# Patient Record
Sex: Female | Born: 1960 | Race: White | Hispanic: No | Marital: Single | Smoking: Never smoker
Health system: Southern US, Community
[De-identification: ages and names within clinical notes are randomized; demographics above are authoritative.]

## PROBLEM LIST (undated history)

## (undated) DIAGNOSIS — K449 Diaphragmatic hernia without obstruction or gangrene: Secondary | ICD-10-CM

## (undated) DIAGNOSIS — K222 Esophageal obstruction: Secondary | ICD-10-CM

## (undated) DIAGNOSIS — F32A Depression, unspecified: Secondary | ICD-10-CM

## (undated) DIAGNOSIS — T8859XA Other complications of anesthesia, initial encounter: Secondary | ICD-10-CM

## (undated) DIAGNOSIS — Z460 Encounter for fitting and adjustment of spectacles and contact lenses: Secondary | ICD-10-CM

## (undated) DIAGNOSIS — F419 Anxiety disorder, unspecified: Secondary | ICD-10-CM

## (undated) DIAGNOSIS — I341 Nonrheumatic mitral (valve) prolapse: Secondary | ICD-10-CM

## (undated) DIAGNOSIS — K589 Irritable bowel syndrome without diarrhea: Secondary | ICD-10-CM

## (undated) DIAGNOSIS — F329 Major depressive disorder, single episode, unspecified: Secondary | ICD-10-CM

## (undated) DIAGNOSIS — IMO0002 Reserved for concepts with insufficient information to code with codable children: Secondary | ICD-10-CM

## (undated) DIAGNOSIS — K644 Residual hemorrhoidal skin tags: Secondary | ICD-10-CM

## (undated) DIAGNOSIS — Z87898 Personal history of other specified conditions: Secondary | ICD-10-CM

## (undated) HISTORY — DX: Major depressive disorder, single episode, unspecified: F32.9

## (undated) HISTORY — DX: Residual hemorrhoidal skin tags: K64.4

## (undated) HISTORY — DX: Irritable bowel syndrome, unspecified: K58.9

## (undated) HISTORY — DX: Esophageal obstruction: K22.2

## (undated) HISTORY — DX: Nonrheumatic mitral (valve) prolapse: I34.1

## (undated) HISTORY — DX: Reserved for concepts with insufficient information to code with codable children: IMO0002

## (undated) HISTORY — PX: TUBAL LIGATION: SHX77

## (undated) HISTORY — DX: Diaphragmatic hernia without obstruction or gangrene: K44.9

## (undated) HISTORY — DX: Depression, unspecified: F32.A

---

## 1997-08-18 HISTORY — PX: LEEP: SHX91

## 1998-02-06 ENCOUNTER — Ambulatory Visit (HOSPITAL_COMMUNITY): Admission: RE | Admit: 1998-02-06 | Discharge: 1998-02-06 | Payer: Self-pay | Admitting: Family Medicine

## 1998-02-14 ENCOUNTER — Ambulatory Visit (HOSPITAL_COMMUNITY): Admission: RE | Admit: 1998-02-14 | Discharge: 1998-02-14 | Payer: Self-pay | Admitting: *Deleted

## 1998-06-20 ENCOUNTER — Other Ambulatory Visit: Admission: RE | Admit: 1998-06-20 | Discharge: 1998-06-20 | Payer: Self-pay | Admitting: *Deleted

## 1998-06-26 ENCOUNTER — Ambulatory Visit (HOSPITAL_COMMUNITY): Admission: RE | Admit: 1998-06-26 | Discharge: 1998-06-26 | Payer: Self-pay | Admitting: Gastroenterology

## 1998-07-13 ENCOUNTER — Other Ambulatory Visit: Admission: RE | Admit: 1998-07-13 | Discharge: 1998-07-13 | Payer: Self-pay | Admitting: *Deleted

## 1998-08-03 ENCOUNTER — Ambulatory Visit (HOSPITAL_COMMUNITY): Admission: RE | Admit: 1998-08-03 | Discharge: 1998-08-03 | Payer: Self-pay | Admitting: *Deleted

## 1998-08-18 DIAGNOSIS — IMO0002 Reserved for concepts with insufficient information to code with codable children: Secondary | ICD-10-CM

## 1998-08-18 DIAGNOSIS — R87619 Unspecified abnormal cytological findings in specimens from cervix uteri: Secondary | ICD-10-CM

## 1998-08-18 HISTORY — DX: Unspecified abnormal cytological findings in specimens from cervix uteri: R87.619

## 1998-08-18 HISTORY — DX: Reserved for concepts with insufficient information to code with codable children: IMO0002

## 1998-09-21 ENCOUNTER — Ambulatory Visit (HOSPITAL_COMMUNITY): Admission: RE | Admit: 1998-09-21 | Discharge: 1998-09-21 | Payer: Self-pay | Admitting: *Deleted

## 1998-09-21 ENCOUNTER — Encounter: Payer: Self-pay | Admitting: *Deleted

## 1998-11-14 ENCOUNTER — Other Ambulatory Visit: Admission: RE | Admit: 1998-11-14 | Discharge: 1998-11-14 | Payer: Self-pay | Admitting: *Deleted

## 1998-12-13 ENCOUNTER — Other Ambulatory Visit: Admission: RE | Admit: 1998-12-13 | Discharge: 1998-12-13 | Payer: Self-pay | Admitting: *Deleted

## 1999-01-11 ENCOUNTER — Ambulatory Visit (HOSPITAL_COMMUNITY): Admission: RE | Admit: 1999-01-11 | Discharge: 1999-01-11 | Payer: Self-pay | Admitting: *Deleted

## 1999-05-30 ENCOUNTER — Other Ambulatory Visit: Admission: RE | Admit: 1999-05-30 | Discharge: 1999-05-30 | Payer: Self-pay | Admitting: *Deleted

## 2000-02-10 ENCOUNTER — Other Ambulatory Visit: Admission: RE | Admit: 2000-02-10 | Discharge: 2000-02-10 | Payer: Self-pay | Admitting: *Deleted

## 2002-03-23 ENCOUNTER — Emergency Department (HOSPITAL_COMMUNITY): Admission: EM | Admit: 2002-03-23 | Discharge: 2002-03-24 | Payer: Self-pay | Admitting: Emergency Medicine

## 2002-06-16 ENCOUNTER — Other Ambulatory Visit: Admission: RE | Admit: 2002-06-16 | Discharge: 2002-06-16 | Payer: Self-pay | Admitting: *Deleted

## 2004-08-18 DIAGNOSIS — I341 Nonrheumatic mitral (valve) prolapse: Secondary | ICD-10-CM

## 2004-08-18 HISTORY — DX: Nonrheumatic mitral (valve) prolapse: I34.1

## 2005-08-14 ENCOUNTER — Other Ambulatory Visit: Admission: RE | Admit: 2005-08-14 | Discharge: 2005-08-14 | Payer: Self-pay | Admitting: Obstetrics and Gynecology

## 2005-08-18 DIAGNOSIS — K222 Esophageal obstruction: Secondary | ICD-10-CM

## 2005-08-18 HISTORY — DX: Esophageal obstruction: K22.2

## 2005-08-18 HISTORY — PX: ESOPHAGEAL DILATION: SHX303

## 2005-09-02 ENCOUNTER — Ambulatory Visit (HOSPITAL_COMMUNITY): Admission: RE | Admit: 2005-09-02 | Discharge: 2005-09-02 | Payer: Self-pay | Admitting: Gastroenterology

## 2005-12-16 HISTORY — PX: CHOLECYSTECTOMY: SHX55

## 2005-12-18 ENCOUNTER — Encounter: Admission: RE | Admit: 2005-12-18 | Discharge: 2005-12-18 | Payer: Self-pay | Admitting: Gastroenterology

## 2006-01-07 ENCOUNTER — Ambulatory Visit (HOSPITAL_COMMUNITY): Admission: RE | Admit: 2006-01-07 | Discharge: 2006-01-07 | Payer: Self-pay | Admitting: Surgery

## 2006-01-07 ENCOUNTER — Encounter (INDEPENDENT_AMBULATORY_CARE_PROVIDER_SITE_OTHER): Payer: Self-pay | Admitting: Pathology

## 2007-10-07 ENCOUNTER — Other Ambulatory Visit: Admission: RE | Admit: 2007-10-07 | Discharge: 2007-10-07 | Payer: Self-pay | Admitting: Obstetrics and Gynecology

## 2008-04-15 ENCOUNTER — Emergency Department (HOSPITAL_COMMUNITY): Admission: EM | Admit: 2008-04-15 | Discharge: 2008-04-16 | Payer: Self-pay | Admitting: Emergency Medicine

## 2008-04-16 ENCOUNTER — Ambulatory Visit: Payer: Self-pay | Admitting: *Deleted

## 2008-04-17 ENCOUNTER — Encounter (INDEPENDENT_AMBULATORY_CARE_PROVIDER_SITE_OTHER): Payer: Self-pay | Admitting: Emergency Medicine

## 2008-04-17 ENCOUNTER — Inpatient Hospital Stay (HOSPITAL_COMMUNITY): Admission: EM | Admit: 2008-04-17 | Discharge: 2008-04-17 | Payer: Self-pay | Admitting: Emergency Medicine

## 2008-04-17 ENCOUNTER — Ambulatory Visit: Payer: Self-pay | Admitting: *Deleted

## 2008-11-27 ENCOUNTER — Encounter: Admission: RE | Admit: 2008-11-27 | Discharge: 2008-11-27 | Payer: Self-pay | Admitting: Internal Medicine

## 2008-12-07 ENCOUNTER — Encounter: Admission: RE | Admit: 2008-12-07 | Discharge: 2008-12-07 | Payer: Self-pay | Admitting: Internal Medicine

## 2009-02-07 ENCOUNTER — Emergency Department (HOSPITAL_COMMUNITY): Admission: EM | Admit: 2009-02-07 | Discharge: 2009-02-07 | Payer: Self-pay | Admitting: Emergency Medicine

## 2010-11-11 ENCOUNTER — Other Ambulatory Visit: Payer: Self-pay | Admitting: Obstetrics & Gynecology

## 2010-11-11 DIAGNOSIS — Z1231 Encounter for screening mammogram for malignant neoplasm of breast: Secondary | ICD-10-CM

## 2010-11-13 ENCOUNTER — Ambulatory Visit
Admission: RE | Admit: 2010-11-13 | Discharge: 2010-11-13 | Disposition: A | Discharge status: Home / Self Care | Payer: BC Managed Care – PPO | Source: Ambulatory Visit | Attending: Obstetrics & Gynecology | Admitting: Obstetrics & Gynecology

## 2010-11-13 DIAGNOSIS — Z1231 Encounter for screening mammogram for malignant neoplasm of breast: Secondary | ICD-10-CM

## 2010-11-25 LAB — DIFFERENTIAL
Basophils Absolute: 0 10*3/uL (ref 0.0–0.1)
Basophils Relative: 0 % (ref 0–1)
Eosinophils Absolute: 0 10*3/uL (ref 0.0–0.7)
Monocytes Relative: 6 % (ref 3–12)
Neutrophils Relative %: 85 % — ABNORMAL HIGH (ref 43–77)

## 2010-11-25 LAB — BASIC METABOLIC PANEL
BUN: 13 mg/dL (ref 6–23)
CO2: 20 mEq/L (ref 19–32)
Calcium: 9.4 mg/dL (ref 8.4–10.5)
Chloride: 108 mEq/L (ref 96–112)
Creatinine, Ser: 0.78 mg/dL (ref 0.4–1.2)
Glucose, Bld: 117 mg/dL — ABNORMAL HIGH (ref 70–99)

## 2010-11-25 LAB — CBC
MCHC: 33.6 g/dL (ref 30.0–36.0)
MCV: 85.4 fL (ref 78.0–100.0)
Platelets: 233 10*3/uL (ref 150–400)
RBC: 4.59 MIL/uL (ref 3.87–5.11)
RDW: 12.8 % (ref 11.5–15.5)

## 2010-12-31 NOTE — H&P (Signed)
NAME:  Nicole Fleming, Nicole Fleming                ACCOUNT NO.:  [id number]   MEDICAL RECORD NO.:  [id number]          PATIENT TYPE:  INP   LOCATION:  4707                         FACILITY:  [facility]   PHYSICIAN:  Lucita Ferrara, MD         DATE OF BIRTH:  1960/10/06   DATE OF ADMISSION:  04/16/2008  DATE OF DISCHARGE:                              HISTORY & PHYSICAL   CHIEF COMPLAINT:  Chest pain, shortness of breath and palpitations.  The  patient is a 50 year old Caucasian female who presents to the [facility] with a chief complaint of wheezing, cough, shortness of  breath.  Sometimes this apparently has been going on for the last 2  days.  The patient presented to her primary care physician and [facility] several days ago, where she was started on albuterol  treatments and also on Zithromax.  The patient's symptoms have  progressed and have not responded to current treatment.  She denies any  fevers or chills.  She denies nausea, vomiting.  Chest pain not  reproducible.  She has no cardiac risk factors with the exception of  mitral valve prolapse.  She denies headaches or focal neurological  deficit; otherwise 12-point review of systems is negative.  In the  emergency room she did have several courses of prednisone and albuterol,  with no relief of her symptoms.   PAST MEDICAL HISTORY:  Significant for asthmatic bronchitis.  She is a  nonsmoker.  She has never been intubated or ventilated in the past for  asthma exacerbations.   PAST SURGICAL HISTORY:  None.   SOCIAL HISTORY:  Denies drugs, alcohol or tobacco.   ALLERGIES:  As stated, allergic to AMOXICILLIN and PEANUTS.   MEDICATIONS:  1. Prednisone.  2. Zithromax.  3. Albuterol.   REVIEW OF SYSTEMS:  As per HPI and otherwise negative.   PHYSICAL EXAMINATION:  Generally speaking the patient is in no acute  distress.  Blood pressure 117/58, pulse 116 after albuterol treatment, respirations  18.  HEENT:   Normocephalic, atraumatic.  Sclerae anicteric.  PERLA,  extraocular movements intact.  NECK:  Supple.  No JVD or carotid bruits.  CARDIOVASCULAR:  S1 and S2, tachy.  ABDOMEN:  Soft, nontender, nondistended.  Positive bowel sounds.  EXTREMITIES:  No clubbing, cyanosis or edema.  NEUROLOGIC:  Patient is alert and oriented x3.  Cranial nerves II-XII  are grossly intact.   EMERGENCY DEPARTMENT COURSE:  The patient continued to wheeze and was  tachypneic and tachycardic, despite treatment.  The emergency room  physician opted to admit the patient for 24 hours, given non response.   EKG:  Shows a pulse of 103 with sinus tachycardia.  No ST-T wave  changes.   LABORATORY DATA:  Basic metabolic panel normal.  CBC within normal  limits, with the exception of mildly high white blood cell count at  11.4.   CHEST X-RAY:  Done 2 days prior on April 15, 2008; shows mild  peribronchial thickening.   ASSESSMENT AND PLAN:  A 50 year old female with;  1. Cough, short of  shortness and wheeze:  Consistent with asthma      exacerbation; not responsive to outpatient or emergency room      treatment.  2. History of mitral valve prolapse.  3. Chest pain that is really atypical and pleuritic in nature, likely      related to #1.  Rule out cardiogenic etiology.   DISCUSSION AND PLAN:  We will go ahead and admit the patient to the  telemetry unit.  We will start Solu-Medrol q.6 h. times 24 hours;  empiric antibiotics with Levaquin, nebulizer treatments q.4 h. and q.2  h. p.r.n. Will cycle cardiac enzymes x3.  We will get a 2-D  echocardiogram, given her history of mitral valve prolapse.  We will go  ahead and plan to discharge the patient home if she is responsive to  above treatments and results are negative.  DVT and GI prophylaxis.  The  rest of the plans are dependent on her progress.      Lucita Ferrara, MD  Electronically Signed     RR/MEDQ  D:  04/17/2008  T:  04/17/2008  Job:  [id number]

## 2011-01-03 NOTE — Op Note (Signed)
Nicole Fleming, PEOPLES                ACCOUNT NO.:  [id number]   MEDICAL RECORD NO.:  [id number]          PATIENT TYPE:  AMB   LOCATION:  SDS                          FACILITY:  [facility]   PHYSICIAN:  Wilmon Arms. Corliss Skains, M.D. DATE OF BIRTH:  January 21, 1961   DATE OF PROCEDURE:  01/07/2006  DATE OF DISCHARGE:                                 OPERATIVE REPORT   PREOPERATIVE DIAGNOSIS:  Right upper quadrant pain.   POSTOPERATIVE DIAGNOSIS:  Right upper quadrant pain.   PROCEDURE PERFORMED:  Laparoscopic cholecystectomy.  Excision of umbilical  skin lesion.   SURGEON:  Wilmon Arms. Corliss Skains, M.D.   ASSISTANT:  Francina Ames, M.D.   ANESTHESIA:  General endotracheal.   INDICATIONS:  The patient is a 50 year old female who presents with several  months of right upper quadrant pain and epigastric pain.  This pain is  usually postprandial and is worse with fried foods or red meat.  There has  been no improvement with change in her diet.  She has significant bloating.  Ultrasound and HIDA scan showed no abnormalities.  She underwent a full of  gastroenterologic workup including an EGD.  She had a slight Schatzki's ring  which was dilated to 17 mm.  No inflammation in the stomach or esophagus.  The patient has a medium-sized hiatal hernia.  After discussion with the  patient and reviewing her symptoms, the decision was made to proceed with an  elective laparoscopic cholecystectomy due to her symptoms.  The patient  understands that there are no definitive tests showing that her gallbladder  was the etiology of her pain, but she understands that there is a  possibility that this might improve her symptoms.   FINDINGS:  Grossly, the gallbladder appeared normal, but there were  adhesions to the surface of the gallbladder.   DESCRIPTION OF PROCEDURE:  The patient brought to the operating room, placed  in the supine position on the operating room table.  After an adequate level  of general endotracheal  anesthesia was obtained, the patient's abdomen was  prepped with Betadine and draped in sterile fashion.  She had been given  preoperative antibiotics as well as pneumatic compression hose.  A time-out  was taken to assure the proper patient, proper procedure.  The patient was  noted to have a dark, pigmented lesion at her umbilicus.  This was an area  where we would normally make our transverse incision.  An elliptical  incision was made around the skin lesion, and it was excised as a full-  thickness skin biopsy and sent for pathologic examination.  Dissection was  carried down the fascia which was opened vertically.  The peritoneal cavity  was bluntly entered.  A stay suture of Vicryl was placed in a pursestring  fashion around the umbilical opening.  The Hasson cannula was inserted and  secured with a stay suture.  Pneumoperitoneum was obtained by insufflating  CO2, maintaining a maximal pressure at 15 mmHg.  The patient was rotated  into reversed Trendelenburg position slightly to her left.  A 10-mm port was  placed in subxiphoid position.  Two 5-mm ports were placed in the right  upper quadrant.  The gallbladder was grasped with a clamp and elevated over  the edge of the liver.  The omentum was adherent to the surface of the  gallbladder.  These adhesions were taken down with cautery and with  scissors.  The cystic duct was identified and was circumferentially  dissected.  This was ligated with a clip distally.  The cystic duct appeared  very thin.  A small opening was then created on the cystic duct.  We  encountered some bleeding from a branch of the cystic artery to the cystic  duct.  This was controlled with cautery.  We were able to insert hook  cautery into the cystic duct but were unable to thread a cholangiogram  catheter.  Due to the fact the patient had no documented stones  preoperatively and with the size disparity of the catheter and the cystic  duct, the decision was  made to not proceed with a cholangiogram.  The cystic  duct was then ligated with clips and divided.  The cystic artery was also  identified, ligated with clips and divided.  Cautery was then used to remove  the gallbladder from the liver bed.  Hemostasis was obtained with cautery.  The right upper quadrant was then thoroughly irrigated, and the irrigant was  suctioned out.  The gallbladder was then removed through the umbilical  fascia.  The pursestring suture was used to close the fascia.  The remainder  of the ports were removed as pneumoperitoneum was released.  Monocryl 4-0  was then used to close skin in subcuticular fashion.  Steri-Strips and clean  dressings were applied.  The patient was extubated and brought to recovery  in stable condition.  All sponge, instrument and needle counts correct.      Wilmon Arms. Tsuei, M.D.  Electronically Signed     MKT/MEDQ  D:  01/07/2006  T:  01/07/2006  Job:  [id number]   cc:   Shirley Friar, MD  Fax: [phone]

## 2011-06-24 ENCOUNTER — Encounter (INDEPENDENT_AMBULATORY_CARE_PROVIDER_SITE_OTHER): Payer: Self-pay | Admitting: Surgery

## 2011-06-25 ENCOUNTER — Encounter (INDEPENDENT_AMBULATORY_CARE_PROVIDER_SITE_OTHER): Payer: Self-pay | Admitting: Surgery

## 2011-06-25 ENCOUNTER — Ambulatory Visit (INDEPENDENT_AMBULATORY_CARE_PROVIDER_SITE_OTHER): Payer: BC Managed Care – PPO | Admitting: Surgery

## 2011-06-25 VITALS — BP 124/88 | HR 66 | Temp 98.3°F | Resp 16 | Ht 69.0 in | Wt 171.4 lb

## 2011-06-25 DIAGNOSIS — R1011 Right upper quadrant pain: Secondary | ICD-10-CM | POA: Insufficient documentation

## 2011-06-25 LAB — COMPREHENSIVE METABOLIC PANEL
ALT: 12 U/L (ref 0–35)
AST: 12 U/L (ref 0–37)
Calcium: 9.6 mg/dL (ref 8.4–10.5)
Chloride: 103 mEq/L (ref 96–112)
Creat: 0.73 mg/dL (ref 0.50–1.10)
Sodium: 138 mEq/L (ref 135–145)
Total Bilirubin: 0.7 mg/dL (ref 0.3–1.2)

## 2011-06-25 LAB — CBC
MCV: 83.4 fL (ref 78.0–100.0)
Platelets: 212 10*3/uL (ref 150–400)
RDW: 13.8 % (ref 11.5–15.5)
WBC: 7.2 10*3/uL (ref 4.0–10.5)

## 2011-06-25 NOTE — Progress Notes (Signed)
Chief Complaint  Patient presents with  . Other    Eval for ventral hernia.    HPI Nicole Fleming is a 50 y.o. female.  This patient underwent laparoscopic cholecystectomy in 2007. She had been doing well for some time. Over the last few months however she has developed some right upper quadrant discomfort. This discomfort is intermittent and occasionally lose to her epigastrium and left upper quadrant. The patient has a history of a Schatzki's ring status post previous dilation by Dr. Charlott Rakes. She has developed more dysphagia recently and has an appointment to see Dr. Bosie Clos for possible repeat dilation. She has not really developed any large mass in her right upper quadrant but does experience some discomfort in this area. She was evaluated by Dr. Cliffton Asters who felt that she might have a small ventral hernia in her right upper quadrant. HPI  Past Medical History  Diagnosis Date  . Asthma     Past Surgical History  Procedure Date  . Cholecystectomy 12/2005  . Cesarean section 1990  . Cesarean section 1992    Family History  Problem Relation Age of Onset  . Heart disease Father 33    cardiac arrest    Social History History  Substance Use Topics  . Smoking status: Never Smoker   . Smokeless tobacco: Never Used  . Alcohol Use: No    Allergies  Allergen Reactions  . Amoxicillin Rash    No current outpatient prescriptions on file.    Review of Systems Review of Systems  Constitutional: Negative for fever, chills and unexpected weight change.  HENT: Negative for hearing loss, congestion, sore throat, trouble swallowing and voice change.   Eyes: Negative for visual disturbance.  Respiratory: Negative for cough and wheezing.   Cardiovascular: Negative for chest pain, palpitations and leg swelling.  Gastrointestinal: Positive for abdominal pain. Negative for nausea, vomiting, diarrhea, constipation, blood in stool, abdominal distention and anal bleeding.    Genitourinary: Negative for hematuria, vaginal bleeding and difficulty urinating.  Musculoskeletal: Negative for arthralgias.  Skin: Negative for rash and wound.  Neurological: Negative for seizures, syncope and headaches.  Hematological: Negative for adenopathy. Does not bruise/bleed easily.  Psychiatric/Behavioral: Negative for confusion.  She does also report some solid food dysphagia  Blood pressure 124/88, pulse 66, temperature 98.3 F (36.8 C), temperature source Temporal, resp. rate 16, height 5\' 9"  (1.753 m), weight 171 lb 6.4 oz (77.747 kg).  Physical Exam Physical Exam WDWN in NAD HEENT:  EOMI, sclera anicteric Neck:  No masses, no thyromegaly Lungs:  CTA bilaterally; normal respiratory effort CV:  Regular rate and rhythm; no murmurs Abd:  +bowel sounds, soft, non-tender, no masses When she is standing, I cannot palpate any type of ventral hernia. RUQ is non-tender with well-healed incisions Ext:  Well-perfused; no edema Skin:  Warm, dry; no sign of jaundice  Data Reviewed None  Assessment    RUQ intermittent discomfort several years s/p lap chole    Plan    We will first check some labs - CMET/ CBC/ Amylase to rule out any type of biliary problem.  If these are normal, then we will proceed with a CT scan to rule out ventral hernia or other etiology for her abdominal pain.  If her LFT's are abnormal, she may need an ultrasound first.  We will schedule her next test after the labs have returned and will see her back in 3 weeks for further discussion       Odaliz Mcqueary K. 06/25/2011,  1:40 PM

## 2011-06-25 NOTE — Patient Instructions (Signed)
We will check some labs today.  If the labs look normal, we will schedule you for a CT scan to evaluate your abdominal pain.  If they are abnormal, we may need to order different tests.  We will contact you after the labs return.

## 2011-06-26 ENCOUNTER — Other Ambulatory Visit (INDEPENDENT_AMBULATORY_CARE_PROVIDER_SITE_OTHER): Payer: Self-pay | Admitting: Surgery

## 2011-06-26 DIAGNOSIS — R1011 Right upper quadrant pain: Secondary | ICD-10-CM

## 2011-06-30 ENCOUNTER — Encounter (INDEPENDENT_AMBULATORY_CARE_PROVIDER_SITE_OTHER): Payer: Self-pay | Admitting: Surgery

## 2011-07-08 ENCOUNTER — Other Ambulatory Visit: Payer: BC Managed Care – PPO

## 2011-07-09 ENCOUNTER — Other Ambulatory Visit: Payer: BC Managed Care – PPO

## 2011-07-15 ENCOUNTER — Encounter (INDEPENDENT_AMBULATORY_CARE_PROVIDER_SITE_OTHER): Payer: BC Managed Care – PPO | Admitting: Surgery

## 2011-08-26 ENCOUNTER — Other Ambulatory Visit: Payer: BC Managed Care – PPO

## 2011-08-27 ENCOUNTER — Telehealth (INDEPENDENT_AMBULATORY_CARE_PROVIDER_SITE_OTHER): Payer: Self-pay | Admitting: General Surgery

## 2011-08-27 NOTE — Telephone Encounter (Signed)
Called pt to see if she is going to re-schedule the CT at 315 and she will. Pt just had foot surgery the week of January 1st and will be out of the country for couple of weeks, once she gets back from the trip she will call me and let me know when her ct will be and I will make her a appt to come back in the see Dr Corliss Skains

## 2011-09-02 ENCOUNTER — Encounter (INDEPENDENT_AMBULATORY_CARE_PROVIDER_SITE_OTHER): Payer: BC Managed Care – PPO | Admitting: Surgery

## 2011-09-30 ENCOUNTER — Telehealth (INDEPENDENT_AMBULATORY_CARE_PROVIDER_SITE_OTHER): Payer: Self-pay | Admitting: Surgery

## 2011-09-30 NOTE — Telephone Encounter (Signed)
The order is still in the system.  Please set up the patient for the ct and she can come back in the office in about 2 to 3 weeks to discuss.

## 2011-10-03 ENCOUNTER — Ambulatory Visit
Admission: RE | Admit: 2011-10-03 | Discharge: 2011-10-03 | Disposition: A | Discharge status: Home / Self Care | Payer: BC Managed Care – PPO | Source: Ambulatory Visit | Attending: Surgery | Admitting: Surgery

## 2011-10-03 DIAGNOSIS — R1011 Right upper quadrant pain: Secondary | ICD-10-CM

## 2011-10-03 MED ORDER — IOHEXOL 300 MG/ML  SOLN: 100.0000 mL | Freq: Once | INTRAMUSCULAR | Status: AC | PRN

## 2011-10-16 ENCOUNTER — Encounter (INDEPENDENT_AMBULATORY_CARE_PROVIDER_SITE_OTHER): Payer: Self-pay | Admitting: Surgery

## 2011-10-16 ENCOUNTER — Ambulatory Visit (INDEPENDENT_AMBULATORY_CARE_PROVIDER_SITE_OTHER): Payer: BC Managed Care – PPO | Admitting: Surgery

## 2011-10-16 VITALS — BP 115/80 | HR 72 | Temp 98.3°F | Resp 18 | Ht 68.0 in | Wt 161.8 lb

## 2011-10-16 DIAGNOSIS — D1739 Benign lipomatous neoplasm of skin and subcutaneous tissue of other sites: Secondary | ICD-10-CM

## 2011-10-16 DIAGNOSIS — D171 Benign lipomatous neoplasm of skin and subcutaneous tissue of trunk: Secondary | ICD-10-CM | POA: Insufficient documentation

## 2011-10-16 MED ORDER — CHOLESTYRAMINE LIGHT 4 G PO PACK: 4.0000 g | PACK | Freq: Two times a day (BID) | ORAL | Status: DC

## 2011-10-16 NOTE — Progress Notes (Signed)
The patient returns for evaluation of her right upper quadrant abdominal pain. She was last seen in November.  At that time we obtain liver function tests and CBC which were overall normal.At that time I ordered a CT scan of the abdomen and pelvis to rule out any problems with her cholecystectomy and also rule out ventral hernia. The patient did not have the CT scan done until February 15. This scan showed a small hemangioma in the right hepatic lobe, no sign of bowel structures or appendicitis, left ovarian cyst. The patient has 2 main complaints today. She has a palpable mass in her right upper quadrant which has become fairly uncomfortable and tender. This is slightly enlarged from November. Her other complaint is that when she eats any food with grease or a lot of seasoning she develops postprandial diarrhea.  Filed Vitals:   10/16/11 1053  BP: 115/80  Pulse: 72  Temp: 98.3 F (36.8 C)  Resp: 18    On examination, the patient's abdomen is soft, non-distended, non-tender.  Just below her right costal margin, there seems to be a subcutaneous 2 cm mass which seems to be smooth, fairly well-demarcated, with no overlying skin changes.  Imp:  1.  Post-cholecystectomy diarrhea   2.  Right abdominal wall lipoma  Plan:  Questran Light PRN for diarrhea Excision of right abdominal wall in the office under local anesthetic.  The surgical procedure has been discussed with the patient.  Potential risks, benefits, alternative treatments, and expected outcomes have been explained.  All of the patient's questions at this time have been answered.  The likelihood of reaching the patient's treatment goal is good.  The patient understand the proposed surgical procedure and wishes to proceed.   Wilmon Arms. Corliss Skains, MD, [facility]  10/16/2011 11:57 AM

## 2011-10-29 ENCOUNTER — Encounter (INDEPENDENT_AMBULATORY_CARE_PROVIDER_SITE_OTHER): Payer: Self-pay | Admitting: Surgery

## 2011-10-29 ENCOUNTER — Ambulatory Visit (INDEPENDENT_AMBULATORY_CARE_PROVIDER_SITE_OTHER): Payer: BC Managed Care – PPO | Admitting: Surgery

## 2011-10-29 VITALS — BP 120/80 | HR 105 | Temp 98.8°F | Ht 69.0 in | Wt 163.6 lb

## 2011-10-29 DIAGNOSIS — D1739 Benign lipomatous neoplasm of skin and subcutaneous tissue of other sites: Secondary | ICD-10-CM

## 2011-10-29 DIAGNOSIS — D171 Benign lipomatous neoplasm of skin and subcutaneous tissue of trunk: Secondary | ICD-10-CM

## 2011-10-29 HISTORY — PX: OTHER SURGICAL HISTORY: SHX169

## 2011-10-29 NOTE — Progress Notes (Signed)
Lipoma removal - right abdominal wall  The area was prepped with Betadine and anesthetized with 1% lidocaine.  A 1.5 cm transverse incision was made.  Dissection was carried down to the lipoma bluntly.  We dissected completely around the mass and delivered it through the incision.  The wound was inspected for hemostasis and closed with 4-0 Monocryl.  Benzoin and steri-strips were applied.  The patient may remove the dressing and shower over this area on Thursday.  Follow-up PRN  Her bowel movements are better with the Questran.  She is going to follow-up with Dr. Bosie Clos about her GERD symptoms.  Wilmon Arms. Corliss Skains, MD, [facility]  10/29/2011 4:57 PM

## 2011-10-29 NOTE — Patient Instructions (Signed)
Remove dressing in two days, then you may shower over the steri-strips until they come off on their own.

## 2011-11-06 ENCOUNTER — Other Ambulatory Visit: Payer: Self-pay | Admitting: Obstetrics & Gynecology

## 2011-11-06 DIAGNOSIS — Z1231 Encounter for screening mammogram for malignant neoplasm of breast: Secondary | ICD-10-CM

## 2011-11-13 ENCOUNTER — Encounter (INDEPENDENT_AMBULATORY_CARE_PROVIDER_SITE_OTHER): Payer: BC Managed Care – PPO | Admitting: Surgery

## 2011-12-03 ENCOUNTER — Ambulatory Visit
Admission: RE | Admit: 2011-12-03 | Discharge: 2011-12-03 | Disposition: A | Discharge status: Home / Self Care | Payer: BC Managed Care – PPO | Source: Ambulatory Visit | Attending: Obstetrics & Gynecology | Admitting: Obstetrics & Gynecology

## 2011-12-03 DIAGNOSIS — Z1231 Encounter for screening mammogram for malignant neoplasm of breast: Secondary | ICD-10-CM

## 2011-12-29 ENCOUNTER — Encounter (INDEPENDENT_AMBULATORY_CARE_PROVIDER_SITE_OTHER): Payer: Self-pay | Admitting: Surgery

## 2012-01-23 ENCOUNTER — Encounter (INDEPENDENT_AMBULATORY_CARE_PROVIDER_SITE_OTHER): Payer: BC Managed Care – PPO | Admitting: Surgery

## 2012-01-26 ENCOUNTER — Ambulatory Visit (INDEPENDENT_AMBULATORY_CARE_PROVIDER_SITE_OTHER): Payer: BC Managed Care – PPO | Admitting: Surgery

## 2012-01-26 ENCOUNTER — Encounter (INDEPENDENT_AMBULATORY_CARE_PROVIDER_SITE_OTHER): Payer: Self-pay | Admitting: Surgery

## 2012-01-26 VITALS — BP 119/76 | HR 81 | Temp 98.8°F | Ht 68.0 in | Wt 165.0 lb

## 2012-01-26 DIAGNOSIS — K644 Residual hemorrhoidal skin tags: Secondary | ICD-10-CM

## 2012-01-26 MED ORDER — HYDROCORTISONE ACETATE 25 MG RE SUPP: 25.0000 mg | Freq: Two times a day (BID) | RECTAL | Status: AC

## 2012-01-26 NOTE — Progress Notes (Signed)
This is a former patient of mine who was sent for evaluation by Dr. Leda Quail for external hemorrhoids. The patient has had some hemorrhoids for many years since pregnancy. She denies any renal problems other than occasional itching. She continues to have some irregular loose bowel movements after her cholecystectomy in 2007. This often depends on the fat content of her diet. Occasionally she sees some blood on the toilet paper. She denies any rectal pain. She does notice some loose tissue around the anus. She denies any significant constipation or straining.  On rectal examination there is no sign of perirectal abscess or fistula. She has a significant amount of loose soft tissue around the anus. There is a slight prolapse of the mucosa. She has tight sphincter tone. No significant internal hemorrhoid disease.  Impression: External loose hemorrhoidal tissue with no significant symptoms and slight mucosal prolapse  Recommendations: Since the patient is completely asymptomatic I would be hesitant to recommend hemorrhoidectomy at this time as there is significant pain involved with that type of surgery. We'll try a course of fiber supplements, Tucks pads, and Anusol suppositories. We will recheck her in one month.  Wilmon Arms. Corliss Skains, MD, [facility]  01/26/2012 3:51 PM

## 2012-02-17 ENCOUNTER — Encounter (INDEPENDENT_AMBULATORY_CARE_PROVIDER_SITE_OTHER): Payer: BC Managed Care – PPO | Admitting: Surgery

## 2012-06-09 ENCOUNTER — Emergency Department (HOSPITAL_COMMUNITY): Payer: BC Managed Care – PPO

## 2012-06-09 ENCOUNTER — Encounter (HOSPITAL_COMMUNITY): Payer: Self-pay | Admitting: Emergency Medicine

## 2012-06-09 ENCOUNTER — Observation Stay (HOSPITAL_COMMUNITY)
Admission: EM | Admit: 2012-06-09 | Discharge: 2012-06-10 | Disposition: A | Discharge status: Home / Self Care | Payer: BC Managed Care – PPO | Attending: Internal Medicine | Admitting: Internal Medicine

## 2012-06-09 DIAGNOSIS — I341 Nonrheumatic mitral (valve) prolapse: Secondary | ICD-10-CM | POA: Diagnosis present

## 2012-06-09 DIAGNOSIS — R079 Chest pain, unspecified: Principal | ICD-10-CM | POA: Insufficient documentation

## 2012-06-09 DIAGNOSIS — I200 Unstable angina: Secondary | ICD-10-CM

## 2012-06-09 DIAGNOSIS — R0602 Shortness of breath: Secondary | ICD-10-CM | POA: Insufficient documentation

## 2012-06-09 DIAGNOSIS — I059 Rheumatic mitral valve disease, unspecified: Secondary | ICD-10-CM | POA: Insufficient documentation

## 2012-06-09 LAB — BASIC METABOLIC PANEL
BUN: 14 mg/dL (ref 6–23)
Calcium: 9.6 mg/dL (ref 8.4–10.5)
GFR calc non Af Amer: >90 mL/min (ref >90)
Glucose, Bld: 102 mg/dL — ABNORMAL HIGH (ref 70–99)

## 2012-06-09 LAB — TROPONIN I: Troponin I: <0.3 ng/mL (ref <0.30)

## 2012-06-09 LAB — CBC
HCT: 38.9 % (ref 36.0–46.0)
HCT: 39.3 % (ref 36.0–46.0)
Hemoglobin: 12.6 g/dL (ref 12.0–15.0)
MCH: 27.3 pg (ref 26.0–34.0)
MCHC: 32.4 g/dL (ref 30.0–36.0)
RDW: 12.7 % (ref 11.5–15.5)
WBC: 7.3 10*3/uL (ref 4.0–10.5)

## 2012-06-09 LAB — CREATININE, SERUM
Creatinine, Ser: 0.74 mg/dL (ref 0.50–1.10)
GFR calc Af Amer: >90 mL/min (ref >90)
GFR calc non Af Amer: >90 mL/min (ref >90)

## 2012-06-09 LAB — D-DIMER, QUANTITATIVE: D-Dimer, Quant: 0.29 ug/mL-FEU (ref 0.00–0.48)

## 2012-06-09 MED ORDER — ALBUTEROL SULFATE (5 MG/ML) 0.5% IN NEBU: 2.5000 mg | INHALATION_SOLUTION | RESPIRATORY_TRACT | Status: DC | PRN

## 2012-06-09 MED ORDER — ENOXAPARIN SODIUM 40 MG/0.4ML ~~LOC~~ SOLN
40.0000 mg | SUBCUTANEOUS | Status: DC
  Filled 2012-06-09 (×3): qty 0.4

## 2012-06-09 MED ORDER — ACETAMINOPHEN 650 MG RE SUPP: 650.0000 mg | Freq: Four times a day (QID) | RECTAL | Status: DC | PRN

## 2012-06-09 MED ORDER — NITROGLYCERIN 0.4 MG SL SUBL
SUBLINGUAL_TABLET | SUBLINGUAL | Status: AC
  Administered 2012-06-09 (×2)
  Filled 2012-06-09: qty 25

## 2012-06-09 MED ORDER — MORPHINE SULFATE 2 MG/ML IJ SOLN
1.0000 mg | INTRAMUSCULAR | Status: DC | PRN
  Administered 2012-06-09: 1 mg via INTRAVENOUS
  Filled 2012-06-09: qty 1

## 2012-06-09 MED ORDER — MORPHINE SULFATE 4 MG/ML IJ SOLN
4.0000 mg | Freq: Once | INTRAMUSCULAR | Status: AC
  Administered 2012-06-09: 4 mg via INTRAVENOUS
  Filled 2012-06-09: qty 1

## 2012-06-09 MED ORDER — ONDANSETRON HCL 4 MG/2ML IJ SOLN: 4.0000 mg | Freq: Four times a day (QID) | INTRAMUSCULAR | Status: DC | PRN

## 2012-06-09 MED ORDER — HYDROCODONE-ACETAMINOPHEN 5-325 MG PO TABS: 1.0000 | ORAL_TABLET | ORAL | Status: DC | PRN

## 2012-06-09 MED ORDER — ACETAMINOPHEN 325 MG PO TABS
650.0000 mg | ORAL_TABLET | Freq: Four times a day (QID) | ORAL | Status: DC | PRN
  Administered 2012-06-09: 650 mg via ORAL
  Filled 2012-06-09: qty 2

## 2012-06-09 MED ORDER — ASPIRIN EC 325 MG PO TBEC
325.0000 mg | DELAYED_RELEASE_TABLET | Freq: Every day | ORAL | Status: DC
  Administered 2012-06-10: 325 mg via ORAL
  Filled 2012-06-09 (×2): qty 1

## 2012-06-09 MED ORDER — NITROGLYCERIN 0.4 MG SL SUBL
0.4000 mg | SUBLINGUAL_TABLET | SUBLINGUAL | Status: DC | PRN
  Administered 2012-06-09: 0.4 mg via SUBLINGUAL
  Filled 2012-06-09: qty 25

## 2012-06-09 MED ORDER — ONDANSETRON HCL 4 MG PO TABS: 4.0000 mg | ORAL_TABLET | Freq: Four times a day (QID) | ORAL | Status: DC | PRN

## 2012-06-09 MED ORDER — NITROGLYCERIN 2 % TD OINT
1.0000 [in_us] | TOPICAL_OINTMENT | Freq: Once | TRANSDERMAL | Status: DC
  Filled 2012-06-09: qty 30

## 2012-06-09 MED ORDER — ALUM & MAG HYDROXIDE-SIMETH 200-200-20 MG/5ML PO SUSP: 30.0000 mL | Freq: Four times a day (QID) | ORAL | Status: DC | PRN

## 2012-06-09 NOTE — ED Provider Notes (Signed)
  Physical Exam  BP 140/85  Pulse 92  Temp 98.5 F (36.9 C) (Oral)  Resp 16  SpO2 96%  Physical Exam  ED Course  Procedures  MDM Chest pain has returned and patient's low longer CDU candidate. She will now be admitted to medicine.      Juliet Rude. Rubin Payor, MD 06/09/12 1734

## 2012-06-09 NOTE — ED Provider Notes (Signed)
History     CSN: [id number]  Arrival date & time 06/09/12  1348   First MD Initiated Contact with Patient 06/09/12 1413      Chief Complaint  Patient presents with  . Chest Pain    (Consider location/radiation/quality/duration/timing/severity/associated sxs/prior treatment) HPI Comments: 51 y/o female presents to the ED from her PCP's office complaining of sudden onset chest pain last night while outside talking with her boyfriend. She and her boyfriend went to get ice cream and were standing outside of the truck talking. Describes the pain as tight, heavy and sharp radiating to her left shoulder and down her left arm to her elbow rated 3/10. She has not tried any alleviating factors. EMS gave ASA and 1 SL nitro without relief. Pain was still present this morning and has been intermittent throughout the day. Nothing in specific makes this worse. Admits to associated shortness of breath. She has never had chest pain like this before. Went on a trip by car to [place] a week and a half ago. Non smoker. No family hx of heart disease before the age of 10.   The history is provided by the patient.    Past Medical History  Diagnosis Date  . Asthma   . Hemorrhoids, external     Past Surgical History  Procedure Date  . Cholecystectomy 12/2005  . Cesarean section 1990  . Cesarean section 1992  . Lipoma removal 10/29/2011    abdominal wall    Family History  Problem Relation Age of Onset  . Heart disease Father 60    cardiac arrest    History  Substance Use Topics  . Smoking status: Never Smoker   . Smokeless tobacco: Never Used  . Alcohol Use: No    OB History    Grav Para Term Preterm Abortions TAB SAB Ect Mult Living                  Review of Systems  Constitutional: Negative for fever, diaphoresis and appetite change.  HENT: Negative for neck pain and neck stiffness.   Eyes: Negative for visual disturbance.  Respiratory: Positive for chest tightness and shortness  of breath. Negative for cough and wheezing.   Cardiovascular: Positive for chest pain. Negative for leg swelling.  Gastrointestinal: Negative for nausea and vomiting.  Genitourinary: Negative for dysuria and difficulty urinating.  Musculoskeletal: Positive for back pain.  Skin: Negative for pallor.  Neurological: Positive for dizziness. Negative for weakness.  Psychiatric/Behavioral: The patient is not nervous/anxious.     Allergies  Amoxicillin  Home Medications  No current outpatient prescriptions on file.  BP 150/95  Pulse 89  Temp 98.5 F (36.9 C) (Oral)  SpO2 95%  Physical Exam  Constitutional: She is oriented to person, place, and time. She appears well-developed and well-nourished. No distress.  HENT:  Head: Normocephalic and atraumatic.  Eyes: Conjunctivae normal and EOM are normal.  Neck: Normal range of motion. Neck supple.  Cardiovascular: Normal rate, regular rhythm, normal heart sounds and intact distal pulses.        No extremity edema  Pulmonary/Chest: Effort normal and breath sounds normal. She has no wheezes. She has no rales. She exhibits no tenderness.  Abdominal: Soft. Bowel sounds are normal. There is no tenderness.  Musculoskeletal: Normal range of motion. She exhibits no edema.  Neurological: She is alert and oriented to person, place, and time.  Skin: Skin is warm and dry. She is not diaphoretic.  Psychiatric: She has a normal mood  and affect. Her speech is normal and behavior is normal.    ED Course  Procedures (including critical care time)  Labs Reviewed  BASIC METABOLIC PANEL - Abnormal; Notable for the following:    Glucose, Bld 102 (*)     All other components within normal limits  CBC  POCT I-STAT TROPONIN I   Date: 06/09/2012  Rate: 79  Rhythm: normal sinus rhythm  QRS Axis: left  Intervals: normal  ST/T Wave abnormalities: normal  Conduction Disutrbances:none  Narrative Interpretation:   Old EKG Reviewed: changes noted  LAD   Dg Chest 2 View  06/09/2012  *RADIOLOGY REPORT*  Clinical Data: Pain.  Mitral valve prolapse.  Asthma.  CHEST - 2 VIEW  Comparison: 02/07/2009  Findings: Minimal chronic left apical scarring noted.  The lungs appear otherwise clear. Cardiac and mediastinal contours appear unremarkable.  No pleural effusion noted.  IMPRESSION:  1.  No significant thoracic abnormality observed.   Original Report Authenticated By: Dellia Cloud, M.D.      1. Unstable angina       MDM  51 y/o female with no significant PMH presenting with unstable angina. Initial troponin negative. All other labs and xray normal. Pain controlled with morphine. Patient will be moved to CDU for chest pain protocol. Case discussed with Dr. Ethelda Chick who also evaluated patient and agrees with plan of care. Case discussed with Doran Durand, PA-C who will take over care of patient at this time.        Trevor Mace, PA-C 06/09/12 [id number]

## 2012-06-09 NOTE — ED Provider Notes (Signed)
Medical screening examination/treatment/procedure(s) were conducted as a shared visit with non-physician practitioner(s) and myself.  I personally evaluated the patient during the encounter  Doug Sou, MD 06/09/12 2222

## 2012-06-09 NOTE — H&P (Signed)
PATIENT DETAILS Name: Nicole Fleming Age: 51 y.o. Sex: female Date of Birth: 03-12-1961 Admit Date: 06/09/2012 UJW:JXBJY,NWGNFAO S, MD   CHIEF COMPLAINT:  Chest Pain  HPI: Patient is a 51 year old Caucasian female with a past medical history of mitral valve prolapse, bronchial asthma who presents to the ED for evaluation of chest pain. Per patient she was at [facility] when she experienced left-sided chest pain along with shortness of breath around 8 PM yesterday evening. Pain was intermittent, coming on and off through out the night and throughout the day today.patient claims the pain was associated with some shortness of breath, however there was no nausea vomiting. Patient does claim that she lifted some heavy boxes a few days ago. She does have recent history of travel to [place]. She is now being admitted to the hospitalist service for further evaluation and treatment. During my evaluation she is currently chest pain-free.   ALLERGIES:   Allergies  Allergen Reactions  . Amoxicillin Rash    PAST MEDICAL HISTORY: Past Medical History  Diagnosis Date  . Asthma   . Hemorrhoids, external     PAST SURGICAL HISTORY: Past Surgical History  Procedure Date  . Cholecystectomy 12/2005  . Cesarean section 1990  . Cesarean section 1992  . Lipoma removal 10/29/2011    abdominal wall    MEDICATIONS AT HOME: Prior to Admission medications   Not on File    FAMILY HISTORY: Family History  Problem Relation Age of Onset  . Heart disease Father 60    cardiac arrest    SOCIAL HISTORY:  reports that she has never smoked. She has never used smokeless tobacco. She reports that she does not drink alcohol or use illicit drugs.  REVIEW OF SYSTEMS:  Constitutional:   No  weight loss, night sweats,  Fevers, chills, fatigue.  HEENT:    No headaches, Difficulty swallowing,Tooth/dental problems,Sore throat,  No sneezing, itching, ear ache, nasal congestion, post nasal drip,    Cardio-vascular:  Orthopnea, PND, swelling in lower extremities, anasarca,  dizziness, palpitations  GI:  No heartburn, indigestion, abdominal pain, nausea, vomiting, diarrhea, change in  bowel habits, loss of appetite  Resp: No shortness of breath with exertion or at rest.  No excess mucus, no productive cough, No non-productive cough,  No coughing up of blood.No change in color of mucus.No wheezing.No chest wall deformity  Skin:  no rash or lesions.  GU:  no dysuria, change in color of urine, no urgency or frequency.  No flank pain.  Musculoskeletal: No joint pain or swelling.  No decreased range of motion.  No back pain.  Psych: No change in mood or affect. No depression or anxiety.  No memory loss.   PHYSICAL EXAM: Blood pressure 140/85, pulse 92, temperature 98.5 F (36.9 C), temperature source Oral, resp. rate 16, SpO2 96.00%.  General appearance :Awake, alert, not in any distress. Speech Clear. Not toxic Looking HEENT: Atraumatic and Normocephalic, pupils equally reactive to light and accomodation Neck: supple, no JVD. No cervical lymphadenopathy.  Chest:Good air entry bilaterally, no added sounds  CVS: S1 S2 regular, no murmurs.  Abdomen: Bowel sounds present, Non tender and not distended with no gaurding, rigidity or rebound. Extremities: B/L Lower Ext shows no edema, both legs are warm to touch, with  dorsalis pedis pulses palpable. Neurology: Awake alert, and oriented X 3, CN II-XII intact, Non focal, Deep Tendon Reflex-2+ all over, plantar's downgoing B/L, sensory exam is grossly intact.  Skin:No Rash Wounds:N/A  LABS ON ADMISSION:  Basename 06/09/12 1424  NA 140  K 4.4  CL 104  CO2 27  GLUCOSE 102*  BUN 14  CREATININE 0.77  CALCIUM 9.6  MG --  PHOS --   No results found for this basename: AST:2,ALT:2,ALKPHOS:2,BILITOT:2,PROT:2,ALBUMIN:2 in the last 72 hours No results found for this basename: LIPASE:2,AMYLASE:2 in the last 72 hours  Basename  06/09/12 1424  WBC 5.8  NEUTROABS --  HGB 12.6  HCT 38.9  MCV 84.2  PLT 183   No results found for this basename: CKTOTAL:3,CKMB:3,CKMBINDEX:3,TROPONINI:3 in the last 72 hours No results found for this basename: DDIMER:2 in the last 72 hours No components found with this basename: POCBNP:3   RADIOLOGIC STUDIES ON ADMISSION: Dg Chest 2 View  06/09/2012  *RADIOLOGY REPORT*  Clinical Data: Pain.  Mitral valve prolapse.  Asthma.  CHEST - 2 VIEW  Comparison: 02/07/2009  Findings: Minimal chronic left apical scarring noted.  The lungs appear otherwise clear. Cardiac and mediastinal contours appear unremarkable.  No pleural effusion noted.  IMPRESSION:  1.  No significant thoracic abnormality observed.   Original Report Authenticated By: Dellia Cloud, M.D.     ASSESSMENT AND PLAN: Present on Admission:  .Chest pain -admit to telemetry, cycle cardiac enzymes. D-dimer has been ordered, currently pending. -This pain has both typical and atypical features, will get cardiology consult as well-case discussed with Dr. Anne Fu. -Echocardiogram pending  Mitral valve prolapse -await echocardiogram  Further plan will depend as patient's clinical course evolves and further radiologic and laboratory data become available. Patient will be monitored closely.  DVT Prophylaxis: -prophylactic Lovenox  Code Status: -full code  Total time spent for admission equals 45 minutes.  [facility] Pager [phone]  If 7PM-7AM, please contact night-coverage [email or web address] Password [facility] 06/09/2012, 5:31 PM

## 2012-06-09 NOTE — ED Notes (Signed)
Admitting MD at bedside.

## 2012-06-09 NOTE — ED Notes (Signed)
Lab at bedside to collect blood.  

## 2012-06-09 NOTE — Consult Note (Signed)
Admit date: 06/09/2012 Referring Physician  Dr. Jerral Ralph Primary Physician Cala Bradford, MD Primary Cardiologist  Otto Herb Reason for Consultation  Chest pain  HPI: 51 year old with no prior cardiovascular history (although she states that she may have mitral valve prolapse), nondiabetic, no hypertension, nonsmoker whose father had mitral valve replacement at age 6 and then died age 80 from "Vioxx overdose "causing sudden cardiac death (part of a lawsuit) who was admitted via emergency department with chest discomfort. Yesterday evening at approximately 8 PM she was with her boyfriend at [facility] when she began having substernal chest discomfort that was sudden with associated shortness of breath, no nausea with some tingling in her left hand/left arm with pain also radiating to her back but not across her shoulders she states. It lasted approximately one hour duration until she felt better/well enough to drive home. She went home, went to sleep but woke up several times during the night hurting and she was turning over in bed. She also fell shortness of breath intermittently. This morning she felt better and decided to go to work. 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 Once there, Dr. Nicholos Johns  Center to the emergency room via EMS. While here in the emergency room, cardiac markers, EKG are all unremarkable. However, she continued to have episodes of chest pain. She also is complaining of lightheadedness and a sensation as though she was almost going to pass out when standing. She was administered nitroglycerin without much change.    PMH:   Past Medical History  Diagnosis Date  . Asthma   . Hemorrhoids, external     PSH:   Past Surgical History  Procedure Date  . Cholecystectomy 12/2005  . Cesarean section 1990  . Cesarean section 1992  . Lipoma removal 10/29/2011    abdominal wall   Allergies:   Amoxicillin Prior to Admit Meds:   (Not in a hospital admission) Fam HX:    Family History  Problem Relation Age of Onset  . Heart disease Father 44    cardiac arrest   Social HX:    History   Social History  . Marital Status: Divorced    Spouse Name: N/A    Number of Children: N/A  . Years of Education: N/A   Occupational History  . Not on file.   Social History Main Topics  . Smoking status: Never Smoker   . Smokeless tobacco: Never Used  . Alcohol Use: No  . Drug Use: No  . Sexually Active: Not on file   Other Topics Concern  . Not on file   Social History Narrative  . No narrative on file     ROS:  Denies any rashes, syncope, fevers, chills, dysphasia. All 11 ROS were addressed and are negative except what is stated in the HPI  Physical Exam: Blood pressure 137/101, pulse 76, temperature 98.5 F (36.9 C), temperature source Oral, resp. rate 16, SpO2 96.00%.    General: Well developed, well nourished, in no acute distress Head: Eyes PERRLA, No xanthomas.   Normal cephalic and atramatic  Lungs:   Clear bilaterally to auscultation and percussion. Normal respiratory effort. No wheezes, no rales. Heart:   HRRR S1 S2 Pulses are 2+ & equal.            No carotid bruit. No JVD.  No abdominal bruits. No femoral bruits. Abdomen: Bowel sounds are positive, abdomen soft and  non-tender without masses. No hepatosplenomegaly. Msk:  Back normal. Normal strength and tone for age. Extremities:   No clubbing, cyanosis or edema.  DP +1 Neuro: Alert and oriented X 3, non-focal, MAE x 4 GU: Deferred Rectal: Deferred Psych:  Good affect, responds appropriately    Labs:   Lab Results  Component Value Date   WBC 5.8 06/09/2012   HGB 12.6 06/09/2012   HCT 38.9 06/09/2012   MCV 84.2 06/09/2012   PLT 183 06/09/2012    Lab 06/09/12 1424  NA 140  K 4.4  CL 104  CO2 27  BUN 14  CREATININE 0.77  CALCIUM 9.6  PROT --  BILITOT --  ALKPHOS --  ALT --  AST --  GLUCOSE  102*      Radiology:  Dg Chest 2 View  06/09/2012  *RADIOLOGY REPORT*  Clinical Data: Pain.  Mitral valve prolapse.  Asthma.  CHEST - 2 VIEW  Comparison: 02/07/2009  Findings: Minimal chronic left apical scarring noted.  The lungs appear otherwise clear. Cardiac and mediastinal contours appear unremarkable.  No pleural effusion noted.  IMPRESSION:  1.  No significant thoracic abnormality observed.   Original Report Authenticated By: Dellia Cloud, M.D.    Personally viewed.  EKG:  NSR, no ST changes. Personally viewed.   ASSESSMENT/PLAN:   51 -year-old female with chest pain.  1. Chest pain-I agree that she has both atypical as well as possible typical features. She describes a pressure-like sensation at times but she also describes tingling in her left hand, change in her symptoms when she turned in bed as well as symptoms when laying still. This could be a possible musculoskeletal component/pleuritic component/inflammatory. No recent fevers, chills, cough. D-dimer is normal. EKG is unremarkable. Cardiac markers are normal. Continue to observe overnight. If observation processes unremarkable, I will set her up for outpatient stress test. When asked if anxiety could be playing a role, she stated that 4 years ago this would be the case but now she is comfortable with her life.  Donato Schultz, MD  06/09/2012  5:43 PM

## 2012-06-09 NOTE — ED Provider Notes (Signed)
Complains of left anterior chest pain onset 8 PM last night intermittent lasting 30-60 minutes at a time radiating to left shoulder with shortness of breath also intermittent lasting 30 to60 minutes at a time. Presently discomfort is mild.  Doug Sou, MD 06/09/12 2222

## 2012-06-09 NOTE — ED Notes (Signed)
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 RN suggested to go to PCP. 12 lead NSR. PCP wanted pt sent over to enzymes. Pt received 324 ASA at PCP office. EMS started a 20 G in left hand and administered 1 of Nitro. Pt report she is still having CP.

## 2012-06-09 NOTE — ED Notes (Signed)
Pt c/o CP 3/10 mid sternal with radiation to left arm. Pt reports her chest feels tight and she was experiencing some dizziness when she stood up a few times today. Pt reports she doesn't want to be here and is frustrated that her PCP sent her here.

## 2012-06-10 DIAGNOSIS — R079 Chest pain, unspecified: Secondary | ICD-10-CM

## 2012-06-10 LAB — CBC
Hemoglobin: 13 g/dL (ref 12.0–15.0)
MCHC: 32.9 g/dL (ref 30.0–36.0)
Platelets: 169 10*3/uL (ref 150–400)
RDW: 12.8 % (ref 11.5–15.5)

## 2012-06-10 LAB — BASIC METABOLIC PANEL
GFR calc Af Amer: >90 mL/min (ref >90)
GFR calc non Af Amer: >90 mL/min (ref >90)
Glucose, Bld: 101 mg/dL — ABNORMAL HIGH (ref 70–99)
Potassium: 4.1 mEq/L (ref 3.5–5.1)
Sodium: 140 mEq/L (ref 135–145)

## 2012-06-10 LAB — TROPONIN I
Troponin I: <0.3 ng/mL (ref <0.30)
Troponin I: <0.3 ng/mL (ref <0.30)

## 2012-06-10 MED ORDER — IBUPROFEN 200 MG PO TABS: 600.0000 mg | ORAL_TABLET | Freq: Three times a day (TID) | ORAL | Status: DC | PRN

## 2012-06-10 MED ORDER — ALBUTEROL SULFATE HFA 108 (90 BASE) MCG/ACT IN AERS: 2.0000 | INHALATION_SPRAY | Freq: Four times a day (QID) | RESPIRATORY_TRACT | Status: DC | PRN

## 2012-06-10 NOTE — Progress Notes (Signed)
At 2150, Pt stated she had some pressure on her chest similar pain prior to hospitalization. Pain score 2/10; BP 105/72; HR 76; SpO2 94 on RA. Obtained EKG and showed normal sinus rhythm. Gave first nitro sublingual. Pt c/o headache after Nitro was given. Applied 2L oxygen via nasal canula. Pt still c/o chest pain. Gave second nitro dose and morphine 1mg . Notified on-call MD.  Pt stated morphine and nitro made her pain worse. Pt seemed anxious and tearful. Acknowledged her pain and tried to calm her. At 0025, pt is resting on bed and stated pain is gone. Will continue to monitor.

## 2012-06-10 NOTE — Progress Notes (Signed)
  Echocardiogram 2D Echocardiogram has been performed.  Nicole Fleming FRANCES 06/10/2012, 10:51 AM

## 2012-06-10 NOTE — Care Management Note (Signed)
    Page 1 of 1   06/10/2012     10:03:45 AM   CARE MANAGEMENT NOTE 06/10/2012  Patient:  Nicole Fleming, Nicole Fleming   Account Number:  [id number]  Date Initiated:  06/10/2012  Documentation initiated by:  Junius Creamer  Subjective/Objective Assessment:   adm w ch pain     Action/Plan:   lives w husband. pcp dr Aram Beecham white   Anticipated DC Date:     Anticipated DC Plan:        DC Planning Services  CM consult      Choice offered to / List presented to:             Status of service:   Medicare Important Message given?   (If response is "NO", the following Medicare IM given date fields will be blank) Date Medicare IM given:   Date Additional Medicare IM given:    Discharge Disposition:    Per UR Regulation:  Reviewed for med. necessity/level of care/duration of stay  If discussed at Long Length of Stay Meetings, dates discussed:    Comments:  10/24 10a debbie Jedediah Noda rn,bsn [phone]

## 2012-06-10 NOTE — Progress Notes (Signed)
Subjective:  Feels better this AM. Had bout of chest discomfort mild last evening with associated anxiety. Used to take Xanax. No pleuritic component. Now feels well.   Objective:  Vital Signs in the last 24 hours: Temp:  [97.7 F (36.5 C)-98.6 F (37 C)] 97.7 F (36.5 C) (10/24 0452) Pulse Rate:  [68-97] 68  (10/24 0452) Resp:  [16-22] 18  (10/24 0452) BP: (94-150)/(59-101) 94/59 mmHg (10/24 0452) SpO2:  [94 %-98 %] 96 % (10/24 0452) Weight:  [77 kg (169 lb 12.1 oz)] 77 kg (169 lb 12.1 oz) (10/23 1844)  Intake/Output from previous day:     Physical Exam: General: Well developed, well nourished, in no acute distress. Head:  Normocephalic and atraumatic. Lungs: Clear to auscultation and percussion. Heart: Normal S1 and S2.  No murmur, rubs or gallops. Non tender. No rash Abdomen: soft, non-tender, positive bowel sounds. Extremities: No clubbing or cyanosis. No edema. Neurologic: Alert and oriented x 3.    Lab Results:  [facility] 06/10/12 0620 06/09/12 1856  WBC 7.1 7.3  HGB 13.0 12.8  PLT 169 193    Basename 06/10/12 0620 06/09/12 1856 06/09/12 1424  NA 140 -- 140  K 4.1 -- 4.4  CL 103 -- 104  CO2 28 -- 27  GLUCOSE 101* -- 102*  BUN 12 -- 14  CREATININE 0.71 0.74 --    Basename 06/10/12 0620 06/10/12 0010  TROPONINI <0.30 <0.30   Hepatic Function Panel No results found for this basename: PROT,ALBUMIN,AST,ALT,ALKPHOS,BILITOT,BILIDIR,IBILI in the last 72 hours No results found for this basename: CHOL in the last 72 hours No results found for this basename: PROTIME in the last 72 hours  Imaging: Dg Chest 2 View  06/09/2012  *RADIOLOGY REPORT*  Clinical Data: Pain.  Mitral valve prolapse.  Asthma.  CHEST - 2 VIEW  Comparison: 02/07/2009  Findings: Minimal chronic left apical scarring noted.  The lungs appear otherwise clear. Cardiac and mediastinal contours appear unremarkable.  No pleural effusion noted.  IMPRESSION:  1.  No significant thoracic abnormality  observed.   Original Report Authenticated By: Dellia Cloud, M.D.    Personally viewed.   Telemetry: No adverse rhythm Personally viewed.   EKG:  NSR, no ST changes    Assessment/Plan:  Principal Problem:  *Chest pain Active Problems:  Mitral valve prolapse   -Trop normal, ECG unremarkable. Will set up for NUC stress test. Plan is for tomorrow Friday to have this done. Office working on [employer]. With CP last eve, NTG and Morphine made worse.   -Chest pain with both typical and a typical features. Possible MSK or GERD related. Anxiety may be playing a role. If worsens she knows to seek medical attention. ASA 81.   -HTN - BP a bit low. Would consider decreasing dose of Diovan.    NO signs of ACS. OK from my standpoint for DC with close follow up with stress test.   Bueford Arp 06/10/2012, 8:50 AM

## 2012-06-10 NOTE — Discharge Summary (Signed)
Physician Discharge Summary  Nicole Fleming ZOX:[id number] DOB: 1960-10-28 DOA: 06/09/2012  PCP: Cala Bradford, MD  Admit date: 06/09/2012 Discharge date: 06/10/2012  Time spent: 25 minutes  Recommendations for Outpatient Follow-up:  1. Patient will followup with [facility] cardiology tomorrow, 10/25 for stress test  Discharge Diagnoses:  Principal Problem:  *Chest pain Active Problems:  Mitral valve prolapse   Discharge Condition: Stable, being discharged home  Diet recommendation: Regular  Filed Weights   06/09/12 1844  Weight: 77 kg (169 lb 12.1 oz)    History of present illness:  Patient is a 51 year old Caucasian female with a past medical history of mitral valve prolapse, bronchial asthma who presents to the ED for evaluation of chest pain. Per patient she was at [facility] when she experienced left-sided chest pain along with shortness of breath around 8 PM on the evening of 10/22. Pain was intermittent, coming on and off through out the night and throughout the day today.patient claims the pain was associated with some shortness of breath, however there was no nausea vomiting.    Hospital Course:  Patient was admitted to the hospitalist service. EKG noted sinus rhythm with occasional sinus arrhythmia. D-dimer was normal. Otherwise stable. Enzymes were cycled and were negative. Vital signs are stable. Cardiology evaluated the patient and felt this was likely not cardiac and recommended monitoring overnight with discharge and outpatient stress test. Patient has been previously evaluated for this before and I suspected that her symptoms are either related to some bronchospasm versus costochondritis. Patient does have a previous history of both. By the following day, she was feeling a little bit better. Reality followed up in the setting of the stress test for 06/11/12. They recommended stable for discharge and did not feel echo was warranted. Am discharging her home with  prescription for albuterol inhaler when necessary as well as recommending when necessary Motrin for possible costochondritis.  Procedures:  None  Consultations:  Donato Schultz, Deboraha Sprang cardiology  Discharge Exam: Filed Vitals:   06/09/12 2003 06/09/12 2150 06/09/12 2205 06/10/12 0452  BP: 123/78 105/72 110/68 94/59  Pulse:    68  Temp:  98.6 F (37 C)  97.7 F (36.5 C)  TempSrc:  Oral  Oral  Resp:    18  Height:      Weight:      SpO2:  94% 97% 96%    General: Alert and oriented x3, mildly anxious, looks younger than stated age, fatigued Cardiovascular: Regular rate and rhythm, S1-S2 Respiratory: Clear to auscultation bilaterally Abdomen: Soft, nontender, nondistended, positive bowel sounds Extremities: No clubbing or cyanosis or edema  Discharge Instructions  Discharge Orders    Future Orders Please Complete By Expires   Diet general      Increase activity slowly          Medication List     As of 06/10/2012 10:30 AM    TAKE these medications         albuterol 108 (90 BASE) MCG/ACT inhaler   Commonly known as: PROVENTIL HFA;VENTOLIN HFA   Inhale 2 puffs into the lungs every 6 (six) hours as needed for wheezing.      ibuprofen 200 MG tablet   Commonly known as: ADVIL,MOTRIN   Take 3 tablets (600 mg total) by mouth every 8 (eight) hours as needed for pain (Stop after 2-3 days).           Follow-up Information    Follow up with Donato Schultz, MD. (STRESS TEST TOMORROW -  OFFICE WILL CONTACT ON TIMING.)    Contact information:   [address] [place] [address] [phone]       Follow up with Cala Bradford, MD. In 1 month. (As needed)    Contact information:   [address] [place] [address] [phone]           The results of significant diagnostics from this hospitalization (including imaging, microbiology, ancillary and laboratory) are listed below for reference.    Significant Diagnostic Studies: Dg Chest 2  View  06/09/2012   IMPRESSION:  1.  No significant thoracic abnormality observed.   Original Report Authenticated By: Dellia Cloud, M.D.     Labs: Basic Metabolic Panel:  Lab 06/10/12 5784 06/09/12 1856 06/09/12 1424  NA 140 -- 140  K 4.1 -- 4.4  CL 103 -- 104  CO2 28 -- 27  GLUCOSE 101* -- 102*  BUN 12 -- 14  CREATININE 0.71 0.74 0.77  CALCIUM 9.5 -- 9.6  MG -- -- --  PHOS -- -- --   CBC:  Lab 06/10/12 0620 06/09/12 1856 06/09/12 1424  WBC 7.1 7.3 5.8  NEUTROABS -- -- --  HGB 13.0 12.8 12.6  HCT 39.5 39.3 38.9  MCV 83.3 83.4 84.2  PLT 169 193 183   Cardiac Enzymes:  Lab 06/10/12 0620 06/10/12 0010 06/09/12 1857  CKTOTAL -- -- --  CKMB -- -- --  CKMBINDEX -- -- --  TROPONINI <0.30 <0.30 <0.30      Signed:  Hollice Espy  [facility] 06/10/2012, 10:30 AM

## 2012-06-10 NOTE — Progress Notes (Signed)
Discharge instructions given to pt. Pt verbalized understanding of costochondritis. Pt is stable for discharge.

## 2012-07-09 ENCOUNTER — Telehealth (INDEPENDENT_AMBULATORY_CARE_PROVIDER_SITE_OTHER): Payer: Self-pay | Admitting: General Surgery

## 2012-07-09 NOTE — Telephone Encounter (Signed)
Pt states she has seen Dr. Corliss Skains in the past for external hems that are presenting a hygiene problem.  Today she had a prolonged bleeding from them, so wants to discuss surgery options with Dr. Corliss Skains.  Appt made for 07/26/12.

## 2012-07-26 ENCOUNTER — Encounter (INDEPENDENT_AMBULATORY_CARE_PROVIDER_SITE_OTHER): Payer: BC Managed Care – PPO | Admitting: Surgery

## 2012-11-04 ENCOUNTER — Other Ambulatory Visit: Payer: Self-pay

## 2012-11-04 DIAGNOSIS — Z1231 Encounter for screening mammogram for malignant neoplasm of breast: Secondary | ICD-10-CM

## 2012-12-28 ENCOUNTER — Encounter: Payer: Self-pay | Admitting: Obstetrics & Gynecology

## 2012-12-30 ENCOUNTER — Ambulatory Visit (INDEPENDENT_AMBULATORY_CARE_PROVIDER_SITE_OTHER): Payer: BC Managed Care – PPO | Admitting: Obstetrics & Gynecology

## 2012-12-30 ENCOUNTER — Encounter: Payer: Self-pay | Admitting: Obstetrics & Gynecology

## 2012-12-30 ENCOUNTER — Ambulatory Visit
Admission: RE | Admit: 2012-12-30 | Discharge: 2012-12-30 | Disposition: A | Discharge status: Home / Self Care | Payer: BC Managed Care – PPO | Source: Ambulatory Visit

## 2012-12-30 VITALS — BP 118/80 | Ht 68.0 in | Wt 174.2 lb

## 2012-12-30 DIAGNOSIS — Z01419 Encounter for gynecological examination (general) (routine) without abnormal findings: Secondary | ICD-10-CM

## 2012-12-30 DIAGNOSIS — Z1231 Encounter for screening mammogram for malignant neoplasm of breast: Secondary | ICD-10-CM

## 2012-12-30 DIAGNOSIS — Z124 Encounter for screening for malignant neoplasm of cervix: Secondary | ICD-10-CM

## 2012-12-30 DIAGNOSIS — D239 Other benign neoplasm of skin, unspecified: Secondary | ICD-10-CM

## 2012-12-30 DIAGNOSIS — D229 Melanocytic nevi, unspecified: Secondary | ICD-10-CM

## 2012-12-30 DIAGNOSIS — Z0000 Encounter for general adult medical examination without abnormal findings: Secondary | ICD-10-CM

## 2012-12-30 DIAGNOSIS — K649 Unspecified hemorrhoids: Secondary | ICD-10-CM

## 2012-12-30 LAB — POCT URINALYSIS DIPSTICK
Blood, UA: NEGATIVE
Nitrite, UA: NEGATIVE
Protein, UA: NEGATIVE
Urobilinogen, UA: NEGATIVE
pH, UA: 7

## 2012-12-30 LAB — LIPID PANEL
LDL Cholesterol: 135 mg/dL — ABNORMAL HIGH (ref 0–99)
VLDL: 32 mg/dL (ref 0–40)

## 2012-12-30 NOTE — Patient Instructions (Signed)

## 2012-12-30 NOTE — Progress Notes (Signed)
52 y.o. G2P2 DivorcedCaucasianF here for annual exam.  No VB.  Occassional hot flashes.  Not bad.  No vaginal dryness.  Dating new person for about three months.  Donates plasma--went last week.  GC/Chl, HIV, RPR one year ago.    Went to ER due to chest pain.  Had full evaluation EPIC.  I reviewed today with patient.  Had done a lot of yard work.  Diagnosis was costochondritis.  Symptoms have completely resolved.    Wants me to look at two dark moles on abdomen.  She also reports hemorrhoids are causing more problems.  She has had at least three episodes of bright red rectal bleeding like a period.  Blood drips in toilet and makes water look like it is full of blood.  Patient's last menstrual period was 03/05/2012.          Sexually active: yes  The current method of family planning is tubal ligation and IUD   Exercising: yes  sometimes walking, bike riding, and swimming Smoker:  no  Health Maintenance: Pap:  12/03/11 WNL/negative HR HPV History of abnormal Pap:  Yes LEEP 1999  MMG:  12/03/11 normal, scheduled today Colonoscopy:  11/09, hyperplastic polyp BMD:   ?2009  TDaP:  unsure Screening Labs: lipid due today, Hb today: donated plasma 5/14, Urine today: normal   reports that she has never smoked. She has never used smokeless tobacco. She reports that she does not drink alcohol or use illicit drugs.  Past Medical History  Diagnosis Date  . Asthma   . Hemorrhoids, external   . Abnormal pap 2000    h/o LEEP  . Schatzki's ring 1/07    of esophagus  . Depression     and anxiety  . IBS (irritable bowel syndrome)     questionable gluten allergy  . Mitral valve prolapse 2006    Past Surgical History  Procedure Laterality Date  . Cholecystectomy  12/2005  . Cesarean section  1990  . Cesarean section  1992  . Lipoma removal  10/29/2011    abdominal wall  . Tubal ligation    . Esophageal dilation  1/07  . Cyst excision  2013    benign cyst removed from abdomen    Current  Outpatient Prescriptions  Medication Sig Dispense Refill  . albuterol (PROVENTIL HFA;VENTOLIN HFA) 108 (90 BASE) MCG/ACT inhaler Inhale 2 puffs into the lungs every 6 (six) hours as needed for wheezing.  1 Inhaler  2  . ibuprofen (MOTRIN IB) 200 MG tablet Take 3 tablets (600 mg total) by mouth every 8 (eight) hours as needed for pain (Stop after 2-3 days).  30 tablet  0  . Levonorgestrel (MIRENA IU) by Intrauterine route.      . loratadine (CLARITIN) 10 MG tablet Take 10 mg by mouth daily. As needed       No current facility-administered medications for this visit.    Family History  Problem Relation Age of Onset  . Heart disease Father 24    cardiac arrest/mitral valve replaced    ROS:  Pertinent items are noted in HPI.  Otherwise, a comprehensive ROS was negative.  Exam:   BP 118/80  Ht 5\' 8"  (1.727 m)  Wt 174 lb 3.2 oz (79.017 kg)  BMI 26.49 kg/m2  LMP 03/05/2012  Weight change: +8lbs   Height: 5\' 8"  (172.7 cm)  Ht Readings from Last 3 Encounters:  12/30/12 5\' 8"  (1.727 m)  06/09/12 5\' 9"  (1.753 m)  01/26/12 5\' 8"  (  1.727 m)    General appearance: alert, cooperative and appears stated age Head: Normocephalic, without obvious abnormality, atraumatic Neck: no adenopathy, supple, symmetrical, trachea midline and thyroid normal to inspection and palpation Breasts: normal appearance, no masses or tenderness Abdomen: soft, non-tender; bowel sounds normal; no masses,  no organomegaly Extremities: extremities normal, atraumatic, no cyanosis or edema Skin: Skin color, texture, turgor normal. No rashes.  Two dark moles on her abdomen. Lymph nodes: Cervical, supraclavicular, and axillary nodes normal. No abnormal inguinal nodes palpated Neurologic: Grossly normal   Pelvic: External genitalia:  no lesions              Urethra:  normal appearing urethra with no masses, tenderness or lesions              Bartholins and Skenes: normal                 Vagina: normal appearing vagina  with normal color and discharge, no lesions              Cervix: no lesions and IUD string seen              Pap taken: yes Bimanual Exam:  Uterus:  normal size, contour, position, consistency, mobility, non-tender              Adnexa: normal adnexa and no mass, fullness, tenderness               Rectovaginal: Confirms               Anus:  normal sphincter tone, no lesions  A:  Well Woman with normal exam Hemorrhoids H/O LEEP 1999 Dark moles Mirena IUD 4/11  P:   Mammogram scheduled today pap smear.  H/O LEEP. Refer back to Dr. Corliss Skains.  Pt ready for hemorrhoidectomy. Refer to Dermatology. FLP today. return annually or prn  An After Visit Summary was printed and given to the patient.

## 2013-01-03 LAB — IPS PAP SMEAR ONLY

## 2013-01-06 ENCOUNTER — Telehealth: Payer: Self-pay | Admitting: *Deleted

## 2013-01-06 NOTE — Telephone Encounter (Signed)
Called [facility] and spoke to the triage nurse she stated that they are scheduling out into October I called the patient and asked her would she like me to try another office she said that she is having hemorrhoid surgery soon and she will ask if they can remove the two moles at that time as that was what they did when she had her gallbladder removed. She did say she would let us know either way.

## 2013-01-13 ENCOUNTER — Telehealth: Payer: Self-pay | Admitting: Orthopedic Surgery

## 2013-01-13 NOTE — Telephone Encounter (Signed)
LM on pt's VM confirming name re: appt at [facility] 01-20-13 at 9:20 with Dr. Corliss Skains. Phone number given to reschedule. Also appt with [facility] 02-10-13 at 11:00. Phone and address given. Pt to call back with any questions.

## 2013-01-20 ENCOUNTER — Ambulatory Visit (INDEPENDENT_AMBULATORY_CARE_PROVIDER_SITE_OTHER): Payer: BC Managed Care – PPO | Admitting: Surgery

## 2013-01-20 ENCOUNTER — Encounter (INDEPENDENT_AMBULATORY_CARE_PROVIDER_SITE_OTHER): Payer: Self-pay | Admitting: Surgery

## 2013-01-20 VITALS — BP 128/78 | HR 72 | Temp 97.7°F | Resp 16 | Ht 68.0 in | Wt 175.6 lb

## 2013-01-20 DIAGNOSIS — L989 Disorder of the skin and subcutaneous tissue, unspecified: Secondary | ICD-10-CM | POA: Insufficient documentation

## 2013-01-20 DIAGNOSIS — K644 Residual hemorrhoidal skin tags: Secondary | ICD-10-CM

## 2013-01-20 NOTE — Progress Notes (Signed)
Patient ID: Nicole Fleming, female   DOB: 1960-09-29, 52 y.o.   MRN: [id number]  Chief Complaint  Patient presents with  . Follow-up    reck hems    HPI Nicole Fleming is a 52 y.o. female.  PCP - Dr. Leda Quail HPI 52 year old female presents with worsening external hemorrhoids. The patient has some irritable bowel syndrome and alternates between constipation and diarrhea. She was seen last year for similar problem but seems to have gotten worse. Indications to see some blood on the toilet paper. She has some discomfort around her anus. She presents now for surgical evaluation. The patient also has 2 pigmented skin lesions on her abdominal wall which Dr. Hyacinth Meeker has expressed some concern about. She is requesting a biopsy of these.  Past Medical History  Diagnosis Date  . Asthma   . Hemorrhoids, external   . Abnormal pap 2000    h/o LEEP  . Schatzki's ring 1/07    of esophagus  . Depression     and anxiety  . IBS (irritable bowel syndrome)     questionable gluten allergy  . Mitral valve prolapse 2006    Past Surgical History  Procedure Laterality Date  . Cholecystectomy  12/2005  . Cesarean section  1990  . Cesarean section  1992  . Lipoma removal  10/29/2011    abdominal wall  . Tubal ligation    . Esophageal dilation  1/07  . Cyst excision  2013    benign cyst removed from abdomen    Family History  Problem Relation Age of Onset  . Heart disease Father 82    cardiac arrest/mitral valve replaced    Social History History  Substance Use Topics  . Smoking status: Never Smoker   . Smokeless tobacco: Never Used  . Alcohol Use: No    Allergies  Allergen Reactions  . Amoxicillin Rash    Current Outpatient Prescriptions  Medication Sig Dispense Refill  . albuterol (PROVENTIL HFA;VENTOLIN HFA) 108 (90 BASE) MCG/ACT inhaler Inhale 2 puffs into the lungs every 6 (six) hours as needed for wheezing.  1 Inhaler  2  . ibuprofen (MOTRIN IB) 200 MG tablet Take 3 tablets  (600 mg total) by mouth every 8 (eight) hours as needed for pain (Stop after 2-3 days).  30 tablet  0  . Levonorgestrel (MIRENA IU) by Intrauterine route.      . loratadine (CLARITIN) 10 MG tablet Take 10 mg by mouth daily. As needed       No current facility-administered medications for this visit.    Review of Systems Review of Systems  Constitutional: Negative for fever, chills and unexpected weight change.  HENT: Negative for hearing loss, congestion, sore throat, trouble swallowing and voice change.   Eyes: Negative for visual disturbance.  Respiratory: Negative for cough and wheezing.   Cardiovascular: Negative for chest pain, palpitations and leg swelling.  Gastrointestinal: Positive for diarrhea, constipation, anal bleeding and rectal pain. Negative for nausea, vomiting, abdominal pain, blood in stool and abdominal distention.  Genitourinary: Negative for hematuria, vaginal bleeding and difficulty urinating.  Musculoskeletal: Negative for arthralgias.  Skin: Negative for rash and wound.  Neurological: Negative for seizures, syncope and headaches.  Hematological: Negative for adenopathy. Does not bruise/bleed easily.  Psychiatric/Behavioral: Negative for confusion.    Blood pressure 128/78, pulse 72, temperature 97.7 F (36.5 C), temperature source Temporal, resp. rate 16, height 5\' 8"  (1.727 m), weight 175 lb 9.6 oz (79.652 kg), last menstrual period 03/05/2012.  Physical Exam Physical Exam WDWN in NAD HEENT:  EOMI, sclera anicteric Neck:  No masses, no thyromegaly Lungs:  CTA bilaterally; normal respiratory effort CV:  Regular rate and rhythm; no murmurs Abd:  Left side - two 3 mm pigmented skin lesions; have become darker, but not enlarged Rectal: large loose external hemorrhoids - right anterior/ left lateral; good sphincter tone; no fissure Ext:  Well-perfused; no edema Skin:  Warm, dry; no sign of jaundice  Data Reviewed none  Assessment    Enlarging external  hemorrhoids Two pigmented skin lesions - left abdominal wall       Plan    Skin biopsy of abdominal wall skin lesions Hemorrhoidectomy.  The surgical procedure has been discussed with the patient.  Potential risks, benefits, alternative treatments, and expected outcomes have been explained.  All of the patient's questions at this time have been answered.  The likelihood of reaching the patient's treatment goal is good.  The patient understand the proposed surgical procedure and wishes to proceed.         Jairus Tonne K. 01/20/2013, 1:24 PM

## 2013-02-24 ENCOUNTER — Encounter (HOSPITAL_BASED_OUTPATIENT_CLINIC_OR_DEPARTMENT_OTHER): Payer: Self-pay | Admitting: *Deleted

## 2013-02-24 NOTE — Progress Notes (Signed)
No labs needed

## 2013-03-01 ENCOUNTER — Encounter (HOSPITAL_BASED_OUTPATIENT_CLINIC_OR_DEPARTMENT_OTHER)
Admission: RE | Disposition: A | Discharge status: Home / Self Care | Payer: Self-pay | Source: Ambulatory Visit | Attending: Surgery

## 2013-03-01 ENCOUNTER — Encounter (HOSPITAL_BASED_OUTPATIENT_CLINIC_OR_DEPARTMENT_OTHER): Payer: Self-pay

## 2013-03-01 ENCOUNTER — Ambulatory Visit (HOSPITAL_BASED_OUTPATIENT_CLINIC_OR_DEPARTMENT_OTHER)
Admission: RE | Admit: 2013-03-01 | Discharge: 2013-03-01 | Disposition: A | Discharge status: Home / Self Care | Payer: BC Managed Care – PPO | Source: Ambulatory Visit | Attending: Surgery | Admitting: Surgery

## 2013-03-01 ENCOUNTER — Encounter (HOSPITAL_BASED_OUTPATIENT_CLINIC_OR_DEPARTMENT_OTHER): Payer: Self-pay | Admitting: Anesthesiology

## 2013-03-01 ENCOUNTER — Ambulatory Visit (HOSPITAL_BASED_OUTPATIENT_CLINIC_OR_DEPARTMENT_OTHER): Payer: BC Managed Care – PPO | Admitting: Anesthesiology

## 2013-03-01 DIAGNOSIS — F3289 Other specified depressive episodes: Secondary | ICD-10-CM | POA: Insufficient documentation

## 2013-03-01 DIAGNOSIS — F329 Major depressive disorder, single episode, unspecified: Secondary | ICD-10-CM | POA: Insufficient documentation

## 2013-03-01 DIAGNOSIS — J45909 Unspecified asthma, uncomplicated: Secondary | ICD-10-CM | POA: Insufficient documentation

## 2013-03-01 DIAGNOSIS — D235 Other benign neoplasm of skin of trunk: Secondary | ICD-10-CM | POA: Insufficient documentation

## 2013-03-01 DIAGNOSIS — K644 Residual hemorrhoidal skin tags: Secondary | ICD-10-CM

## 2013-03-01 DIAGNOSIS — L819 Disorder of pigmentation, unspecified: Secondary | ICD-10-CM | POA: Insufficient documentation

## 2013-03-01 DIAGNOSIS — K648 Other hemorrhoids: Secondary | ICD-10-CM | POA: Insufficient documentation

## 2013-03-01 DIAGNOSIS — F411 Generalized anxiety disorder: Secondary | ICD-10-CM | POA: Insufficient documentation

## 2013-03-01 HISTORY — PX: SKIN BIOPSY: SHX1

## 2013-03-01 HISTORY — DX: Encounter for fitting and adjustment of spectacles and contact lenses: Z46.0

## 2013-03-01 HISTORY — PX: HEMORRHOID SURGERY: SHX153

## 2013-03-01 SURGERY — HEMORRHOIDECTOMY
Anesthesia: General | Site: Abdomen | Wound class: Contaminated

## 2013-03-01 MED ORDER — PROPOFOL 10 MG/ML IV BOLUS
INTRAVENOUS | Status: DC | PRN
  Administered 2013-03-01: 200 mg via INTRAVENOUS

## 2013-03-01 MED ORDER — MEPERIDINE HCL 25 MG/ML IJ SOLN: 6.2500 mg | INTRAMUSCULAR | Status: DC | PRN

## 2013-03-01 MED ORDER — DEXAMETHASONE SODIUM PHOSPHATE 4 MG/ML IJ SOLN
INTRAMUSCULAR | Status: DC | PRN
  Administered 2013-03-01: 8 mg via INTRAVENOUS

## 2013-03-01 MED ORDER — CEFAZOLIN SODIUM-DEXTROSE 2-3 GM-% IV SOLR
INTRAVENOUS | Status: DC | PRN
  Administered 2013-03-01: 2 g via INTRAVENOUS

## 2013-03-01 MED ORDER — LIDOCAINE HCL (CARDIAC) 20 MG/ML IV SOLN
INTRAVENOUS | Status: DC | PRN
  Administered 2013-03-01: 75 mg via INTRAVENOUS

## 2013-03-01 MED ORDER — FENTANYL CITRATE 0.05 MG/ML IJ SOLN
INTRAMUSCULAR | Status: DC | PRN
  Administered 2013-03-01 (×3): 50 ug via INTRAVENOUS

## 2013-03-01 MED ORDER — CHLORHEXIDINE GLUCONATE 4 % EX LIQD: 1.0000 "application " | Freq: Once | CUTANEOUS | Status: DC

## 2013-03-01 MED ORDER — OXYCODONE HCL 5 MG/5ML PO SOLN: 5.0000 mg | Freq: Once | ORAL | Status: AC | PRN

## 2013-03-01 MED ORDER — LACTATED RINGERS IV SOLN
INTRAVENOUS | Status: DC
  Administered 2013-03-01 (×2): via INTRAVENOUS

## 2013-03-01 MED ORDER — FENTANYL CITRATE 0.05 MG/ML IJ SOLN
25.0000 ug | INTRAMUSCULAR | Status: DC | PRN
  Administered 2013-03-01 (×2): 50 ug via INTRAVENOUS

## 2013-03-01 MED ORDER — MIDAZOLAM HCL 5 MG/5ML IJ SOLN
INTRAMUSCULAR | Status: DC | PRN
  Administered 2013-03-01: 2 mg via INTRAVENOUS

## 2013-03-01 MED ORDER — BUPIVACAINE LIPOSOME 1.3 % IJ SUSP
INTRAMUSCULAR | Status: DC | PRN
  Administered 2013-03-01: 20 mL

## 2013-03-01 MED ORDER — MIDAZOLAM HCL 2 MG/2ML IJ SOLN: 0.5000 mg | Freq: Once | INTRAMUSCULAR | Status: DC | PRN

## 2013-03-01 MED ORDER — PROMETHAZINE HCL 25 MG/ML IJ SOLN: 6.2500 mg | INTRAMUSCULAR | Status: DC | PRN

## 2013-03-01 MED ORDER — OXYCODONE-ACETAMINOPHEN 5-325 MG PO TABS: 1.0000 | ORAL_TABLET | ORAL | Status: DC | PRN

## 2013-03-01 MED ORDER — ONDANSETRON HCL 4 MG/2ML IJ SOLN
INTRAMUSCULAR | Status: DC | PRN
  Administered 2013-03-01: 4 mg via INTRAVENOUS

## 2013-03-01 MED ORDER — OXYCODONE HCL 5 MG PO TABS
5.0000 mg | ORAL_TABLET | Freq: Once | ORAL | Status: AC | PRN
  Administered 2013-03-01: 5 mg via ORAL

## 2013-03-01 SURGICAL SUPPLY — 61 items
ADH SKN CLS APL DERMABOND .7 (GAUZE/BANDAGES/DRESSINGS) ×2
APL SKNCLS STERI-STRIP NONHPOA (GAUZE/BANDAGES/DRESSINGS) ×2
BENZOIN TINCTURE PRP APPL 2/3 (GAUZE/BANDAGES/DRESSINGS) ×3 IMPLANT
BLADE HEX COATED 2.75 (ELECTRODE) ×3 IMPLANT
BLADE SURG 15 STRL LF DISP TIS (BLADE) ×2 IMPLANT
BLADE SURG 15 STRL SS (BLADE) ×6
BLADE SURG ROTATE 9660 (MISCELLANEOUS) IMPLANT
BRIEF STRETCH FOR OB PAD LRG (UNDERPADS AND DIAPERS) ×1 IMPLANT
CANISTER SUCTION 1200CC (MISCELLANEOUS) ×3 IMPLANT
CHLORAPREP W/TINT 26ML (MISCELLANEOUS) ×3 IMPLANT
CLOTH BEACON ORANGE TIMEOUT ST (SAFETY) ×3 IMPLANT
COVER MAYO STAND STRL (DRAPES) ×3 IMPLANT
COVER TABLE BACK 60X90 (DRAPES) ×3 IMPLANT
DECANTER SPIKE VIAL GLASS SM (MISCELLANEOUS) ×3 IMPLANT
DERMABOND ADVANCED (GAUZE/BANDAGES/DRESSINGS) ×1
DERMABOND ADVANCED .7 DNX12 (GAUZE/BANDAGES/DRESSINGS) IMPLANT
DRAPE PED LAPAROTOMY (DRAPES) ×3 IMPLANT
DRAPE UTILITY XL STRL (DRAPES) ×4 IMPLANT
DRSG PAD ABDOMINAL 8X10 ST (GAUZE/BANDAGES/DRESSINGS) ×3 IMPLANT
DRSG TEGADERM 2-3/8X2-3/4 SM (GAUZE/BANDAGES/DRESSINGS) ×2 IMPLANT
ELECT COATED BLADE 2.86 ST (ELECTRODE) ×3 IMPLANT
ELECT REM PT RETURN 9FT ADLT (ELECTROSURGICAL) ×3
ELECTRODE REM PT RTRN 9FT ADLT (ELECTROSURGICAL) ×2 IMPLANT
GAUZE SPONGE 4X4 12PLY STRL LF (GAUZE/BANDAGES/DRESSINGS) IMPLANT
GAUZE VASELINE 1X8 (GAUZE/BANDAGES/DRESSINGS) IMPLANT
GLOVE BIO SURGEON STRL SZ7 (GLOVE) ×4 IMPLANT
GLOVE BIOGEL PI IND STRL 7.0 (GLOVE) IMPLANT
GLOVE BIOGEL PI IND STRL 7.5 (GLOVE) ×2 IMPLANT
GLOVE BIOGEL PI INDICATOR 7.0 (GLOVE) ×3
GLOVE BIOGEL PI INDICATOR 7.5 (GLOVE) ×1
GLOVE ECLIPSE 6.5 STRL STRAW (GLOVE) ×1 IMPLANT
GOWN PREVENTION PLUS XLARGE (GOWN DISPOSABLE) ×4 IMPLANT
NDL HYPO 25X1 1.5 SAFETY (NEEDLE) ×2 IMPLANT
NEEDLE HYPO 25X1 1.5 SAFETY (NEEDLE) ×3 IMPLANT
NS IRRIG 1000ML POUR BTL (IV SOLUTION) ×3 IMPLANT
PACK BASIN DAY SURGERY FS (CUSTOM PROCEDURE TRAY) ×3 IMPLANT
PACK LITHOTOMY IV (CUSTOM PROCEDURE TRAY) ×3 IMPLANT
PENCIL BUTTON HOLSTER BLD 10FT (ELECTRODE) ×3 IMPLANT
SHEARS HARMONIC 9CM CVD (BLADE) IMPLANT
SLEEVE SCD COMPRESS KNEE MED (MISCELLANEOUS) ×1 IMPLANT
SPONGE GAUZE 2X2 8PLY STRL LF (GAUZE/BANDAGES/DRESSINGS) ×1 IMPLANT
SPONGE SURGIFOAM ABS GEL 100 (HEMOSTASIS) IMPLANT
STRIP CLOSURE SKIN 1/2X4 (GAUZE/BANDAGES/DRESSINGS) ×3 IMPLANT
SURGILUBE 2OZ TUBE FLIPTOP (MISCELLANEOUS) ×3 IMPLANT
SUT ETHILON 4 0 P 3 18 (SUTURE) ×1 IMPLANT
SUT MON AB 4-0 PC3 18 (SUTURE) IMPLANT
SUT PROLENE 6 0 P 1 18 (SUTURE) IMPLANT
SUT SILK 2 0 FS (SUTURE) IMPLANT
SUT VIC AB 3-0 SH 27 (SUTURE) ×6
SUT VIC AB 3-0 SH 27X BRD (SUTURE) ×2 IMPLANT
SUT VICRYL 3-0 CR8 SH (SUTURE) IMPLANT
SYR BULB 3OZ (MISCELLANEOUS) ×3 IMPLANT
SYR CONTROL 10ML LL (SYRINGE) ×3 IMPLANT
TAPE CLOTH SURG 6X10 WHT LF (GAUZE/BANDAGES/DRESSINGS) ×3 IMPLANT
TOWEL OR 17X24 6PK STRL BLUE (TOWEL DISPOSABLE) ×5 IMPLANT
TOWEL OR NON WOVEN STRL DISP B (DISPOSABLE) ×3 IMPLANT
TRAY DSU PREP LF (CUSTOM PROCEDURE TRAY) ×3 IMPLANT
TRAY PROCTOSCOPIC FIBER OPTIC (SET/KITS/TRAYS/PACK) IMPLANT
TUBE CONNECTING 20X1/4 (TUBING) ×3 IMPLANT
UNDERPAD 30X30 INCONTINENT (UNDERPADS AND DIAPERS) ×3 IMPLANT
YANKAUER SUCT BULB TIP NO VENT (SUCTIONS) ×3 IMPLANT

## 2013-03-01 NOTE — Anesthesia Preprocedure Evaluation (Signed)
Anesthesia Evaluation  Patient identified by MRN, date of birth, ID band Patient awake    Reviewed: Allergy & Precautions, H&P , NPO status , Patient's Chart, lab work & pertinent test results  History of Anesthesia Complications Negative for: history of anesthetic complications  Airway Mallampati: II TM Distance: >3 FB Neck ROM: Full    Dental  (+) Teeth Intact, Dental Advisory Given and Caps   Pulmonary asthma (last inhaler needed 3 weeks ago) ,  breath sounds clear to auscultation  Pulmonary exam normal       Cardiovascular - Peripheral Vascular Disease Rhythm:Regular Rate:Normal  '13 ECHO: normal LVF, normal valves, no evidence of MVP   Neuro/Psych negative neurological ROS     GI/Hepatic negative GI ROS, Neg liver ROS,   Endo/Other  negative endocrine ROS  Renal/GU negative Renal ROS     Musculoskeletal   Abdominal   Peds  Hematology negative hematology ROS (+)   Anesthesia Other Findings   Reproductive/Obstetrics                           Anesthesia Physical Anesthesia Plan  ASA: II  Anesthesia Plan: General   Post-op Pain Management:    Induction: Intravenous  Airway Management Planned: Oral ETT  Additional Equipment:   Intra-op Plan:   Post-operative Plan: Extubation in OR  Informed Consent: I have reviewed the patients History and Physical, chart, labs and discussed the procedure including the risks, benefits and alternatives for the proposed anesthesia with the patient or authorized representative who has indicated his/her understanding and acceptance.   Dental advisory given  Plan Discussed with: CRNA and Surgeon  Anesthesia Plan Comments: (Plan routine monitors, GETA)        Anesthesia Quick Evaluation

## 2013-03-01 NOTE — Anesthesia Procedure Notes (Signed)
Procedure Name: LMA Insertion Date/Time: 03/01/2013 12:41 PM Performed by: Zenia Resides D Pre-anesthesia Checklist: Patient identified, Emergency Drugs available, Suction available and Patient being monitored Patient Re-evaluated:Patient Re-evaluated prior to inductionOxygen Delivery Method: Circle System Utilized Preoxygenation: Pre-oxygenation with 100% oxygen Intubation Type: IV induction Ventilation: Mask ventilation without difficulty LMA: LMA inserted LMA Size: 4.0 Number of attempts: 1 Airway Equipment and Method: bite block Placement Confirmation: positive ETCO2 Tube secured with: Tape Dental Injury: Teeth and Oropharynx as per pre-operative assessment

## 2013-03-01 NOTE — Anesthesia Postprocedure Evaluation (Signed)
Anesthesia Post Note  Patient: Nicole Fleming  Procedure(s) Performed: Procedure(s) (LRB): HEMORRHOIDECTOMY (N/A) SKIN BIOPSY  X 2  ABDOMINAL WALL (Left)  Anesthesia type: general  Patient location: PACU  Post pain: Pain level controlled  Post assessment: Patient's Cardiovascular Status Stable  Last Vitals:  Filed Vitals:   03/01/13 1500  BP: 123/74  Pulse: 64  Temp:   Resp: 10    Post vital signs: Reviewed and stable  Level of consciousness: sedated  Complications: No apparent anesthesia complications

## 2013-03-01 NOTE — Op Note (Signed)
Preop diagnosis: #1 pigmented skin lesions left abdominal wall #2 enlarged external hemorrhoids  Postop diagnosis: Same Procedure performed #12 column hemorrhoidectomy #2 skin biopsies x2 of the left abdominal wall Surgeon: Dr. Manus Rudd Anesthesia: General via LMA Indications: This is a 52 year old femaleWith a history of irritable bowel syndrome alternating between constipation and diarrhea. She presents with worsening external hemorrhoids. She also has 2 pigmented skin lesions on her left abdominal wall. Her primary care physician is concerned about these and requested biopsy at the time of hemorrhoidectomy.  Description of procedure: The patient brought to the operating room placed in supine position on the operating room table. After an adequate level of general anesthesia was obtained, the patient's legs were placed in lithotomy position in yellowfin stirrups. We prepped her abdomen with ChloraPrep and draped in sterile fashion. A timeout was taken to ensure the proper patient and proper procedure.  We anesthetized the 2 very small skin lesions with a total of 2 cc of Exparel.  I made an elliptical incision around each one and completely excised each skin lesion. These were sent together for pathologic examination. Both wounds were closed with interrupted 4-0 nylon sutures. Dry dressings were applied.  The patient's perineum was then prepped with Betadine and draped in sterile fashion. A timeout was taken to ensure the proper patient proper procedure.We infiltrated the intersphincteric groove with 18 cc of exparel.  The patient has a very large right posterior external hemorrhoidal complex. As a smaller protruding left lateral hemorrhoid complex. We inserted the silver bullet retractor and grasped the large right posterior hemorrhoid complex with a Pennington clamp. This hemorrhoid seems to track up into the rectum and is a external and internal hemorrhoid complex. We then used the harmonic  scalpel to completely excise this hemorrhoid complex down to the sphincter muscle. Hemostasis was good. We reapproximated the edges of the mucosa with 3-0 Vicryl and a running locking fashion out to the anoderm. We closed the anoderm in a running fashion leaving about 5 mm open externally to allow drainage. We then turned our attention to the left lateral hemorrhoid complex. This was much smaller. We grasped this with a Pennington clamp and excised it with the harmonic scalpel. We reapproximated the edges with 3-0 Vicryl. We inspected carefully for hemostasis. Gelfoam was packed into the rectum. A dry dressing was applied. The patient was then extubated and brought to the recovery room in stable condition. All sponge, instrument, and needle counts are correct.  Wilmon Arms. Corliss Skains, MD, [facility]  General/ Trauma Surgery  03/01/2013 1:39 PM

## 2013-03-01 NOTE — Transfer of Care (Signed)
Immediate Anesthesia Transfer of Care Note  Patient: Nicole Fleming  Procedure(s) Performed: Procedure(s): HEMORRHOIDECTOMY (N/A) SKIN BIOPSY  X 2  ABDOMINAL WALL (Left)  Patient Location: PACU  Anesthesia Type:General  Level of Consciousness: awake, alert  and oriented  Airway & Oxygen Therapy: Patient Spontanous Breathing and Patient connected to face mask oxygen  Post-op Assessment: Report given to PACU RN and Post -op Vital signs reviewed and stable  Post vital signs: Reviewed and stable  Complications: No apparent anesthesia complications

## 2013-03-01 NOTE — H&P (Signed)
HPI  Nicole Fleming is a 52 y.o. female. PCP - Dr. Leda Quail  HPI  52 year old female presents with worsening external hemorrhoids. The patient has some irritable bowel syndrome and alternates between constipation and diarrhea. She was seen last year for similar problem but seems to have gotten worse. Indications to see some blood on the toilet paper. She has some discomfort around her anus. She presents now for surgical evaluation. The patient also has 2 pigmented skin lesions on her abdominal wall which Dr. Hyacinth Meeker has expressed some concern about. She is requesting a biopsy of these.  Past Medical History   Diagnosis  Date   .  Asthma    .  Hemorrhoids, external    .  Abnormal pap  2000     h/o LEEP   .  Schatzki's ring  1/07     of esophagus   .  Depression      and anxiety   .  IBS (irritable bowel syndrome)      questionable gluten allergy   .  Mitral valve prolapse  2006    Past Surgical History   Procedure  Laterality  Date   .  Cholecystectomy   12/2005   .  Cesarean section   1990   .  Cesarean section   1992   .  Lipoma removal   10/29/2011     abdominal wall   .  Tubal ligation     .  Esophageal dilation   1/07   .  Cyst excision   2013     benign cyst removed from abdomen    Family History   Problem  Relation  Age of Onset   .  Heart disease  Father  80     cardiac arrest/mitral valve replaced   Social History  History   Substance Use Topics   .  Smoking status:  Never Smoker   .  Smokeless tobacco:  Never Used   .  Alcohol Use:  No    Allergies   Allergen  Reactions   .  Amoxicillin  Rash    Current Outpatient Prescriptions   Medication  Sig  Dispense  Refill   .  albuterol (PROVENTIL HFA;VENTOLIN HFA) 108 (90 BASE) MCG/ACT inhaler  Inhale 2 puffs into the lungs every 6 (six) hours as needed for wheezing.  1 Inhaler  2   .  ibuprofen (MOTRIN IB) 200 MG tablet  Take 3 tablets (600 mg total) by mouth every 8 (eight) hours as needed for pain (Stop after  2-3 days).  30 tablet  0   .  Levonorgestrel (MIRENA IU)  by Intrauterine route.     .  loratadine (CLARITIN) 10 MG tablet  Take 10 mg by mouth daily. As needed      No current facility-administered medications for this visit.   Review of Systems  Review of Systems  Constitutional: Negative for fever, chills and unexpected weight change.  HENT: Negative for hearing loss, congestion, sore throat, trouble swallowing and voice change.  Eyes: Negative for visual disturbance.  Respiratory: Negative for cough and wheezing.  Cardiovascular: Negative for chest pain, palpitations and leg swelling.  Gastrointestinal: Positive for diarrhea, constipation, anal bleeding and rectal pain. Negative for nausea, vomiting, abdominal pain, blood in stool and abdominal distention.  Genitourinary: Negative for hematuria, vaginal bleeding and difficulty urinating.  Musculoskeletal: Negative for arthralgias.  Skin: Negative for rash and wound.  Neurological: Negative for seizures, syncope and headaches.  Hematological: Negative for adenopathy. Does not bruise/bleed easily.  Psychiatric/Behavioral: Negative for confusion.  Blood pressure 128/78, pulse 72, temperature 97.7 F (36.5 C), temperature source Temporal, resp. rate 16, height 5\' 8"  (1.727 m), weight 175 lb 9.6 oz (79.652 kg), last menstrual period 03/05/2012.  Physical Exam  Physical Exam  WDWN in NAD  HEENT: EOMI, sclera anicteric  Neck: No masses, no thyromegaly  Lungs: CTA bilaterally; normal respiratory effort  CV: Regular rate and rhythm; no murmurs  Abd: Left side - two 3 mm pigmented skin lesions; have become darker, but not enlarged  Rectal: large loose external hemorrhoids - right anterior/ left lateral; good sphincter tone; no fissure  Ext: Well-perfused; no edema  Skin: Warm, dry; no sign of jaundice  Data Reviewed  none  Assessment  Enlarging external hemorrhoids  Two pigmented skin lesions - left abdominal wall  Plan  Skin biopsy  of abdominal wall skin lesions  Hemorrhoidectomy. The surgical procedure has been discussed with the patient. Potential risks, benefits, alternative treatments, and expected outcomes have been explained. All of the patient's questions at this time have been answered. The likelihood of reaching the patient's treatment goal is good. The patient understand the proposed surgical procedure and wishes to proceed.   Wilmon Arms. Corliss Skains, MD, [facility]  General/ Trauma Surgery  03/01/2013 10:36 AM

## 2013-03-02 ENCOUNTER — Encounter (HOSPITAL_BASED_OUTPATIENT_CLINIC_OR_DEPARTMENT_OTHER): Payer: Self-pay | Admitting: Surgery

## 2013-03-03 ENCOUNTER — Telehealth (INDEPENDENT_AMBULATORY_CARE_PROVIDER_SITE_OTHER): Payer: Self-pay | Admitting: General Surgery

## 2013-03-03 NOTE — Telephone Encounter (Signed)
Thank you Arline Asp for taking the call and giving path to the patient

## 2013-03-03 NOTE — Telephone Encounter (Signed)
Pt returned call to [facility]. Pt advised per epic note of path result. Pt to call with questions or concerns and keep po appt.

## 2013-03-03 NOTE — Telephone Encounter (Signed)
LMOM for patient to call back and ask for [facility]. Need to let her know that her path came back that the skin lesions were not skin cancer

## 2013-03-09 ENCOUNTER — Ambulatory Visit (INDEPENDENT_AMBULATORY_CARE_PROVIDER_SITE_OTHER): Payer: BC Managed Care – PPO | Admitting: General Surgery

## 2013-03-09 DIAGNOSIS — Z4802 Encounter for removal of sutures: Secondary | ICD-10-CM

## 2013-03-09 NOTE — Patient Instructions (Signed)
Patient came in today for two suture removal on her mid abdominal wall. I removed the sutures and placed one steri strep over both surgery site and told the patient that the strep can stay for a week. She asked if it was normal to have drainage from her hemorrhoid surgery and I told her yes, I asked how much was coming out and she stated that she wears a small panty liner and she changes it out twice a day, it does not goes through the panty liner. I told her if it did that she needed to call us and we need to page Dr Corliss Skains or talk to one of the doctors here. I asked if she had been running any fevers and N/V and she stated no now, but she stated a day or so after surgery she had diarrhea and she called up to the office and talked to one of the nurses on the office and told her that it could be the pain meds that was giving her the diarrhea and there was a stomach bug going around. I asked if she was still having the diarrhea like she was. And she stated no. She still feels  weak I told her to eat a lot of protein and drink water and walk around outside or in the house. She wanted to know if she could drive. I told her as long she was not taking any pain meds that she could drive, but the way she is feeling a little weak to have someone to go with her and if they need to drive back they can and she agree to that. And she will see Dr Corliss Skains on 03-15-13.

## 2013-03-15 ENCOUNTER — Encounter (INDEPENDENT_AMBULATORY_CARE_PROVIDER_SITE_OTHER): Payer: Self-pay | Admitting: Surgery

## 2013-03-15 ENCOUNTER — Ambulatory Visit (INDEPENDENT_AMBULATORY_CARE_PROVIDER_SITE_OTHER): Payer: BC Managed Care – PPO | Admitting: Surgery

## 2013-03-15 VITALS — BP 117/69 | HR 62 | Temp 98.2°F | Resp 12 | Ht 68.0 in | Wt 172.0 lb

## 2013-03-15 DIAGNOSIS — L989 Disorder of the skin and subcutaneous tissue, unspecified: Secondary | ICD-10-CM

## 2013-03-15 DIAGNOSIS — K644 Residual hemorrhoidal skin tags: Secondary | ICD-10-CM

## 2013-03-15 NOTE — Progress Notes (Signed)
Status post 2, hemorrhoidectomy and skin biopsy on 03/01/13. Skin biopsies were negative. The patient is doing reasonably well. She still has some discomfort and is doing frequent sitz baths. Her bowel movements are normal. She feels like she has some swelling anteriorly. She does not see much drainage from her anus.  On examination the hemorrhoidectomy incisions are healing well. There is some gapping at the posterior suture line but this is minimal. She does have one remaining anterior hemorrhoid that is small and soft. No sign of thrombosis. No sign of abscess.  I encouraged her to continue with the sitz baths and stool softeners. The swelling should decrease over time. We will recheck her in 2-3 weeks. No further treatment needed for the skin lesions.  Wilmon Arms. Corliss Skains, MD, [facility]  General/ Trauma Surgery  03/15/2013 10:07 AM

## 2013-03-21 ENCOUNTER — Ambulatory Visit (INDEPENDENT_AMBULATORY_CARE_PROVIDER_SITE_OTHER): Payer: BC Managed Care – PPO | Admitting: General Surgery

## 2013-03-21 DIAGNOSIS — Z4802 Encounter for removal of sutures: Secondary | ICD-10-CM

## 2013-03-21 NOTE — Patient Instructions (Signed)
Patient called this morning and stated that she had a suture coming out and she did not want to pull on it or have her son to cut the suture and I told her not to pull on it, to come in the office and I will do a nurse only and cut the suture. She stated that it came out yesterday when she had a BM. And I told her that she may have more to come out that she can call me and I will cut them for her.

## 2013-03-31 ENCOUNTER — Ambulatory Visit (INDEPENDENT_AMBULATORY_CARE_PROVIDER_SITE_OTHER): Payer: BC Managed Care – PPO | Admitting: Surgery

## 2013-03-31 ENCOUNTER — Encounter (INDEPENDENT_AMBULATORY_CARE_PROVIDER_SITE_OTHER): Payer: Self-pay | Admitting: Surgery

## 2013-03-31 VITALS — BP 112/62 | HR 76 | Resp 14 | Ht 68.0 in | Wt 171.4 lb

## 2013-03-31 DIAGNOSIS — K644 Residual hemorrhoidal skin tags: Secondary | ICD-10-CM

## 2013-03-31 NOTE — Progress Notes (Signed)
Status post hemorrhoidectomy on 03/01/13. She is doing quite well. The area of the hemorrhoidectomy is completely healed. She still has a little bone loose hemorrhoidal tissue anteriorly but there is no inflammation or tenderness here. Continue avoiding constipation. Followup as needed.  Wilmon Arms. Corliss Skains, MD, [facility]  General/ Trauma Surgery  03/31/2013 2:44 PM

## 2013-04-14 IMAGING — CR DG CHEST 2V
2 series · 2 of 2 positions shown · non-contrast
Comparison: 02/07/2009

CLINICAL DATA: Pain.  Mitral valve prolapse.  Asthma.

CHEST - 2 VIEW

[w chest pa]
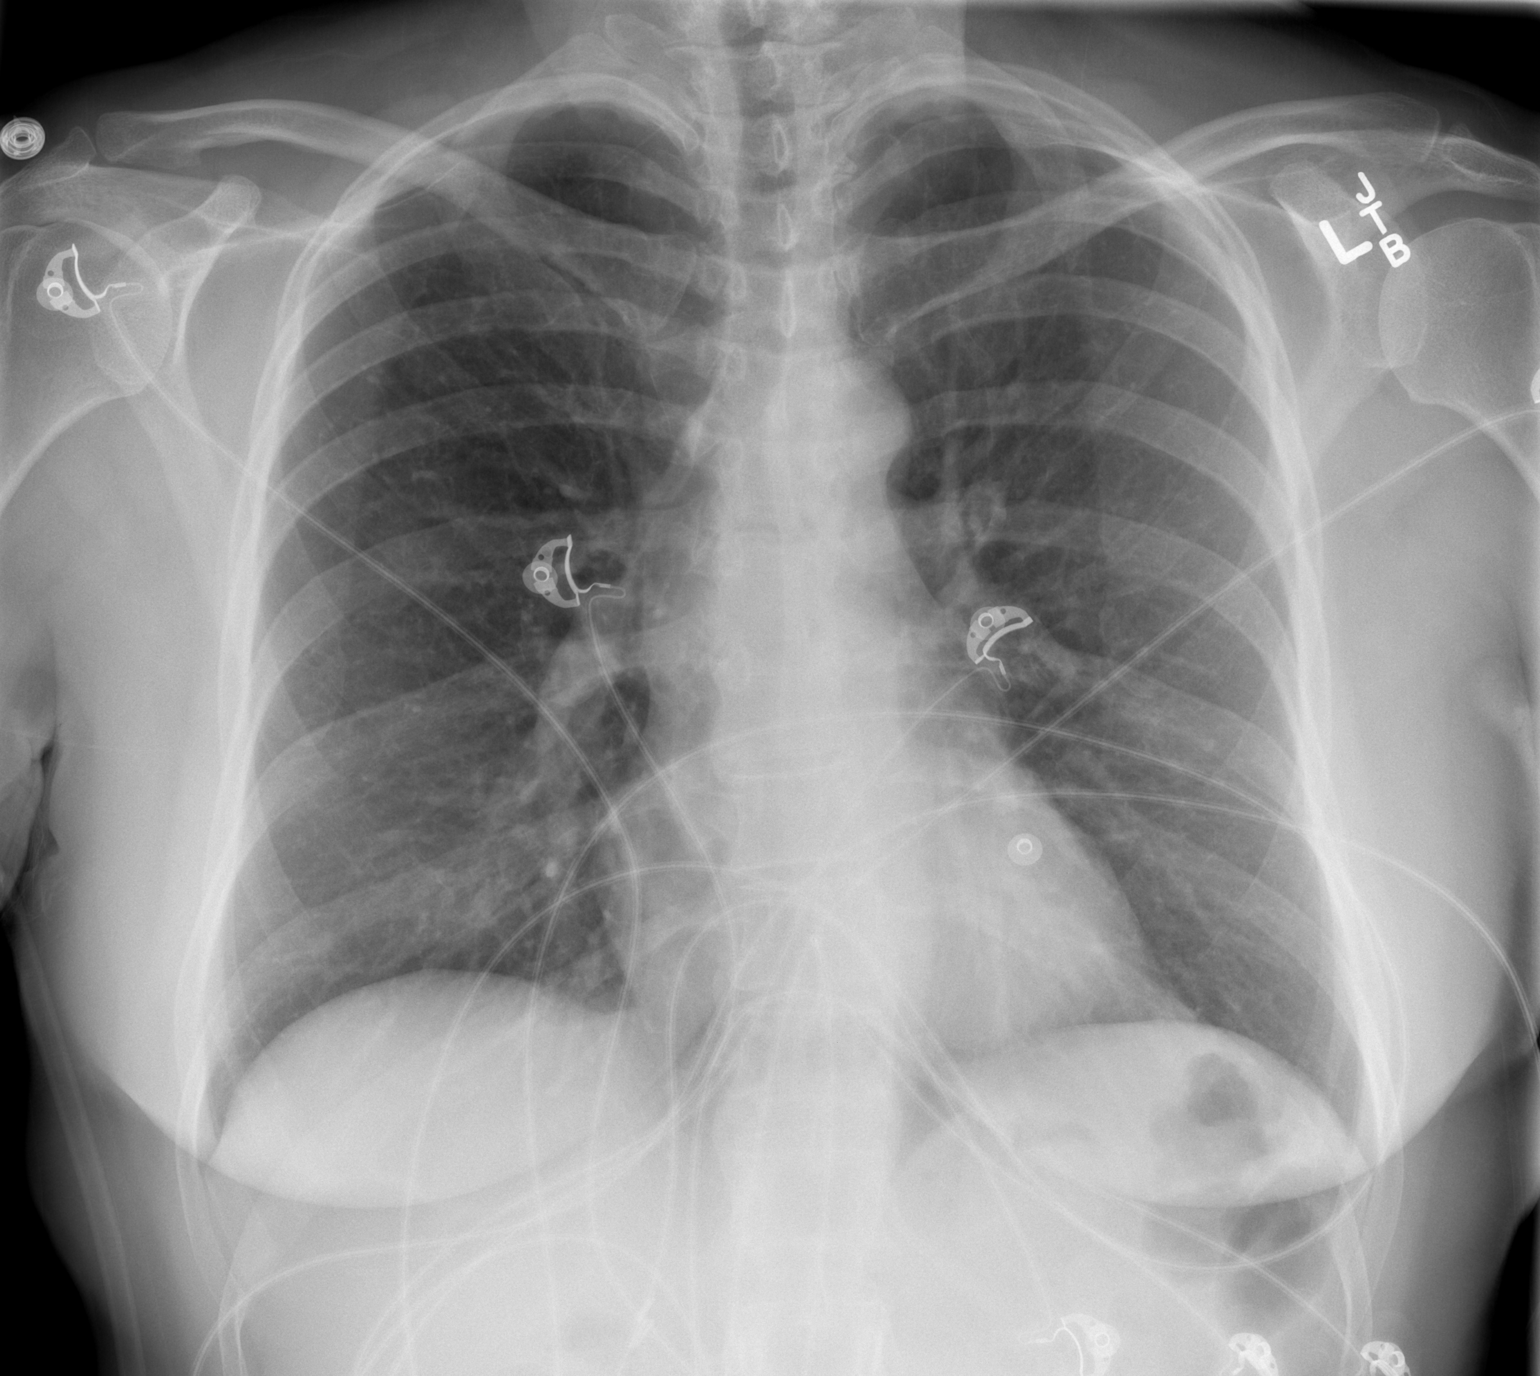

[w chest lat]
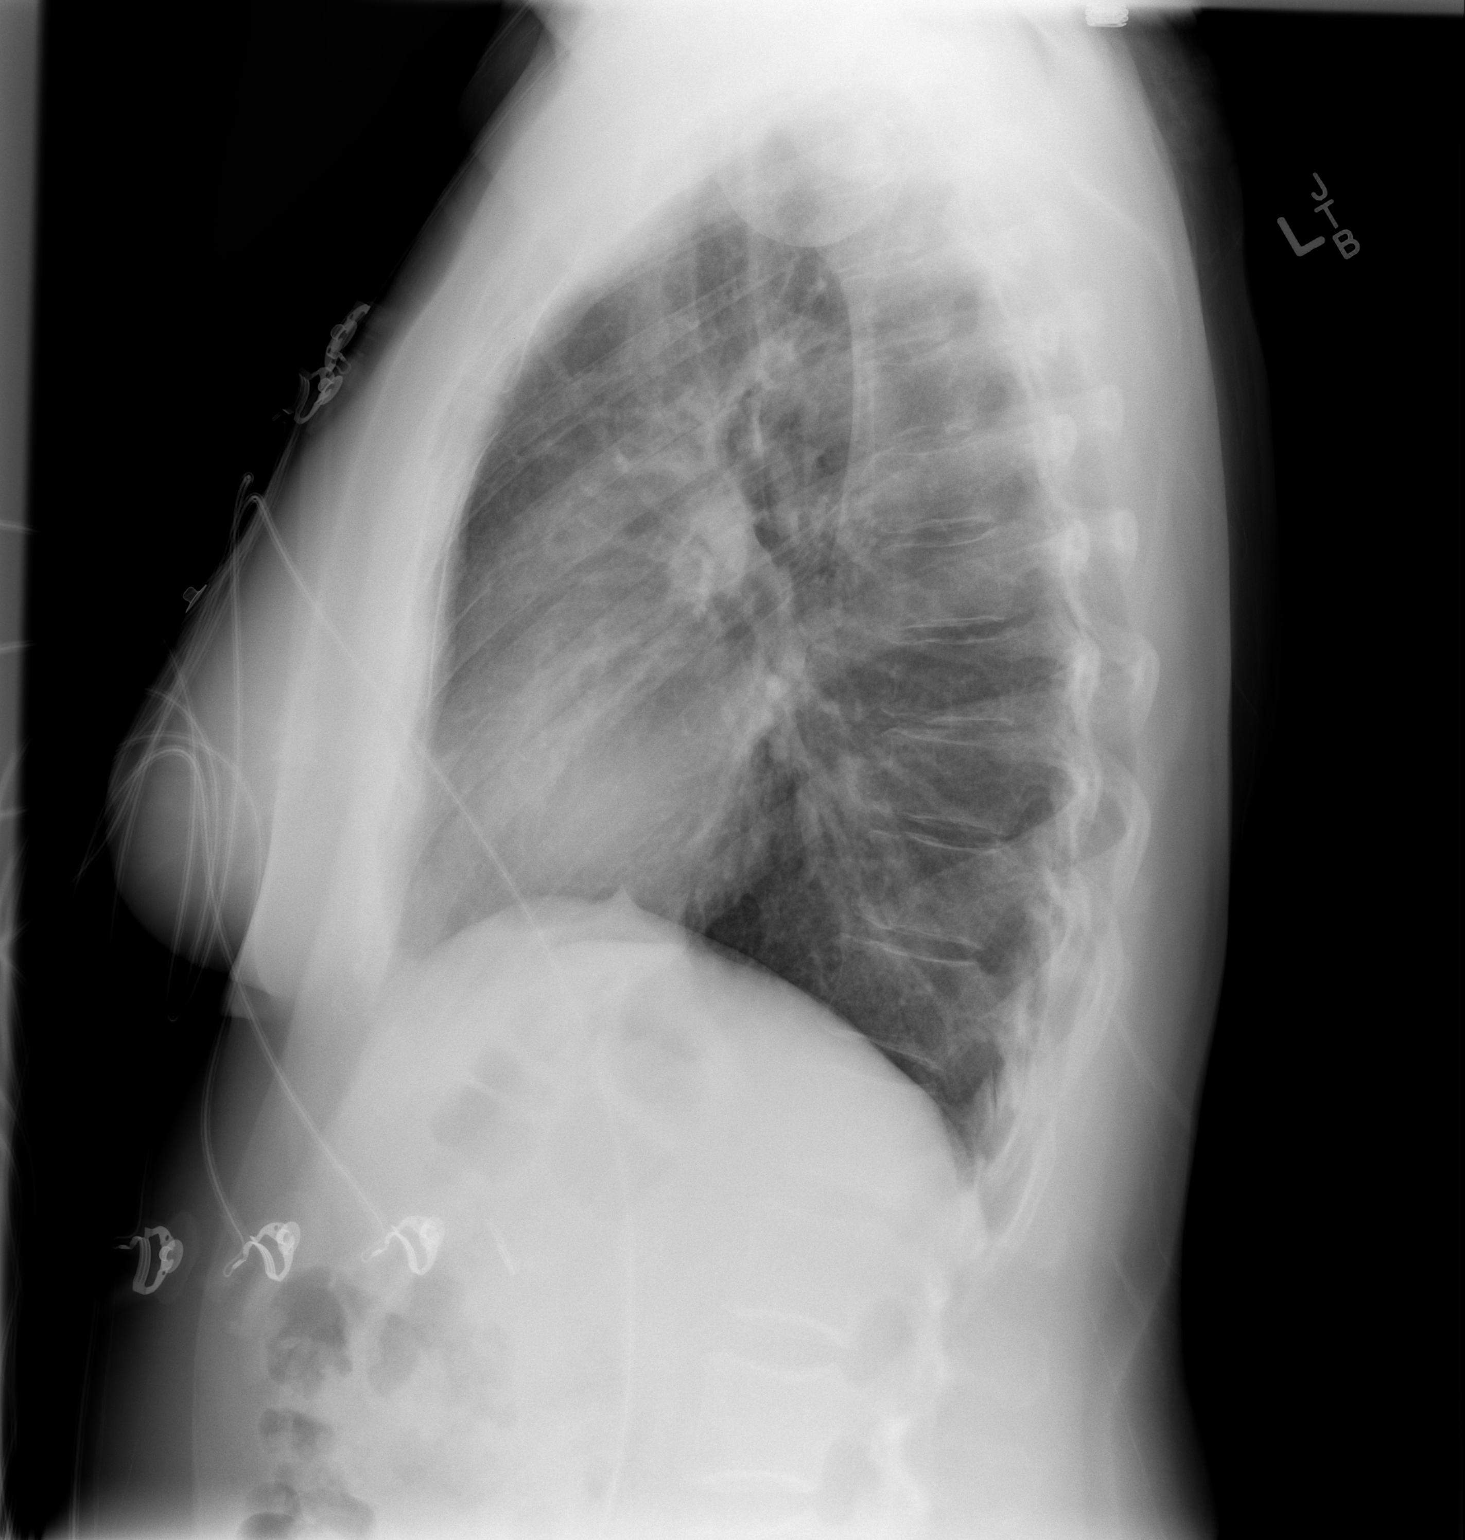

[2 of 2 positions shown; findings below may reference images not displayed]

FINDINGS: Minimal chronic left apical scarring noted.  The lungs
appear otherwise clear. Cardiac and mediastinal contours appear
unremarkable.

No pleural effusion noted.
IMPRESSION: 1.  No significant thoracic abnormality observed.

## 2013-07-28 ENCOUNTER — Telehealth: Payer: Self-pay | Admitting: Obstetrics & Gynecology

## 2013-07-28 MED ORDER — CITALOPRAM HYDROBROMIDE 20 MG PO TABS: 20.0000 mg | ORAL_TABLET | Freq: Every day | ORAL | Status: DC

## 2013-07-28 NOTE — Telephone Encounter (Signed)
Called patient. She is tearful and states that she recently lost her father and her boyfriend has told her he wants to be on a break. She feels overwhelmed for past 3-4 weeks. States she is not thinking of harming herself or others. She has an old rx of Citalopram from 09/2012 that patient states she took for 1 month but did not continue. Did not remember if she had any side effects from this rx.  She is asking for refill so that she can restart taking rx.   Advised patient I would call her back in the morning to discuss message from Dr. Hyacinth Meeker. Patient is agreeable.

## 2013-07-28 NOTE — Telephone Encounter (Signed)
Rx done.  Called pt and let her know this.  Called cell number and she didn't answer.  Left detailed message.   Encounter closed.

## 2013-07-28 NOTE — Telephone Encounter (Signed)
Pt wants to talk with the nurse.  No information given °

## 2013-07-29 NOTE — Telephone Encounter (Signed)
Spoke with patient. She got Dr. Rondel Baton message and was very thankful. Patient states that she has been taking medication and feeling "a little nauseated". No vomiting. Advised that the nausea is a normal side effect of starting antidepressants, it should subside and to continue with medication as long as she is tolerating it and to call if worsens or cannot tolerate medication. Patient agreaeble. Will call back prn.

## 2013-07-29 NOTE — Telephone Encounter (Signed)
Nausea is common the first week of use.  Then it goes away.  Thanks.

## 2013-11-28 ENCOUNTER — Other Ambulatory Visit: Payer: Self-pay

## 2013-11-28 DIAGNOSIS — Z1231 Encounter for screening mammogram for malignant neoplasm of breast: Secondary | ICD-10-CM

## 2014-01-12 ENCOUNTER — Encounter: Payer: Self-pay | Admitting: Obstetrics & Gynecology

## 2014-01-12 ENCOUNTER — Ambulatory Visit
Admission: RE | Admit: 2014-01-12 | Discharge: 2014-01-12 | Disposition: A | Discharge status: Home / Self Care | Payer: BC Managed Care – PPO | Source: Ambulatory Visit

## 2014-01-12 ENCOUNTER — Ambulatory Visit (INDEPENDENT_AMBULATORY_CARE_PROVIDER_SITE_OTHER): Payer: BC Managed Care – PPO | Admitting: Obstetrics & Gynecology

## 2014-01-12 VITALS — BP 118/68 | HR 60 | Resp 16 | Ht 68.0 in | Wt 169.8 lb

## 2014-01-12 DIAGNOSIS — Z1231 Encounter for screening mammogram for malignant neoplasm of breast: Secondary | ICD-10-CM

## 2014-01-12 DIAGNOSIS — Z780 Asymptomatic menopausal state: Secondary | ICD-10-CM

## 2014-01-12 DIAGNOSIS — N951 Menopausal and female climacteric states: Secondary | ICD-10-CM

## 2014-01-12 DIAGNOSIS — Z0000 Encounter for general adult medical examination without abnormal findings: Secondary | ICD-10-CM

## 2014-01-12 DIAGNOSIS — Z124 Encounter for screening for malignant neoplasm of cervix: Secondary | ICD-10-CM

## 2014-01-12 DIAGNOSIS — Z01419 Encounter for gynecological examination (general) (routine) without abnormal findings: Secondary | ICD-10-CM

## 2014-01-12 LAB — LIPID PANEL
CHOL/HDL RATIO: 4.9 ratio
CHOLESTEROL: 178 mg/dL (ref 0–200)
HDL: 36 mg/dL — ABNORMAL LOW (ref >39)
LDL Cholesterol: 112 mg/dL — ABNORMAL HIGH (ref 0–99)
Triglycerides: 150 mg/dL — ABNORMAL HIGH (ref <150)
VLDL: 30 mg/dL (ref 0–40)

## 2014-01-12 LAB — POCT URINALYSIS DIPSTICK
Bilirubin, UA: NEGATIVE
Blood, UA: NEGATIVE
GLUCOSE UA: NEGATIVE
Ketones, UA: NEGATIVE
NITRITE UA: NEGATIVE
Protein, UA: NEGATIVE
UROBILINOGEN UA: NEGATIVE
pH, UA: 7

## 2014-01-12 LAB — COMPREHENSIVE METABOLIC PANEL
ALK PHOS: 96 U/L (ref 39–117)
ALT: 14 U/L (ref 0–35)
AST: 14 U/L (ref 0–37)
Albumin: 4.7 g/dL (ref 3.5–5.2)
BILIRUBIN TOTAL: 0.6 mg/dL (ref 0.2–1.2)
BUN: 11 mg/dL (ref 6–23)
CO2: 25 mEq/L (ref 19–32)
CREATININE: 0.69 mg/dL (ref 0.50–1.10)
Calcium: 9.7 mg/dL (ref 8.4–10.5)
Chloride: 104 mEq/L (ref 96–112)
Glucose, Bld: 98 mg/dL (ref 70–99)
Potassium: 4.2 mEq/L (ref 3.5–5.3)
Sodium: 138 mEq/L (ref 135–145)
Total Protein: 7.4 g/dL (ref 6.0–8.3)

## 2014-01-12 LAB — HEMOGLOBIN, FINGERSTICK: HEMOGLOBIN, FINGERSTICK: 13.7 g/dL (ref 12.0–16.0)

## 2014-01-12 NOTE — Patient Instructions (Signed)

## 2014-01-12 NOTE — Progress Notes (Signed)
53 y.o. G2P2 Divorced CaucasianF here for annual exam.  Planning on going to [place] with significant other in August.  Dating since September.    Had hemorrhoidectomy in July.  Recovery was hard but she is really doing well now.  Notices that she has to be very careful about having loose stools.  Has some trouble with being able to "hold" until she gets to the bathroom.  D/W pt PT for improvement in tone.    Patient's last menstrual period was 11/26/2011.          Sexually active: yes  The current method of family planning is tubal ligation and IUD.    Exercising: yes  walking Smoker:  no  Health Maintenance: Pap:  12/30/12-WNL History of abnormal Pap: Yes-LEEP 1999/2000 MMG:  12/30/12-normal Colonoscopy:  11/09-repeat in 10 years BMD:   2009 TDaP:  Unsure.  Declines today Screening Labs: lipids and cmp today, Hb today: 13.7, Urine today: WBC-1+, PH-7.0   reports that she has never smoked. She has never used smokeless tobacco. She reports that she does not drink alcohol or use illicit drugs.  Past Medical History  Diagnosis Date  . Asthma   . Hemorrhoids, external   . Abnormal pap 2000    h/o LEEP  . Schatzki's ring 1/07    of esophagus  . Depression     and anxiety  . IBS (irritable bowel syndrome)     questionable gluten allergy  . Mitral valve prolapse 2006    echo 2013-no MVP-normal valve-no regurg  . Contact lens/glasses fitting     wears contacts or glasses    Past Surgical History  Procedure Laterality Date  . Cholecystectomy  12/2005  . Cesarean section  1990  . Cesarean section  1992  . Lipoma removal  10/29/2011    abdominal wall  . Tubal ligation    . Esophageal dilation  1/07  . Cyst excision  2013    benign cyst removed from abdomen  . Hemorrhoid surgery N/A 03/01/2013    Procedure: HEMORRHOIDECTOMY;  Surgeon: Imogene Burn. Georgette Dover, MD;  Location: [place];  Service: General;  Laterality: N/A;  . Skin biopsy Left 03/01/2013    Procedure:  SKIN BIOPSY  X 2  ABDOMINAL WALL;  Surgeon: Imogene Burn. Georgette Dover, MD;  Location: [place];  Service: General;  Laterality: Left;  . Leep  1999/2000    Current Outpatient Prescriptions  Medication Sig Dispense Refill  . albuterol (PROVENTIL HFA;VENTOLIN HFA) 108 (90 BASE) MCG/ACT inhaler Inhale 2 puffs into the lungs every 6 (six) hours as needed for wheezing.  1 Inhaler  2  . citalopram (CELEXA) 20 MG tablet Take 1 tablet (20 mg total) by mouth daily.  30 tablet  12  . Levonorgestrel (MIRENA IU) by Intrauterine route.       No current facility-administered medications for this visit.    Family History  Problem Relation Age of Onset  . Heart disease Father 32    cardiac arrest/mitral valve replaced    ROS:  Pertinent items are noted in HPI.  Otherwise, a comprehensive ROS was negative.  Exam:   BP 118/68  Pulse 60  Resp 16  Ht 5\' 8"  (1.727 m)  Wt 169 lb 12.8 oz (77.021 kg)  BMI 25.82 kg/m2  LMP 11/26/2011   Height: 5\' 8"  (172.7 cm)  Ht Readings from Last 3 Encounters:  01/12/14 5\' 8"  (1.727 m)  03/31/13 5\' 8"  (1.727 m)  03/15/13 5\' 8"  (  1.727 m)    General appearance: alert, cooperative and appears stated age Head: Normocephalic, without obvious abnormality, atraumatic Neck: no adenopathy, supple, symmetrical, trachea midline and thyroid normal to inspection and palpation Lungs: clear to auscultation bilaterally Breasts: normal appearance, no masses or tenderness Heart: regular rate and rhythm Abdomen: soft, non-tender; bowel sounds normal; no masses,  no organomegaly Extremities: extremities normal, atraumatic, no cyanosis or edema Skin: Skin color, texture, turgor normal. No rashes or lesions Lymph nodes: Cervical, supraclavicular, and axillary nodes normal. No abnormal inguinal nodes palpated Neurologic: Grossly normal   Pelvic: External genitalia:  no lesions              Urethra:  normal appearing urethra with no masses, tenderness or lesions               Bartholins and Skenes: normal                 Vagina: normal appearing vagina with normal color and discharge, no lesions              Cervix: no lesions              Pap taken: yes Bimanual Exam:  Uterus:  normal size, contour, position, consistency, mobility, non-tender              Adnexa: normal adnexa and no mass, fullness, tenderness               Rectovaginal: Confirms               Anus:  normal sphincter tone, no lesions  A:  Well Woman with normal exam  S/p hemorrhoidectomy 7/14 H/O LEEP 1999  Dark moles  Mirena IUD 4/11  P: Mammogram scheduled today. pap smear. H/O LEEP.  Plan [place] one week before exam next year.  If elevated, will just remove IUD.  If low, will plan removal and replacement. FLP and CMP today return annually or prn

## 2014-01-16 LAB — IPS PAP SMEAR ONLY

## 2014-06-19 ENCOUNTER — Encounter: Payer: Self-pay | Admitting: Obstetrics & Gynecology

## 2014-07-24 ENCOUNTER — Other Ambulatory Visit (INDEPENDENT_AMBULATORY_CARE_PROVIDER_SITE_OTHER): Payer: Self-pay | Admitting: General Surgery

## 2014-08-21 ENCOUNTER — Other Ambulatory Visit: Payer: Self-pay | Admitting: Obstetrics & Gynecology

## 2014-08-21 NOTE — Telephone Encounter (Signed)
Medication refill request: celexa 20mg  Last AEX:  01-12-14 Next AEX: 01-26-15 Last MMG (if hormonal medication request): 01-12-14 neg Refill authorized: Given #30 with refills for 1 yr on 07-28-13. Pt had aex 01-12-14. rx filled out for #30 with 5 refills to get patient to next aex.

## 2014-08-21 NOTE — Telephone Encounter (Signed)
  Medication refill request: celexa 20mg  Last AEX: 01-12-14 Next AEX: 01-26-15 Last MMG (if hormonal medication request): 01-12-14 neg Refill authorized: Given #30 with refills for 1 yr on 07-28-13. Pt had aex 01-12-14.  You sent rx  for #30 with 5 refills to get patient to next aex, today. Another refill request just came in & pt now request 90 day supply.

## 2014-12-11 ENCOUNTER — Other Ambulatory Visit: Payer: Self-pay

## 2014-12-11 DIAGNOSIS — Z1231 Encounter for screening mammogram for malignant neoplasm of breast: Secondary | ICD-10-CM

## 2015-01-18 ENCOUNTER — Other Ambulatory Visit: Payer: BC Managed Care – PPO

## 2015-01-22 ENCOUNTER — Other Ambulatory Visit: Payer: Self-pay | Admitting: Obstetrics & Gynecology

## 2015-01-22 ENCOUNTER — Other Ambulatory Visit (INDEPENDENT_AMBULATORY_CARE_PROVIDER_SITE_OTHER): Payer: BC Managed Care – PPO

## 2015-01-22 DIAGNOSIS — Z780 Asymptomatic menopausal state: Secondary | ICD-10-CM

## 2015-01-23 LAB — FOLLICLE STIMULATING HORMONE: FSH: 122.2 m[IU]/mL — ABNORMAL HIGH

## 2015-01-25 ENCOUNTER — Telehealth: Payer: Self-pay

## 2015-01-25 DIAGNOSIS — Z30432 Encounter for removal of intrauterine contraceptive device: Secondary | ICD-10-CM

## 2015-01-25 NOTE — Telephone Encounter (Signed)
-----   Message from Megan Salon, MD sent at 01/24/2015  4:14 PM EDT ----- Inform pt that her [facility] is 122.  This is clearly menopausal range.  Will plan to remove her IUD at next appt on 01/25/14.

## 2015-01-25 NOTE — Telephone Encounter (Signed)
Spoke with patient. Advised of results and message as seen below from [place]. Patient is agreeable and verbalizes understanding. IUD removal sent for precert. Patient has appointment tomorrow 01/26/2015 with Dr.Miller for aex. Will have IUD removed at this time.  Cc: Felipa Emory  Routing to provider for final review. Patient agreeable to disposition. Will close encounter.

## 2015-01-25 NOTE — Telephone Encounter (Signed)
IUD removal has been pre-certed

## 2015-01-26 ENCOUNTER — Encounter: Payer: Self-pay | Admitting: Obstetrics & Gynecology

## 2015-01-26 ENCOUNTER — Ambulatory Visit (INDEPENDENT_AMBULATORY_CARE_PROVIDER_SITE_OTHER): Payer: BC Managed Care – PPO | Admitting: Obstetrics & Gynecology

## 2015-01-26 ENCOUNTER — Ambulatory Visit
Admission: RE | Admit: 2015-01-26 | Discharge: 2015-01-26 | Disposition: A | Discharge status: Home / Self Care | Payer: BC Managed Care – PPO | Source: Ambulatory Visit

## 2015-01-26 VITALS — BP 100/62 | HR 72 | Resp 14 | Ht 68.0 in | Wt 173.0 lb

## 2015-01-26 DIAGNOSIS — Z30432 Encounter for removal of intrauterine contraceptive device: Secondary | ICD-10-CM | POA: Diagnosis not present

## 2015-01-26 DIAGNOSIS — Z01419 Encounter for gynecological examination (general) (routine) without abnormal findings: Secondary | ICD-10-CM

## 2015-01-26 DIAGNOSIS — Z0000 Encounter for general adult medical examination without abnormal findings: Secondary | ICD-10-CM | POA: Diagnosis not present

## 2015-01-26 DIAGNOSIS — Z1231 Encounter for screening mammogram for malignant neoplasm of breast: Secondary | ICD-10-CM

## 2015-01-26 DIAGNOSIS — Z124 Encounter for screening for malignant neoplasm of cervix: Secondary | ICD-10-CM | POA: Diagnosis not present

## 2015-01-26 LAB — POCT URINALYSIS DIPSTICK
Bilirubin, UA: NEGATIVE
Blood, UA: NEGATIVE
GLUCOSE UA: NEGATIVE
KETONES UA: NEGATIVE
Nitrite, UA: NEGATIVE
PROTEIN UA: NEGATIVE
SPEC GRAV UA: 1.025
Urobilinogen, UA: NEGATIVE
pH, UA: 6.5

## 2015-01-26 LAB — LIPID PANEL
Cholesterol: 194 mg/dL (ref 0–200)
HDL: 34 mg/dL — AB (ref >=46)
LDL Cholesterol: 107 mg/dL — ABNORMAL HIGH (ref 0–99)
Total CHOL/HDL Ratio: 5.7 Ratio
Triglycerides: 263 mg/dL — ABNORMAL HIGH (ref <150)
VLDL: 53 mg/dL — ABNORMAL HIGH (ref 0–40)

## 2015-01-26 LAB — COMPREHENSIVE METABOLIC PANEL
ALK PHOS: 96 U/L (ref 39–117)
ALT: 11 U/L (ref 0–35)
AST: 14 U/L (ref 0–37)
Albumin: 4.5 g/dL (ref 3.5–5.2)
BUN: 18 mg/dL (ref 6–23)
CO2: 19 meq/L (ref 19–32)
Calcium: 9.4 mg/dL (ref 8.4–10.5)
Chloride: 106 mEq/L (ref 96–112)
Creat: 0.87 mg/dL (ref 0.50–1.10)
GLUCOSE: 114 mg/dL — AB (ref 70–99)
Potassium: 4.1 mEq/L (ref 3.5–5.3)
SODIUM: 141 meq/L (ref 135–145)
TOTAL PROTEIN: 7.1 g/dL (ref 6.0–8.3)
Total Bilirubin: 0.5 mg/dL (ref 0.2–1.2)

## 2015-01-26 LAB — TSH: TSH: 1.17 u[IU]/mL (ref 0.350–4.500)

## 2015-01-26 NOTE — Progress Notes (Signed)
Patient ID: Nicole Fleming, female   DOB: July 05, 1961, 54 y.o.   MRN: [id number]   54 y.o. G2P2 DivorcedCaucasianF here for annual exam and IUD removal.  Doing well.  D/W pt FSH value.  She is having some mild hot flashes but this is mild.  Pt not interested in HRT.    No LMP recorded. Patient is not currently having periods (Reason: IUD).          Sexually active: No.  The current method of family planning is tubal ligation.    Exercising: Yes.    walking Smoker:  no  Health Maintenance: Pap: 01-12-14 WNL (showed BV but patient did not want to treat) History of abnormal Pap:  no MMG:  01-13-14 WNL BI Rads 1 Colonoscopy:  11-09 repeat in 10 years BMD:   2009 TDaP:  Not sure Screening Labs: pending, Hb today: not done today, Urine today: +WBCs   reports that she has never smoked. She has never used smokeless tobacco. She reports that she does not drink alcohol or use illicit drugs.  Past Medical History  Diagnosis Date  . Asthma   . Hemorrhoids, external   . Abnormal pap 2000    h/o LEEP  . Schatzki's ring 1/07    of esophagus  . Depression     and anxiety  . IBS (irritable bowel syndrome)     questionable gluten allergy  . Mitral valve prolapse 2006    echo 2013-no MVP-normal valve-no regurg  . Contact lens/glasses fitting     wears contacts or glasses    Past Surgical History  Procedure Laterality Date  . Cholecystectomy  12/2005  . Cesarean section  1990  . Cesarean section  1992  . Lipoma removal  10/29/2011    abdominal wall  . Tubal ligation    . Esophageal dilation  1/07  . Hemorrhoid surgery N/A 03/01/2013    Procedure: HEMORRHOIDECTOMY;  Surgeon: Imogene Burn. Georgette Dover, MD;  Location: [place];  Service: General;  Laterality: N/A;  . Skin biopsy Left 03/01/2013    Procedure: SKIN BIOPSY  X 2  ABDOMINAL WALL;  Surgeon: Imogene Burn. Georgette Dover, MD;  Location: [place];  Service: General;  Laterality: Left;  . Leep  1999    Current Outpatient  Prescriptions  Medication Sig Dispense Refill  . albuterol (PROVENTIL HFA;VENTOLIN HFA) 108 (90 BASE) MCG/ACT inhaler Inhale 2 puffs into the lungs every 6 (six) hours as needed for wheezing. 1 Inhaler 2  . citalopram (CELEXA) 20 MG tablet TAKE 1 TABLET BY MOUTH EVERY DAY 90 tablet 2  . Levonorgestrel (MIRENA IU) by Intrauterine route.     No current facility-administered medications for this visit.    Family History  Problem Relation Age of Onset  . Heart disease Father 39    cardiac arrest/mitral valve replaced    ROS:  Pertinent items are noted in HPI.  Otherwise, a comprehensive ROS was negative.  Exam:   BP 100/62 mmHg  Pulse 72  Resp 14  Ht 5\' 8"  (1.727 m)  Wt 173 lb (78.472 kg)  BMI 26.31 kg/m2  Weight change: +4#   Height: 5\' 8"  (172.7 cm)  Ht Readings from Last 3 Encounters:  01/26/15 5\' 8"  (1.727 m)  01/12/14 5\' 8"  (1.727 m)  03/31/13 5\' 8"  (1.727 m)    General appearance: alert, cooperative and appears stated age Head: Normocephalic, without obvious abnormality, atraumatic Neck: no adenopathy, supple, symmetrical, trachea midline and thyroid normal to inspection  and palpation Lungs: clear to auscultation bilaterally Breasts: normal appearance, no masses or tenderness Heart: regular rate and rhythm Abdomen: soft, non-tender; bowel sounds normal; no masses,  no organomegaly Extremities: extremities normal, atraumatic, no cyanosis or edema Skin: Skin color, texture, turgor normal. No rashes or lesions Lymph nodes: Cervical, supraclavicular, and axillary nodes normal. No abnormal inguinal nodes palpated Neurologic: Grossly normal   Pelvic: External genitalia:  no lesions              Urethra:  normal appearing urethra with no masses, tenderness or lesions              Bartholins and Skenes: normal                 Vagina: normal appearing vagina with normal color and discharge, no lesions              Cervix: no lesions              Pap taken: Yes.   Bimanual  Exam:  Uterus:  normal size, contour, position, consistency, mobility, non-tender              Adnexa: normal adnexa and no mass, fullness, tenderness               Rectovaginal: Confirms               Anus:  normal sphincter tone, no lesions  Procedure:  IUD string noted and grasped with ringed forceps.  IUD removed with one pull.  Pt tolerated procedure well.  Speculum removed.    Chaperone was present for exam.  A:  Well Woman with normal exam  S/P hemorrhoidectomy 7/14 H/O LEEP 1999  Mirena IUD 4/11  P: Mammogram scheduled today. pap smear obtained today. H/O LEEP.  Adding CMP, Lipids, TSH, and Vit D Removal of IUD performed today return annually or prn

## 2015-01-27 LAB — VITAMIN D 25 HYDROXY (VIT D DEFICIENCY, FRACTURES): Vit D, 25-Hydroxy: 32 ng/mL (ref 30–100)

## 2015-01-29 LAB — IPS PAP TEST WITH REFLEX TO HPV

## 2015-01-30 ENCOUNTER — Other Ambulatory Visit: Payer: Self-pay | Admitting: Physical Medicine and Rehabilitation

## 2015-01-30 ENCOUNTER — Telehealth: Payer: Self-pay

## 2015-01-30 DIAGNOSIS — R928 Other abnormal and inconclusive findings on diagnostic imaging of breast: Secondary | ICD-10-CM

## 2015-01-30 NOTE — Telephone Encounter (Signed)
Lmtcb//kn 

## 2015-01-30 NOTE — Telephone Encounter (Signed)
-----   Message from Megan Salon, MD sent at 01/27/2015  1:09 PM EDT ----- Inform pt her glucose was elevated as well as her triglycerides and LDLs.  When she came for the lab work on Monday, was she fasting?  If not, probably should repeat this.  TSH was normal.

## 2015-02-01 ENCOUNTER — Other Ambulatory Visit: Payer: Self-pay | Admitting: Obstetrics & Gynecology

## 2015-02-01 DIAGNOSIS — R928 Other abnormal and inconclusive findings on diagnostic imaging of breast: Secondary | ICD-10-CM

## 2015-02-01 MED ORDER — METRONIDAZOLE 500 MG PO TABS: 500.0000 mg | ORAL_TABLET | Freq: Two times a day (BID) | ORAL | Status: DC

## 2015-02-01 NOTE — Telephone Encounter (Signed)
Returning a call to Kelly °

## 2015-02-01 NOTE — Telephone Encounter (Signed)
-----   Message from Megan Salon, MD sent at 01/29/2015  5:47 PM EDT ----- Inform pap normal.  BV noted on pap again.  If wants treatment, ok to use metrogel 7.35% one applicator qhs x 5 nights or flagyl 500mg  bid x 7 days.

## 2015-02-01 NOTE — Telephone Encounter (Signed)
Patient notified of results. States has had some itching since removing her IUD, would like to treat with pills. Flagyl rx sent through [facility]. Patient has appointment to repeat labs on 02/05/15.//kn

## 2015-02-05 ENCOUNTER — Other Ambulatory Visit (INDEPENDENT_AMBULATORY_CARE_PROVIDER_SITE_OTHER): Payer: BC Managed Care – PPO

## 2015-02-05 ENCOUNTER — Ambulatory Visit
Admission: RE | Admit: 2015-02-05 | Discharge: 2015-02-05 | Disposition: A | Discharge status: Home / Self Care | Payer: BC Managed Care – PPO | Source: Ambulatory Visit | Attending: Physical Medicine and Rehabilitation | Admitting: Physical Medicine and Rehabilitation

## 2015-02-05 DIAGNOSIS — Z0000 Encounter for general adult medical examination without abnormal findings: Secondary | ICD-10-CM

## 2015-02-05 DIAGNOSIS — R928 Other abnormal and inconclusive findings on diagnostic imaging of breast: Secondary | ICD-10-CM

## 2015-02-05 DIAGNOSIS — R899 Unspecified abnormal finding in specimens from other organs, systems and tissues: Secondary | ICD-10-CM

## 2015-02-06 LAB — LIPID PANEL
Cholesterol: 225 mg/dL — ABNORMAL HIGH (ref 0–200)
HDL: 38 mg/dL — AB (ref >=46)
LDL CALC: 157 mg/dL — AB (ref 0–99)
Total CHOL/HDL Ratio: 5.9 Ratio
Triglycerides: 148 mg/dL (ref <150)
VLDL: 30 mg/dL (ref 0–40)

## 2015-02-06 LAB — GLUCOSE, RANDOM: GLUCOSE: 96 mg/dL (ref 70–99)

## 2015-02-12 ENCOUNTER — Telehealth: Payer: Self-pay

## 2015-02-12 NOTE — Telephone Encounter (Signed)
-----   Message from Megan Salon, MD sent at 02/07/2015  6:42 AM EDT ----- Please call pt.  Due to low HDLs and elevated LDLs, I really think she needs to follow up with Dr. Dema Severin this summer and discuss if treatment is now appropriate.  Repeat glucose was normal.

## 2015-02-12 NOTE — Telephone Encounter (Signed)
Lmtcb//kn 

## 2015-04-25 ENCOUNTER — Telehealth: Payer: Self-pay | Admitting: Obstetrics & Gynecology

## 2015-04-25 NOTE — Telephone Encounter (Signed)
Spoke with patient. Patient states she is completing a wellness form for work and needs to know her cholesterol levels, glucose, and BP. Advised on 02/06/2015 cholesterol 225, HDL 38, LDL 157, Triglycerides 148, glucose 96, and blood pressure was 100/62. Patient is agreeable and will return call if she needs anything further.  Routing to provider for final review. Patient agreeable to disposition. Will close encounter.

## 2015-04-25 NOTE — Telephone Encounter (Signed)
Patient would like to have the numbers from her labwork.

## 2015-04-25 NOTE — Telephone Encounter (Signed)
Left message to call Suraj Ramdass at [phone]. 

## 2016-01-22 ENCOUNTER — Other Ambulatory Visit: Payer: Self-pay | Admitting: Obstetrics & Gynecology

## 2016-01-22 DIAGNOSIS — Z1231 Encounter for screening mammogram for malignant neoplasm of breast: Secondary | ICD-10-CM

## 2016-02-11 ENCOUNTER — Ambulatory Visit: Payer: BC Managed Care – PPO

## 2016-02-12 ENCOUNTER — Ambulatory Visit: Payer: BC Managed Care – PPO

## 2016-03-03 ENCOUNTER — Ambulatory Visit
Admission: RE | Admit: 2016-03-03 | Discharge: 2016-03-03 | Disposition: A | Discharge status: Home / Self Care | Payer: BC Managed Care – PPO | Source: Ambulatory Visit | Attending: Obstetrics & Gynecology | Admitting: Obstetrics & Gynecology

## 2016-03-03 DIAGNOSIS — Z1231 Encounter for screening mammogram for malignant neoplasm of breast: Secondary | ICD-10-CM

## 2016-04-24 ENCOUNTER — Ambulatory Visit: Payer: BC Managed Care – PPO | Admitting: Obstetrics & Gynecology

## 2016-05-13 ENCOUNTER — Institutional Professional Consult (permissible substitution): Payer: BC Managed Care – PPO | Admitting: Pulmonary Disease

## 2016-06-03 ENCOUNTER — Institutional Professional Consult (permissible substitution): Payer: BC Managed Care – PPO | Admitting: Pulmonary Disease

## 2016-07-25 ENCOUNTER — Other Ambulatory Visit: Payer: Self-pay | Admitting: Obstetrics & Gynecology

## 2016-07-25 MED ORDER — CITALOPRAM HYDROBROMIDE 20 MG PO TABS: 20.0000 mg | ORAL_TABLET | Freq: Every day | ORAL | 2 refills | Status: DC

## 2016-07-25 NOTE — Telephone Encounter (Signed)
Patient is asking for a refill of citalopram.  [facility] @ [address]. Please call patient to let know when called to her pharmacy.

## 2016-07-25 NOTE — Telephone Encounter (Signed)
Medication refill request: Citalopram Last AEX:  01/26/15 SM Next AEX: Not scheduled yet Last MMG (if hormonal medication request): 03/04/16 BIRADS1 [facility] Refill authorized: 08/21/14 #90 2R. Please advise. Thank you.

## 2016-07-28 ENCOUNTER — Telehealth: Payer: Self-pay | Admitting: Obstetrics & Gynecology

## 2016-07-28 NOTE — Telephone Encounter (Signed)
Patient calling to check status of medication refill request from Friday.

## 2016-07-28 NOTE — Telephone Encounter (Signed)
Spoke with patient and advised her that the prescription was sent out on 07/25/16. -sco

## 2017-04-16 ENCOUNTER — Other Ambulatory Visit: Payer: Self-pay | Admitting: Obstetrics & Gynecology

## 2017-04-16 DIAGNOSIS — Z1231 Encounter for screening mammogram for malignant neoplasm of breast: Secondary | ICD-10-CM

## 2017-05-05 ENCOUNTER — Ambulatory Visit: Payer: BC Managed Care – PPO

## 2017-05-14 ENCOUNTER — Ambulatory Visit
Admission: RE | Admit: 2017-05-14 | Discharge: 2017-05-14 | Disposition: A | Discharge status: Home / Self Care | Payer: BC Managed Care – PPO | Source: Ambulatory Visit | Attending: Obstetrics & Gynecology | Admitting: Obstetrics & Gynecology

## 2017-05-14 DIAGNOSIS — Z1231 Encounter for screening mammogram for malignant neoplasm of breast: Secondary | ICD-10-CM

## 2017-06-08 NOTE — Progress Notes (Deleted)
56 y.o. G2P2 Divorced Caucasian F here for annual exam.    Patient's last menstrual period was 03/05/2012.          Sexually active: No.  The current method of family planning is tubal ligation.    Exercising: Yes.    walking Smoker:  no  Health Maintenance: Pap:  01/26/15 negative, 01/12/14 negative, 12/30/12 negative History of abnormal Pap:  yes MMG:  05/14/17 BIRADS 1 negative  Colonoscopy:  06/2008- repeat 10 years BMD:   2009  TDaP:  unsure Pneumonia vaccine(s):  never Zostavax:   never Hep C testing: donates plasma regularly  Screening Labs: discuss with provider, Hb today: same   reports that she has never smoked. She has never used smokeless tobacco. She reports that she does not drink alcohol or use drugs.  Past Medical History:  Diagnosis Date  . Abnormal pap 2000   h/o LEEP  . Asthma   . Contact lens/glasses fitting    wears contacts or glasses  . Depression    and anxiety  . Hemorrhoids, external   . IBS (irritable bowel syndrome)    questionable gluten allergy  . Mitral valve prolapse 2006   echo 2013-no MVP-normal valve-no regurg  . Schatzki's ring 1/07   of esophagus    Past Surgical History:  Procedure Laterality Date  . CESAREAN SECTION  1990  . CESAREAN SECTION  1992  . CHOLECYSTECTOMY  12/2005  . ESOPHAGEAL DILATION  1/07  . HEMORRHOID SURGERY N/A 03/01/2013   Procedure: HEMORRHOIDECTOMY;  Surgeon: Imogene Burn. Georgette Dover, MD;  Location: [place];  Service: General;  Laterality: N/A;  . LEEP  1999  . lipoma removal  10/29/2011   abdominal wall  . SKIN BIOPSY Left 03/01/2013   Procedure: SKIN BIOPSY  X 2  ABDOMINAL WALL;  Surgeon: Imogene Burn. Georgette Dover, MD;  Location: [place];  Service: General;  Laterality: Left;  . TUBAL LIGATION      Current Outpatient Prescriptions  Medication Sig Dispense Refill  . albuterol (PROVENTIL HFA;VENTOLIN HFA) 108 (90 BASE) MCG/ACT inhaler Inhale 2 puffs into the lungs every 6 (six) hours as  needed for wheezing. 1 Inhaler 2  . citalopram (CELEXA) 20 MG tablet Take 1 tablet (20 mg total) by mouth daily. 90 tablet 2  . Fluticasone-Salmeterol (ADVAIR HFA IN) Inhale into the lungs.     No current facility-administered medications for this visit.     Family History  Problem Relation Age of Onset  . Heart disease Father 80       cardiac arrest/mitral valve replaced    ROS:  Pertinent items are noted in HPI.  Otherwise, a comprehensive ROS was negative.  Exam:   BP 116/82 (BP Location: Right Arm, Patient Position: Sitting, Cuff Size: Normal)   Pulse 82   Resp 14   Ht 5' 8.25" (1.734 m)   Wt 176 lb (79.8 kg)   LMP 03/05/2012   BMI 26.57 kg/m   Weight change: @WEIGHTCHANGE @ Height:   Height: 5' 8.25" (173.4 cm)  Ht Readings from Last 3 Encounters:  06/09/17 5' 8.25" (1.734 m)  01/26/15 5\' 8"  (1.727 m)  01/12/14 5\' 8"  (1.727 m)    General appearance: alert, cooperative and appears stated age Head: Normocephalic, without obvious abnormality, atraumatic Neck: no adenopathy, supple, symmetrical, trachea midline and thyroid {EXAM; THYROID:18604} Lungs: clear to auscultation bilaterally Breasts: {Exam; breast:13139::"normal appearance, no masses or tenderness"} Heart: regular rate and rhythm Abdomen: soft, non-tender; bowel sounds normal; no masses,  no organomegaly Extremities: extremities normal, atraumatic, no cyanosis or edema Skin: Skin color, texture, turgor normal. No rashes or lesions Lymph nodes: Cervical, supraclavicular, and axillary nodes normal. No abnormal inguinal nodes palpated Neurologic: Grossly normal   Pelvic: External genitalia:  no lesions              Urethra:  normal appearing urethra with no masses, tenderness or lesions              Bartholins and Skenes: normal                 Vagina: normal appearing vagina with normal color and discharge, no lesions              Cervix: {exam; cervix:14595}              Pap taken: {yes no:314532} Bimanual  Exam:  Uterus:  {exam; uterus:12215}              Adnexa: {exam; adnexa:12223}               Rectovaginal: Confirms               Anus:  normal sphincter tone, no lesions  Chaperone was present for exam.  A:  Well Woman with normal exam  P:   {plan; gyn:5269::"mammogram","pap smear","return annually or prn"}

## 2017-06-09 ENCOUNTER — Encounter: Payer: Self-pay | Admitting: Obstetrics & Gynecology

## 2017-06-09 ENCOUNTER — Encounter (INDEPENDENT_AMBULATORY_CARE_PROVIDER_SITE_OTHER): Payer: BC Managed Care – PPO | Admitting: Obstetrics & Gynecology

## 2017-06-09 NOTE — Progress Notes (Signed)
Pt needed to return to work.  Left before being seen.

## 2017-06-11 NOTE — Progress Notes (Signed)
56 y.o. G2P2 SingleCaucasianF here for annual exam.  Doing well.  Has been having more issues with asthmatic bronchitis this year.  Has been seen in the past for allergy testing.  Thinks she has some lactose intolerance.  Hasn't seen pulmonologist.  Uses Advair and Albuterol.    Denies vaginal bleeding.    Went on mission trip to [place] this summer about 9 days.    Patient's last menstrual period was 03/05/2012.          Sexually active: No.  The current method of family planning is tubal ligation.    Exercising: Yes.    walking Smoker:  no  Health Maintenance: Pap:  01/26/15 Neg   01/12/14 Neg  History of abnormal Pap:  Yes, LEEP 2000 MMG:  05/15/17 BIRADS1:neg  Colonoscopy:  06/2008 f/u 10 years  BMD:   2009 TDaP:  unsure Pneumonia vaccine(s):  n/a Zostavax:   no Hep C testing: donates blood  Screening Labs: if needed.    reports that she has never smoked. She has never used smokeless tobacco. She reports that she does not drink alcohol or use drugs.  Past Medical History:  Diagnosis Date  . Abnormal pap 2000   h/o LEEP  . Asthma   . Contact lens/glasses fitting    wears contacts or glasses  . Depression    and anxiety  . Hemorrhoids, external   . IBS (irritable bowel syndrome)    questionable gluten allergy  . Mitral valve prolapse 2006   echo 2013-no MVP-normal valve-no regurg  . Schatzki's ring 1/07   of esophagus    Past Surgical History:  Procedure Laterality Date  . CESAREAN SECTION  1990  . CESAREAN SECTION  1992  . CHOLECYSTECTOMY  12/2005  . ESOPHAGEAL DILATION  1/07  . HEMORRHOID SURGERY N/A 03/01/2013   Procedure: HEMORRHOIDECTOMY;  Surgeon: Imogene Burn. Georgette Dover, MD;  Location: [facility];  Service: General;  Laterality: N/A;  . LEEP  1999  . lipoma removal  10/29/2011   abdominal wall  . SKIN BIOPSY Left 03/01/2013   Procedure: SKIN BIOPSY  X 2  ABDOMINAL WALL;  Surgeon: Imogene Burn. Georgette Dover, MD;  Location: [facility];  Service:  General;  Laterality: Left;  . TUBAL LIGATION      Current Outpatient Prescriptions  Medication Sig Dispense Refill  . albuterol (PROVENTIL HFA;VENTOLIN HFA) 108 (90 BASE) MCG/ACT inhaler Inhale 2 puffs into the lungs every 6 (six) hours as needed for wheezing. 1 Inhaler 2  . citalopram (CELEXA) 20 MG tablet Take 1 tablet (20 mg total) by mouth daily. 90 tablet 2  . Fluticasone-Salmeterol (ADVAIR HFA IN) Inhale into the lungs.     No current facility-administered medications for this visit.     Family History  Problem Relation Age of Onset  . Heart disease Father 69       cardiac arrest/mitral valve replaced    ROS:  Pertinent items are noted in HPI.  Otherwise, a comprehensive ROS was negative.  Exam:   BP 110/78 (BP Location: Right Arm, Patient Position: Sitting, Cuff Size: Normal)   Pulse 70   Resp 16   Ht 5' 8.25" (1.734 m)   Wt 176 lb (79.8 kg)   LMP 03/05/2012   BMI 26.57 kg/m   Weight change: @WEIGHTCHANGE @ Height:   Height: 5' 8.25" (173.4 cm)  Ht Readings from Last 3 Encounters:  06/12/17 5' 8.25" (1.734 m)  06/09/17 5' 8.25" (1.734 m)  01/26/15 5\' 8"  (1.727  m)    General appearance: alert, cooperative and appears stated age Head: Normocephalic, without obvious abnormality, atraumatic Neck: no adenopathy, supple, symmetrical, trachea midline and thyroid normal to inspection and palpation Lungs: clear to auscultation bilaterally Breasts: normal appearance, no masses or tenderness Heart: regular rate and rhythm Abdomen: soft, non-tender; bowel sounds normal; no masses,  no organomegaly Extremities: extremities normal, atraumatic, no cyanosis or edema Skin: Skin color, texture, turgor normal. No rashes or lesions Lymph nodes: Cervical, supraclavicular, and axillary nodes normal. No abnormal inguinal nodes palpated Neurologic: Grossly normal  Pelvic: External genitalia:  no lesions              Urethra:  normal appearing urethra with no masses, tenderness or  lesions              Bartholins and Skenes: normal                 Vagina: normal appearing vagina with normal color and discharge, no lesions              Cervix: no lesions              Pap taken: Yes.   Bimanual Exam:  Uterus:  normal size, contour, position, consistency, mobility, non-tender              Adnexa: normal adnexa and no mass, fullness, tenderness               Rectovaginal: Confirms               Anus:  normal sphincter tone, no lesions  Chaperone was present for exam.  A:  Well Woman with normal exam PMP, no HRT H/O LEEP 1999 H/O Mirena IUD use, removed 2016  P:   Mammogram guidelines reviewed pap smear and HR HPV obtained today CMP, Lipids, CMP obtained Celexa 20mg  daily.  #90/4RF Return annually or prn

## 2017-06-12 ENCOUNTER — Encounter: Payer: Self-pay | Admitting: Obstetrics & Gynecology

## 2017-06-12 ENCOUNTER — Other Ambulatory Visit (HOSPITAL_COMMUNITY)
Admission: RE | Admit: 2017-06-12 | Discharge: 2017-06-12 | Disposition: A | Discharge status: Home / Self Care | Payer: BC Managed Care – PPO | Source: Ambulatory Visit | Attending: Obstetrics & Gynecology | Admitting: Obstetrics & Gynecology

## 2017-06-12 ENCOUNTER — Ambulatory Visit (INDEPENDENT_AMBULATORY_CARE_PROVIDER_SITE_OTHER): Payer: BC Managed Care – PPO | Admitting: Obstetrics & Gynecology

## 2017-06-12 VITALS — BP 110/78 | HR 70 | Resp 16 | Ht 68.25 in | Wt 176.0 lb

## 2017-06-12 DIAGNOSIS — Z01419 Encounter for gynecological examination (general) (routine) without abnormal findings: Secondary | ICD-10-CM | POA: Diagnosis not present

## 2017-06-12 DIAGNOSIS — Z0000 Encounter for general adult medical examination without abnormal findings: Secondary | ICD-10-CM | POA: Insufficient documentation

## 2017-06-12 DIAGNOSIS — Z124 Encounter for screening for malignant neoplasm of cervix: Secondary | ICD-10-CM

## 2017-06-12 MED ORDER — CITALOPRAM HYDROBROMIDE 20 MG PO TABS: 20.0000 mg | ORAL_TABLET | Freq: Every day | ORAL | 4 refills | Status: DC

## 2017-06-13 LAB — COMPREHENSIVE METABOLIC PANEL
ALBUMIN: 4.9 g/dL (ref 3.5–5.5)
ALT: 12 IU/L (ref 0–32)
AST: 15 IU/L (ref 0–40)
Albumin/Globulin Ratio: 1.7 (ref 1.2–2.2)
Alkaline Phosphatase: 104 IU/L (ref 39–117)
BUN / CREAT RATIO: 19 (ref 9–23)
BUN: 13 mg/dL (ref 6–24)
Bilirubin Total: 0.4 mg/dL (ref 0.0–1.2)
CALCIUM: 9.9 mg/dL (ref 8.7–10.2)
CO2: 24 mmol/L (ref 20–29)
CREATININE: 0.7 mg/dL (ref 0.57–1.00)
Chloride: 101 mmol/L (ref 96–106)
GFR, EST AFRICAN AMERICAN: 112 mL/min/{1.73_m2} (ref >59)
GFR, EST NON AFRICAN AMERICAN: 97 mL/min/{1.73_m2} (ref >59)
GLOBULIN, TOTAL: 2.9 g/dL (ref 1.5–4.5)
Glucose: 86 mg/dL (ref 65–99)
Potassium: 4.1 mmol/L (ref 3.5–5.2)
SODIUM: 143 mmol/L (ref 134–144)
TOTAL PROTEIN: 7.8 g/dL (ref 6.0–8.5)

## 2017-06-13 LAB — CBC
HEMOGLOBIN: 14 g/dL (ref 11.1–15.9)
Hematocrit: 42.6 % (ref 34.0–46.6)
MCH: 27.3 pg (ref 26.6–33.0)
MCHC: 32.9 g/dL (ref 31.5–35.7)
MCV: 83 fL (ref 79–97)
PLATELETS: 255 10*3/uL (ref 150–379)
RBC: 5.12 x10E6/uL (ref 3.77–5.28)
RDW: 13.8 % (ref 12.3–15.4)
WBC: 8.2 10*3/uL (ref 3.4–10.8)

## 2017-06-13 LAB — LIPID PANEL
CHOLESTEROL TOTAL: 232 mg/dL — AB (ref 100–199)
Chol/HDL Ratio: 5.2 ratio — ABNORMAL HIGH (ref 0.0–4.4)
HDL: 45 mg/dL (ref >39)
LDL Calculated: 158 mg/dL — ABNORMAL HIGH (ref 0–99)
Triglycerides: 143 mg/dL (ref 0–149)
VLDL Cholesterol Cal: 29 mg/dL (ref 5–40)

## 2017-06-15 ENCOUNTER — Telehealth: Payer: Self-pay | Admitting: *Deleted

## 2017-06-15 NOTE — Telephone Encounter (Signed)
-----   Message from Megan Salon, MD sent at 06/15/2017  1:04 PM EDT ----- Please let pt know her CBC and CMP were normal.  Cholesterol was elevated with total cholesterol 232 and LDL 158.  This is the range where treatment is often recommended.  Does she want me to send copies of this to her PCP?

## 2017-06-15 NOTE — Telephone Encounter (Signed)
Detailed message left per DPR. Advised patient to return call to let our office know if she would like a copy of labs sent to PCP.

## 2017-06-16 LAB — CYTOLOGY - PAP
DIAGNOSIS: NEGATIVE
HPV (WINDOPATH): DETECTED — AB
HPV 16/18/45 genotyping: NEGATIVE

## 2017-07-13 NOTE — Telephone Encounter (Signed)
Encounter closed

## 2017-12-02 ENCOUNTER — Telehealth: Payer: Self-pay | Admitting: Obstetrics & Gynecology

## 2017-12-02 NOTE — Telephone Encounter (Signed)
Patient is having some chest pains which she says is not bad but she would like Dr Sabra Heck to recommend a cardiologist.

## 2017-12-02 NOTE — Telephone Encounter (Signed)
Spoke with patient. Patient reports chest "tightness", for the past several weeks. Reports increase stress due to "family issues". Hx of MVP and costochondritis. No PCP, requesting referral to cardiologist.  Denies SOB, weakness, pain, dizziness, fatigue, lightheadedness, N/V.  Instructed patient to go to ER for further evaluation. Can assist with PCP of needed after further evaluation of chest tightness. Patient states "I will call PCP I have in mind" for appointment.   Again recommended patient go to ER for further evaluation of symptoms if can not be seen immediately by PCP. Advised patient Dr. Sabra Heck will review, our office will return call with any additional recommendations.   Routing to provider for final review. Patient is agreeable to disposition. Will close encounter.

## 2017-12-02 NOTE — Telephone Encounter (Signed)
Left message to call Chevelle Coulson at [phone].  

## 2018-06-21 ENCOUNTER — Ambulatory Visit: Payer: BC Managed Care – PPO | Admitting: Obstetrics & Gynecology

## 2018-06-21 NOTE — Progress Notes (Deleted)
57 y.o. G2P2 Single White or Caucasian female here for annual exam.    Patient's last menstrual period was 03/05/2012.          Sexually active: {yes no:314532}  The current method of family planning is tubal ligation.    Exercising: {yes no:314532}  {types:19826} Smoker:  {YES P5382123  Health Maintenance: Pap:  06/12/17 Neg. HR HPV:+detected. #16, 18/45 Neg   01/26/15 Neg  History of abnormal Pap:  Yes, LEEP MMG:  05/14/17 BIRADS1:Neg  Colonoscopy:  *** BMD:   *** TDaP:  *** Pneumonia vaccine(s):  *** Shingrix:   *** Hep C testing: *** Screening Labs: ***, Hb today: ***, Urine today: ***   reports that she has never smoked. She has never used smokeless tobacco. She reports that she does not drink alcohol or use drugs.  Past Medical History:  Diagnosis Date  . Abnormal pap 2000   h/o LEEP  . Asthma   . Contact lens/glasses fitting    wears contacts or glasses  . Depression    and anxiety  . Hemorrhoids, external   . IBS (irritable bowel syndrome)    questionable gluten allergy  . Mitral valve prolapse 2006   echo 2013-no MVP-normal valve-no regurg  . Schatzki's ring 1/07   of esophagus    Past Surgical History:  Procedure Laterality Date  . CESAREAN SECTION  1990  . CESAREAN SECTION  1992  . CHOLECYSTECTOMY  12/2005  . ESOPHAGEAL DILATION  1/07  . HEMORRHOID SURGERY N/A 03/01/2013   Procedure: HEMORRHOIDECTOMY;  Surgeon: Imogene Burn. Georgette Dover, MD;  Location: [facility];  Service: General;  Laterality: N/A;  . LEEP  1999  . lipoma removal  10/29/2011   abdominal wall  . SKIN BIOPSY Left 03/01/2013   Procedure: SKIN BIOPSY  X 2  ABDOMINAL WALL;  Surgeon: Imogene Burn. Georgette Dover, MD;  Location: [facility];  Service: General;  Laterality: Left;  . TUBAL LIGATION      Current Outpatient Medications  Medication Sig Dispense Refill  . albuterol (PROVENTIL HFA;VENTOLIN HFA) 108 (90 BASE) MCG/ACT inhaler Inhale 2 puffs into the lungs every 6 (six) hours  as needed for wheezing. 1 Inhaler 2  . citalopram (CELEXA) 20 MG tablet Take 1 tablet (20 mg total) by mouth daily. 90 tablet 4  . Fluticasone-Salmeterol (ADVAIR HFA IN) Inhale into the lungs.     No current facility-administered medications for this visit.     Family History  Problem Relation Age of Onset  . Heart disease Father 47       cardiac arrest/mitral valve replaced    Review of Systems  Exam:   LMP 03/05/2012   Height:      Ht Readings from Last 3 Encounters:  06/12/17 5' 8.25" (1.734 m)  06/09/17 5' 8.25" (1.734 m)  01/26/15 5\' 8"  (1.727 m)    General appearance: alert, cooperative and appears stated age Head: Normocephalic, without obvious abnormality, atraumatic Neck: no adenopathy, supple, symmetrical, trachea midline and thyroid {EXAM; THYROID:18604} Lungs: clear to auscultation bilaterally Breasts: {Exam; breast:13139::"normal appearance, no masses or tenderness"} Heart: regular rate and rhythm Abdomen: soft, non-tender; bowel sounds normal; no masses,  no organomegaly Extremities: extremities normal, atraumatic, no cyanosis or edema Skin: Skin color, texture, turgor normal. No rashes or lesions Lymph nodes: Cervical, supraclavicular, and axillary nodes normal. No abnormal inguinal nodes palpated Neurologic: Grossly normal   Pelvic: External genitalia:  no lesions  Urethra:  normal appearing urethra with no masses, tenderness or lesions              Bartholins and Skenes: normal                 Vagina: normal appearing vagina with normal color and discharge, no lesions              Cervix: {exam; cervix:14595}              Pap taken: {yes no:314532} Bimanual Exam:  Uterus:  {exam; uterus:12215}              Adnexa: {exam; adnexa:12223}               Rectovaginal: Confirms               Anus:  normal sphincter tone, no lesions  Chaperone was present for exam.  A:  Well Woman with normal exam  P:   {plan; gyn:5269::"mammogram","pap  smear","return annually or prn"}

## 2018-07-05 ENCOUNTER — Other Ambulatory Visit: Payer: Self-pay | Admitting: Obstetrics & Gynecology

## 2018-07-05 DIAGNOSIS — Z1231 Encounter for screening mammogram for malignant neoplasm of breast: Secondary | ICD-10-CM

## 2018-07-13 ENCOUNTER — Encounter: Payer: Self-pay | Admitting: Obstetrics & Gynecology

## 2018-07-13 ENCOUNTER — Other Ambulatory Visit: Payer: Self-pay | Admitting: *Deleted

## 2018-07-13 ENCOUNTER — Ambulatory Visit (INDEPENDENT_AMBULATORY_CARE_PROVIDER_SITE_OTHER): Payer: BC Managed Care – PPO | Admitting: Obstetrics & Gynecology

## 2018-07-13 ENCOUNTER — Other Ambulatory Visit: Payer: Self-pay

## 2018-07-13 ENCOUNTER — Other Ambulatory Visit (HOSPITAL_COMMUNITY)
Admission: RE | Admit: 2018-07-13 | Discharge: 2018-07-13 | Disposition: A | Discharge status: Home / Self Care | Payer: BC Managed Care – PPO | Source: Ambulatory Visit | Attending: Obstetrics & Gynecology | Admitting: Obstetrics & Gynecology

## 2018-07-13 VITALS — BP 144/70 | HR 110 | Resp 16 | Ht 67.5 in | Wt 171.0 lb

## 2018-07-13 DIAGNOSIS — Z01419 Encounter for gynecological examination (general) (routine) without abnormal findings: Secondary | ICD-10-CM

## 2018-07-13 DIAGNOSIS — J45909 Unspecified asthma, uncomplicated: Secondary | ICD-10-CM

## 2018-07-13 DIAGNOSIS — Z0000 Encounter for general adult medical examination without abnormal findings: Secondary | ICD-10-CM | POA: Diagnosis not present

## 2018-07-13 DIAGNOSIS — E785 Hyperlipidemia, unspecified: Secondary | ICD-10-CM | POA: Diagnosis not present

## 2018-07-13 DIAGNOSIS — Z124 Encounter for screening for malignant neoplasm of cervix: Secondary | ICD-10-CM

## 2018-07-13 DIAGNOSIS — E559 Vitamin D deficiency, unspecified: Secondary | ICD-10-CM

## 2018-07-13 MED ORDER — CITALOPRAM HYDROBROMIDE 20 MG PO TABS: 20.0000 mg | ORAL_TABLET | Freq: Every day | ORAL | 4 refills | Status: DC

## 2018-07-13 NOTE — Progress Notes (Signed)
Patient scheduled while in office. Spoke with [place] at [facility]. Patient scheduled on 07/20/18 at 4:15pm with Dr. Melvyn Novas.  [address], [place], [place]. [phone]. Patient verbalizes understanding and is agreeable.

## 2018-07-13 NOTE — Progress Notes (Signed)
57 y.o. G2P2 Single White or Caucasian female here for annual exam.  Feeling some anxiety today.  Using her inhaler a lot more over the last two months.  Reports she's using this two or three times a day.  Has seen a pulmonologist in the past.    Denies vaginal bleeding.    Having issues with son, Rodman Key, who has basically separated himself from the family.  Very sad for the patient.    Patient's last menstrual period was 03/05/2012.          Sexually active: Yes.    The current method of family planning is tubal ligation.    Exercising: Yes.    walking Smoker:  no  Health Maintenance: Pap:  06/12/17 Neg. HR HPV:+detected. #16, 18/45 neg   01/26/15 Neg History of abnormal Pap:  Yes, LEEP MMG:  05/14/17 BIRADS1:Neg. Has appt 08/23/18 Colonoscopy:  06/2008 polyp.  Aware this is  BMD:   2009 TDaP:  unsure Pneumonia vaccine(s):  n/a Shingrix:   No Hep C testing: donated blood in the past Screening Labs: Here    reports that she has never smoked. She has never used smokeless tobacco. She reports that she does not drink alcohol or use drugs.  Past Medical History:  Diagnosis Date  . Abnormal pap 2000   h/o LEEP  . Asthma   . Contact lens/glasses fitting    wears contacts or glasses  . Depression    and anxiety  . Hemorrhoids, external   . IBS (irritable bowel syndrome)    questionable gluten allergy  . Mitral valve prolapse 2006   echo 2013-no MVP-normal valve-no regurg  . Schatzki's ring 1/07   of esophagus    Past Surgical History:  Procedure Laterality Date  . CESAREAN SECTION  1990  . CESAREAN SECTION  1992  . CHOLECYSTECTOMY  12/2005  . ESOPHAGEAL DILATION  1/07  . HEMORRHOID SURGERY N/A 03/01/2013   Procedure: HEMORRHOIDECTOMY;  Surgeon: Imogene Burn. Georgette Dover, MD;  Location: [facility];  Service: General;  Laterality: N/A;  . LEEP  1999  . lipoma removal  10/29/2011   abdominal wall  . SKIN BIOPSY Left 03/01/2013   Procedure: SKIN BIOPSY  X 2  ABDOMINAL  WALL;  Surgeon: Imogene Burn. Georgette Dover, MD;  Location: [facility];  Service: General;  Laterality: Left;  . TUBAL LIGATION      Current Outpatient Medications  Medication Sig Dispense Refill  . albuterol (PROVENTIL HFA;VENTOLIN HFA) 108 (90 BASE) MCG/ACT inhaler Inhale 2 puffs into the lungs every 6 (six) hours as needed for wheezing. 1 Inhaler 2  . cetirizine-pseudoephedrine (ZYRTEC-D) 5-120 MG tablet Take by mouth daily as needed.    . citalopram (CELEXA) 20 MG tablet Take 1 tablet (20 mg total) by mouth daily. 90 tablet 4  . Fluticasone-Salmeterol (ADVAIR HFA IN) Inhale into the lungs.     No current facility-administered medications for this visit.     Family History  Problem Relation Age of Onset  . Heart disease Father 71       cardiac arrest/mitral valve replaced    Review of Systems  All other systems reviewed and are negative.   Exam:   BP (!) 144/70 (BP Location: Right Arm, Patient Position: Sitting, Cuff Size: Large)   Pulse (!) 110   Resp 16   Ht 5' 7.5" (1.715 m)   Wt 171 lb (77.6 kg)   LMP 03/05/2012   BMI 26.39 kg/m   Height: 5' 7.5" (  171.5 cm)  Ht Readings from Last 3 Encounters:  07/13/18 5' 7.5" (1.715 m)  06/12/17 5' 8.25" (1.734 m)  06/09/17 5' 8.25" (1.734 m)    General appearance: alert, cooperative and appears stated age Head: Normocephalic, without obvious abnormality, atraumatic Neck: no adenopathy, supple, symmetrical, trachea midline and thyroid normal to inspection and palpation Lungs: clear to auscultation bilaterally Breasts: normal appearance, no masses or tenderness Heart: regular rate and rhythm Abdomen: soft, non-tender; bowel sounds normal; no masses,  no organomegaly Extremities: extremities normal, atraumatic, no cyanosis or edema Skin: Skin color, texture, turgor normal. No rashes or lesions Lymph nodes: Cervical, supraclavicular, and axillary nodes normal. No abnormal inguinal nodes palpated Neurologic: Grossly  normal  Pelvic: External genitalia:  no lesions              Urethra:  normal appearing urethra with no masses, tenderness or lesions              Bartholins and Skenes: normal                 Vagina: normal appearing vagina with normal color and discharge, no lesions              Cervix: no lesions              Pap taken: Yes.   Bimanual Exam:  Uterus:  normal size, contour, position, consistency, mobility, non-tender              Adnexa: normal adnexa and no mass, fullness, tenderness               Rectovaginal: Confirms               Anus:  normal sphincter tone, no lesions  Chaperone was present for exam.  A:  Well Woman with normal exam PMP, no HRT H/o LEEP 1999 +HR HPV on pap 2018 H/O Mirena IUD use, removed 2016 Increased albuterol inhaler use  P:   Mammogram guidelines reviewed.  Has appt scheduled for 08/2018 Pt aware colonoscopy is due.  Declines doing it this year.   pap smear and HR HPV obtained today RF for Celexa 20mg  daily.  #90/4RF CMP, Lipids, TSH, Vit D Referral to pulmonologist return annually or prn

## 2018-07-14 ENCOUNTER — Other Ambulatory Visit: Payer: Self-pay | Admitting: *Deleted

## 2018-07-14 LAB — COMPREHENSIVE METABOLIC PANEL
ALBUMIN: 4.1 g/dL (ref 3.5–5.5)
ALK PHOS: 89 IU/L (ref 39–117)
ALT: 16 IU/L (ref 0–32)
AST: 18 IU/L (ref 0–40)
Albumin/Globulin Ratio: 1.9 (ref 1.2–2.2)
BILIRUBIN TOTAL: 0.3 mg/dL (ref 0.0–1.2)
BUN / CREAT RATIO: 18 (ref 9–23)
BUN: 13 mg/dL (ref 6–24)
CHLORIDE: 101 mmol/L (ref 96–106)
CO2: 25 mmol/L (ref 20–29)
Calcium: 9.4 mg/dL (ref 8.7–10.2)
Creatinine, Ser: 0.71 mg/dL (ref 0.57–1.00)
GFR calc non Af Amer: 95 mL/min/{1.73_m2} (ref >59)
GFR, EST AFRICAN AMERICAN: 109 mL/min/{1.73_m2} (ref >59)
Globulin, Total: 2.2 g/dL (ref 1.5–4.5)
Glucose: 95 mg/dL (ref 65–99)
Potassium: 4 mmol/L (ref 3.5–5.2)
SODIUM: 140 mmol/L (ref 134–144)
TOTAL PROTEIN: 6.3 g/dL (ref 6.0–8.5)

## 2018-07-14 LAB — LIPID PANEL
CHOLESTEROL TOTAL: 216 mg/dL — AB (ref 100–199)
Chol/HDL Ratio: 5.5 ratio — ABNORMAL HIGH (ref 0.0–4.4)
HDL: 39 mg/dL — AB (ref >39)
LDL Calculated: 136 mg/dL — ABNORMAL HIGH (ref 0–99)
Triglycerides: 205 mg/dL — ABNORMAL HIGH (ref 0–149)
VLDL Cholesterol Cal: 41 mg/dL — ABNORMAL HIGH (ref 5–40)

## 2018-07-14 LAB — TSH: TSH: 2.25 u[IU]/mL (ref 0.450–4.500)

## 2018-07-14 LAB — VITAMIN D 25 HYDROXY (VIT D DEFICIENCY, FRACTURES): VIT D 25 HYDROXY: 18.7 ng/mL — AB (ref 30.0–100.0)

## 2018-07-14 MED ORDER — VITAMIN D (ERGOCALCIFEROL) 1.25 MG (50000 UNIT) PO CAPS: 50000.0000 [IU] | ORAL_CAPSULE | ORAL | 0 refills | Status: DC

## 2018-07-14 NOTE — Addendum Note (Signed)
Addended by: Megan Salon on: 07/14/2018 12:25 PM   Modules accepted: Orders

## 2018-07-20 ENCOUNTER — Ambulatory Visit (INDEPENDENT_AMBULATORY_CARE_PROVIDER_SITE_OTHER): Payer: BC Managed Care – PPO | Admitting: Internal Medicine

## 2018-07-20 ENCOUNTER — Encounter: Payer: Self-pay | Admitting: Internal Medicine

## 2018-07-20 DIAGNOSIS — J4530 Mild persistent asthma, uncomplicated: Secondary | ICD-10-CM | POA: Diagnosis not present

## 2018-07-20 DIAGNOSIS — R0609 Other forms of dyspnea: Secondary | ICD-10-CM

## 2018-07-20 LAB — CYTOLOGY - PAP
Diagnosis: NEGATIVE
HPV: NOT DETECTED

## 2018-07-20 LAB — NITRIC OXIDE: Nitric Oxide: 75

## 2018-07-20 MED ORDER — BUDESONIDE-FORMOTEROL FUMARATE 160-4.5 MCG/ACT IN AERO: 2.0000 | INHALATION_SPRAY | Freq: Two times a day (BID) | RESPIRATORY_TRACT | 0 refills | Status: DC

## 2018-07-20 MED ORDER — BUDESONIDE-FORMOTEROL FUMARATE 160-4.5 MCG/ACT IN AERO: 2.0000 | INHALATION_SPRAY | Freq: Two times a day (BID) | RESPIRATORY_TRACT | 11 refills | Status: DC

## 2018-07-20 NOTE — Patient Instructions (Signed)
Plan A = Automatic = Symbicort 160 Take 2 puffs first thing in am and then another 2 puffs about 12 hours later.     Work on inhaler technique:  relax and gently blow all the way out then take a nice smooth deep breath back in, triggering the inhaler at same time you start breathing in.  Hold for up to 5 seconds if you can. Blow out thru nose. Rinse and gargle with water when done      Plan B = Backup Only use your albuterol inhaler as a rescue medication to be used if you can't catch your breath by resting or doing a relaxed purse lip breathing pattern.  - The less you use it, the better it will work when you need it. - Ok to use the inhaler up to 2 puffs  every 4 hours if you must but call for appointment if use goes up over your usual need - Don't leave home without it !!  (think of it like the spare tire for your car)    Please schedule a follow up office visit in 4 weeks, sooner if needed  with all medications /inhalers/ solutions in hand so we can verify exactly what you are taking. This includes all medications from all doctors and over the counters

## 2018-07-20 NOTE — Progress Notes (Signed)
Nicole Fleming, female    DOB: 1960-10-14,    MRN: [id number]   Brief patient profile:  6 yowf never smoker with h/o allergies as child = itching/sneezing not wheezing took shots as a teenager  in [facility] and shots helped and everything was fine until around 2009 started having  breathing attacks in winter time >  to [place] multiple times and rx with prn saba then eval by [facility] > dermatographia so ran RAST neg > never went back and reports since then using more and more albuterol and freq prednisone. Was prescribed advair but apparently never took it and referred to pulmonary clinic 07/20/2018 by Dr   Edwinna Areola    History of Present Illness  07/20/2018  Pulmonary/ 1st office eval/Nicole Fleming  Chief Complaint  Patient presents with  . Pulmonary Consult    Referred by Dr. Edwinna Areola. Pt c/o SOB x 6 months.  She states SOB can occue with or without exertion.  She is using her albuterol inhaler 2-4 x per day.   Dyspnea:  MMRC1 = can walk nl pace, flat grade, can't hurry or go uphills or steps s sob  Episodes of sob at rest resolve w/in 5 min p   saba varies up to 4 x daily max assoc with chest tightness than never comes on with ex  Walks dog up to [place] loop some hills/ all conditions   Cough: dry cough x 10 years - more day than noct - wakes her up  Maybe once a week Sleep: ok to lie flat sound sleep / 4/7 nights at bedtime feels need for hs saba also  SABA use: freq needs  p supper o/w no pattern to daytime use    No obvious day to day or daytime variability or assoc excess/ purulent sputum or mucus plugs or hemoptysis or cp  , subjective wheeze or overt sinus or hb symptoms.   Sleeping as above  Usually without nocturnal  or early am exacerbation  of respiratory  c/o's or need for noct saba. Also denies any obvious fluctuation of symptoms with weather or environmental changes or other aggravating or alleviating factors except as outlined above   No unusual exposure hx or h/o  childhood pna/ asthma or knowledge of premature birth.  Current Allergies, Complete Past Medical History, Past Surgical History, Family History, and Social History were reviewed in [employer] [facility] record.  ROS  The following are not active complaints unless bolded Hoarseness, sore throat, dysphagia, dental problems, itching, sneezing,  nasal congestion or discharge of excess mucus or purulent secretions, ear ache,   fever, chills, sweats, unintended wt loss or wt gain, classically pleuritic or exertional cp,  orthopnea pnd or arm/hand swelling  or leg swelling, presyncope, palpitations, abdominal pain, anorexia, nausea, vomiting, diarrhea  or change in bowel habits or change in bladder habits, change in stools or change in urine, dysuria, hematuria,  rash, arthralgias, visual complaints, headache, numbness, weakness or ataxia or problems with walking or coordination,  change in mood or  memory.              Past Medical History:  Diagnosis Date  . Abnormal pap 2000   h/o LEEP  . Asthma   . Contact lens/glasses fitting    wears contacts or glasses  . Depression    and anxiety  . Hemorrhoids, external   . IBS (irritable bowel syndrome)    questionable gluten allergy  . Mitral valve prolapse 2006  echo 2013-no MVP-normal valve-no regurg  . Schatzki's ring 1/07   of esophagus    Outpatient Medications Prior to Visit  Medication Sig Dispense Refill  . albuterol (PROVENTIL HFA;VENTOLIN HFA) 108 (90 BASE) MCG/ACT inhaler Inhale 2 puffs into the lungs every 6 (six) hours as needed for wheezing. 1 Inhaler 2  . cetirizine-pseudoephedrine (ZYRTEC-D) 5-120 MG tablet Take by mouth daily as needed.    . citalopram (CELEXA) 20 MG tablet Take 1 tablet (20 mg total) by mouth daily. 90 tablet 4  . Vitamin D, Ergocalciferol, (DRISDOL) 1.25 MG (50000 UT) CAPS capsule Take 1 capsule (50,000 Units total) by mouth every 7 (seven) days. (Patient not taking: Reported on 07/20/2018) 12  capsule 0  .              Objective:     BP 120/82 (BP Location: Left Arm, Cuff Size: Normal)   Pulse 72   Ht 5' 7.5" (1.715 m)   Wt 171 lb 9.6 oz (77.8 kg)   LMP 03/05/2012   SpO2 98%   BMI 26.48 kg/m   SpO2: 98 %   RA   Stoic amb wf nad  HEENT: nl dentition, and oropharynx. Nl external ear canals without cough reflex - mod severe  bilateral non-specific turbinate edema     NECK :  without JVD/Nodes/TM/ nl carotid upstrokes bilaterally   LUNGS: no acc muscle use,  Nl contour chest which is clear to A and P bilaterally without cough on insp or exp maneuvers   CV:  RRR  no s3 or murmur or increase in P2, and no edema   ABD:  soft and nontender with nl inspiratory excursion in the supine position. No bruits or organomegaly appreciated, bowel sounds nl  MS:  Nl gait/ ext warm without deformities, calf tenderness, cyanosis or clubbing No obvious joint restrictions   SKIN: warm and dry without lesions    NEURO:  alert, approp, nl sensorium with  no motor or cerebellar deficits apparent.       Assessment   DOE (dyspnea on exertion) Spirometry 07/20/2018  FEV1 2.4 (81%)  Ratio 70 min curvature off saba on day of study   - 07/20/2018   Walked RA  2 laps @ 247ft each @ fast pace  stopped due to  Endo of study, min sob  This is likely a conditioning issue - see asthma      Asthma, chronic, mild persistent, uncomplicated FENO 18/12/6312  =   75 Spirometry 07/20/2018  FEV1 2.4 (81%)  Ratio 70 min curvature off saba on day of study   - 07/20/2018  After extensive coaching inhaler device,  effectiveness =    75% > try symbiocort 160 2bid   Way over using saba daily x years so clearly goals of asthma not being me with high feno suggesting eos asthma despite neg studies from [employer] per pt report   DDX of  difficult airways management almost all start with A and  include Adherence, Ace Inhibitors, Acid Reflux, Active Sinus Disease, Alpha 1 Antitripsin deficiency, Anxiety  masquerading as Airways dz,  ABPA,  Allergy(esp in young), Aspiration (esp in elderly), Adverse effects of meds,  Active smoking or vaping, A bunch of PE's (a small clot burden can't cause this syndrome unless there is already severe underlying pulm or vascular dz with poor reserve) plus two Bs  = Bronchiectasis and Beta blocker use..and one C= CHF   Adherence is always the initial "prime suspect" and is a  multilayered concern that requires a "trust but verify" approach in every patient - starting with knowing how to use medications, especially inhalers, correctly, keeping up with refills and understanding the fundamental difference between maintenance and prns vs those medications only taken for a very short course and then stopped and not refilled.  - see hfa teaching - return with all meds in hand using a trust but verify approach to confirm accurate Medication  Reconciliation The principal here is that until we are certain that the  patients are doing what we've asked, it makes no sense to ask them to do more.   Allergy > for now high dose ics then step down once saba use reduced and add singulair trial next if not better   ? Active sinus dz/ rhinitis on exam > zyrtec d prn, consider trial of Nasal steroids/ singulair next ov    ? Anxiety/depression > usually at the bottom of this list of usual suspects but should be included in this pt's based on H and P and note already on psychotropics and may interfere with adherence and also interpretation of response or lack thereof to symptom management which can be quite subjective.   ? Acid (or non-acid) GERD > always difficult to exclude as up to 75% of pts in some series report no assoc GI/ Heartburn symptoms> rec  Consider max (24h)  acid suppression and diet restrictions next ov if noct and p supper symptoms don't resolve      Total time devoted to counseling  > 50 % of initial 60 min office visit:  review case with pt/ discussion of  options/alternatives/device teaching which extended face to face time for this visit -  personally creating written customized instructions  in presence of pt  then going over those specific  Instructions directly with the pt including how to use all of the meds but in particular covering each new medication in detail and the difference between the maintenance= "automatic" meds and the prns using an action plan format for the latter (If this problem/symptom => do that organization reading Left to right).  Please see AVS from this visit for a full list of these instructions which I personally wrote for this pt and  are unique to this visit.       Christinia Gully, MD 07/20/2018

## 2018-07-21 ENCOUNTER — Encounter: Payer: Self-pay | Admitting: Internal Medicine

## 2018-07-21 DIAGNOSIS — J4530 Mild persistent asthma, uncomplicated: Secondary | ICD-10-CM | POA: Insufficient documentation

## 2018-07-21 NOTE — Assessment & Plan Note (Signed)
Spirometry 07/20/2018  FEV1 2.4 (81%)  Ratio 70 min curvature off saba on day of study   - 07/20/2018   Walked RA  2 laps @ 232ft each @ fast pace  stopped due to  Endo of study, min sob    This is likely a conditioning issue - see asthma

## 2018-07-21 NOTE — Assessment & Plan Note (Signed)
FENO 07/20/2018  =   75 Spirometry 07/20/2018  FEV1 2.4 (81%)  Ratio 70 min curvature off saba on day of study   - 07/20/2018  After extensive coaching inhaler device,  effectiveness =    75% > try symbiocort 160 2bid   Way over using saba daily x years so clearly goals of asthma not being me with high feno suggesting eos asthma despite neg studies from [employer] per pt report   DDX of  difficult airways management almost all start with A and  include Adherence, Ace Inhibitors, Acid Reflux, Active Sinus Disease, Alpha 1 Antitripsin deficiency, Anxiety masquerading as Airways dz,  ABPA,  Allergy(esp in young), Aspiration (esp in elderly), Adverse effects of meds,  Active smoking or vaping, A bunch of PE's (a small clot burden can't cause this syndrome unless there is already severe underlying pulm or vascular dz with poor reserve) plus two Bs  = Bronchiectasis and Beta blocker use..and one C= CHF   Adherence is always the initial "prime suspect" and is a multilayered concern that requires a "trust but verify" approach in every patient - starting with knowing how to use medications, especially inhalers, correctly, keeping up with refills and understanding the fundamental difference between maintenance and prns vs those medications only taken for a very short course and then stopped and not refilled.  - see hfa teaching - return with all meds in hand using a trust but verify approach to confirm accurate Medication  Reconciliation The principal here is that until we are certain that the  patients are doing what we've asked, it makes no sense to ask them to do more.   Allergy > for now high dose ics then step down once saba use reduced and add singulair trial next if not better   ? Active sinus dz/ rhinitis on exam > zyrtec d prn, consider trial of Nasal steroids/ singulair next ov    ? Anxiety/depression > usually at the bottom of this list of usual suspects but should be included in this pt's based on  H and P and note already on psychotropics and may interfere with adherence and also interpretation of response or lack thereof to symptom management which can be quite subjective.   ? Acid (or non-acid) GERD > always difficult to exclude as up to 75% of pts in some series report no assoc GI/ Heartburn symptoms> rec  Consider max (24h)  acid suppression and diet restrictions next ov if noct and p supper symptoms don't resolve      Total time devoted to counseling  > 50 % of initial 60 min office visit:  review case with pt/ discussion of options/alternatives/device teaching which extended face to face time for this visit -  personally creating written customized instructions  in presence of pt  then going over those specific  Instructions directly with the pt including how to use all of the meds but in particular covering each new medication in detail and the difference between the maintenance= "automatic" meds and the prns using an action plan format for the latter (If this problem/symptom => do that organization reading Left to right).  Please see AVS from this visit for a full list of these instructions which I personally wrote for this pt and  are unique to this visit.

## 2018-08-23 ENCOUNTER — Ambulatory Visit
Admission: RE | Admit: 2018-08-23 | Discharge: 2018-08-23 | Disposition: A | Discharge status: Home / Self Care | Payer: BC Managed Care – PPO | Source: Ambulatory Visit | Attending: Obstetrics & Gynecology | Admitting: Obstetrics & Gynecology

## 2018-08-23 DIAGNOSIS — Z1231 Encounter for screening mammogram for malignant neoplasm of breast: Secondary | ICD-10-CM

## 2018-08-25 ENCOUNTER — Encounter: Payer: Self-pay | Admitting: Internal Medicine

## 2018-08-25 ENCOUNTER — Ambulatory Visit (INDEPENDENT_AMBULATORY_CARE_PROVIDER_SITE_OTHER): Payer: BC Managed Care – PPO | Admitting: Internal Medicine

## 2018-08-25 VITALS — BP 106/64 | HR 83 | Ht 67.5 in | Wt 170.6 lb

## 2018-08-25 DIAGNOSIS — R0609 Other forms of dyspnea: Secondary | ICD-10-CM | POA: Diagnosis not present

## 2018-08-25 DIAGNOSIS — J4530 Mild persistent asthma, uncomplicated: Secondary | ICD-10-CM

## 2018-08-25 LAB — NITRIC OXIDE: Nitric Oxide: 35

## 2018-08-25 MED ORDER — BUDESONIDE-FORMOTEROL FUMARATE 160-4.5 MCG/ACT IN AERO: 2.0000 | INHALATION_SPRAY | Freq: Two times a day (BID) | RESPIRATORY_TRACT | 0 refills | Status: DC

## 2018-08-25 NOTE — Patient Instructions (Signed)
Continue symbicort 160 Take 2 puffs first thing in am and then another 2 puffs about 12 hours later.     Only use your albuterol as a rescue medication to be used if you can't catch your breath by resting or doing a relaxed purse lip breathing pattern.  - The less you use it, the better it will work when you need it. - Ok to use up to 2 puffs  every 4 hours if you must but call for immediate appointment if use goes up over your usual need - Don't leave home without it !!  (think of it like the spare tire for your car)   To get the most out of exercise, you need to be continuously aware that you are short of breath, but never out of breath, for 30 minutes daily. As you improve, it will actually be easier for you to do the same amount of exercise  in  30 minutes so always push to the level where you are short of breath.    Please schedule a follow up office visit in 6 weeks, call sooner if needed

## 2018-08-25 NOTE — Progress Notes (Signed)
Nicole Fleming, female    DOB: [birth year],    MRN: [id number]     Brief patient profile:  46 yowf never smoker with h/o allergies as child = itching/sneezing not wheezing took shots as a teenager  in [employer] [facility] and shots helped and everything was fine until around 2009 started having  breathing attacks in winter time >  to [place] multiple times and rx with prn saba then eval by Nicole Fleming > dermatographia so ran RAST neg > never went back and reports since then using more and more albuterol and freq prednisone. Was prescribed advair but apparently never took it and referred to pulmonary clinic 07/20/2018 by Dr   Nicole Fleming.     History of Present Illness  07/20/2018  Pulmonary/ 1st office eval/Nicole Fleming  Chief Complaint  Patient presents with  . Pulmonary Consult    Referred by Dr. Edwinna Fleming. Pt c/o SOB x 6 months.  She states SOB can occue with or without exertion.  She is using her albuterol inhaler 2-4 x per day.   Dyspnea:  MMRC1 = can walk nl pace, flat grade, can't hurry or go uphills or steps s sob  Episodes of sob at rest resolve w/in 5 min p saba varies up to 4 x daily max assoc with chest tightness than never comes on with ex  Walks dog up to [place] loop some hills/ all conditions   Cough: dry cough x 10 years - more day than noct - wakes her up  Maybe once a week Sleep: ok to lie flat sound sleep / 4/7 nights at bedtime feels need for hs saba also  SABA use: freq needs  p supper o/w no pattern to daytime use  rec Plan A = Automatic = Symbicort 160 Take 2 puffs first thing in am and then another 2 puffs about 12 hours later.  Work on inhaler technique:  Plan B = Backup Only use your albuterol inhaler        08/25/2018  f/u ov/Nicole Fleming re: asthma vs panic disorder ? better on symb 160 2bid but never filled rx "the sample has not run out " (15 day given 07/20/18)  Chief Complaint  Patient presents with  . Follow-up    pt states she is doing well, does note some dyspnea both at  rest and exertion.  Denies wheezing, cough, mucus production.   Dyspnea:  Walking dog daily up some hills / walks all over school s problem with sob but no aerobics   Cough: none Sleeping: no longer using albuterol at bedtime, still occ spell wakes her but much more likely at rest sitting - see below  SABA use: none at all since last ov  02: no  Spells of sob at rest assoc with panic sensation lasting up to 10 min and resolves s rx  - this too though goes away p depomedrol    No obvious day to day or daytime variability or assoc excess/ purulent sputum or mucus plugs or hemoptysis or cp or chest tightness, subjective wheeze or overt sinus or hb symptoms.   Sleeping better  Without classic nocturnal  wheeze or early am exacerbation  of respiratory  c/o's or need for noct saba. Also denies any obvious fluctuation of symptoms with weather or environmental changes or other aggravating or alleviating factors except as outlined above   No unusual exposure hx or h/o childhood pna/ asthma or knowledge of premature birth.  Current Allergies, Complete Past Medical History,  Past Surgical History, Family History, and Social History were reviewed in [facility] record.  ROS  The following are not active complaints unless bolded Hoarseness, sore throat, dysphagia, dental problems, itching, sneezing,  nasal congestion or discharge of excess mucus or purulent secretions, ear ache,   fever, chills, sweats, unintended wt loss or wt gain, classically pleuritic or exertional cp,  orthopnea pnd or arm/hand swelling  or leg swelling, presyncope, palpitations, abdominal pain, anorexia, nausea, vomiting, diarrhea  or change in bowel habits or change in bladder habits, change in stools or change in urine, dysuria, hematuria,  rash, arthralgias, visual complaints, headache, numbness, weakness or ataxia or problems with walking or coordination,  change in mood= anxiety/ fear of clowns or  memory.         Current Meds  Medication Sig  . albuterol (PROVENTIL HFA;VENTOLIN HFA) 108 (90 BASE) MCG/ACT inhaler Inhale 2 puffs into the lungs every 6 (six) hours as needed for wheezing.  . budesonide-formoterol (SYMBICORT) 160-4.5 MCG/ACT inhaler Inhale 2 puffs into the lungs 2 (two) times daily.  .     .     .              Objective:     Wt Readings from Last 3 Encounters:  08/25/18 170 lb 9.6 oz (77.4 kg)  07/20/18 171 lb 9.6 oz (77.8 kg)  07/13/18 171 lb (77.6 kg)     Vital signs reviewed - Note on arrival 02 sats  99% on RA       amb wf nad   HEENT: nl dentition, turbinates bilaterally, and oropharynx. Nl external ear canals without cough reflex   NECK :  without JVD/Nodes/TM/ nl carotid upstrokes bilaterally   LUNGS: no acc muscle use,  Nl contour chest which is clear to A and P bilaterally without cough on insp or exp maneuvers   CV:  RRR  no s3 or murmur or increase in P2, and no edema   ABD:  soft and nontender with nl inspiratory excursion in the supine position. No bruits or organomegaly appreciated, bowel sounds nl  MS:  Nl gait/ ext warm without deformities, calf tenderness, cyanosis or clubbing No obvious joint restrictions   SKIN: warm and dry without lesions    NEURO:  alert, approp, nl sensorium with  no motor or cerebellar deficits apparent.                   Assessment

## 2018-08-26 ENCOUNTER — Encounter: Payer: Self-pay | Admitting: Internal Medicine

## 2018-08-26 NOTE — Assessment & Plan Note (Signed)
Onset of atypical symptoms 2009 FENO 07/20/2018  =   75 Spirometry 07/20/2018  FEV1 2.4 (81%)  Ratio 70 min curvature off saba on day of study   - 07/20/2018    try symbiocort 160 2bid  - FENO 08/25/2018  =   35 not taking symb 160 consistently  - 08/25/2018  After extensive coaching inhaler device,  effectiveness =    90% so rec resume symbicort 160 2bid   Emphasized if this is asthma it's "like a rash on the airways" she can't see but can feel and is reflected in high feno which is better despite non-adherence and might resolve completely if she's more consistent with applying the topical rx and avoiding steroids which she says " only thing that works"

## 2018-08-26 NOTE — Assessment & Plan Note (Signed)
Spirometry 07/20/2018  FEV1 2.4 (81%)  Ratio 70 min curvature off saba on day of study   - 07/20/2018   Walked RA  2 laps @ 281ft each @ fast pace  stopped due to  Endo of study, min sob   >>>Ex symptoms have resolved though she is not doing any aerobics which were encouraged  >>>Resting symptoms that resolve spont are c/w panic disorder and not she has taken herself off of celexa > referred back to PCP     I had an extended discussion with the patient reviewing all relevant studies completed to date and  lasting 15 to 20 minutes of a 25 minute visit    See device teaching which extended face to face time for this visit.  Each maintenance medication was reviewed in detail including emphasizing most importantly the difference between maintenance and prns and under what circumstances the prns are to be triggered using an action plan format that is not reflected in the computer generated alphabetically organized AVS which I have not found useful in most complex patients, especially with respiratory illnesses  Please see AVS for specific instructions unique to this visit that I personally wrote and verbalized to the the pt in detail and then reviewed with pt  by my nurse highlighting any  changes in therapy recommended at today's visit to their plan of care.

## 2018-09-02 ENCOUNTER — Ambulatory Visit: Payer: BC Managed Care – PPO | Admitting: Obstetrics & Gynecology

## 2018-10-06 ENCOUNTER — Ambulatory Visit: Payer: BC Managed Care – PPO | Admitting: Internal Medicine

## 2018-10-19 ENCOUNTER — Other Ambulatory Visit (INDEPENDENT_AMBULATORY_CARE_PROVIDER_SITE_OTHER): Payer: BC Managed Care – PPO

## 2018-10-19 DIAGNOSIS — E785 Hyperlipidemia, unspecified: Secondary | ICD-10-CM

## 2018-10-19 DIAGNOSIS — E559 Vitamin D deficiency, unspecified: Secondary | ICD-10-CM

## 2018-10-20 LAB — LIPID PANEL
Chol/HDL Ratio: 5.3 ratio — ABNORMAL HIGH (ref 0.0–4.4)
Cholesterol, Total: 186 mg/dL (ref 100–199)
HDL: 35 mg/dL — ABNORMAL LOW (ref >39)
LDL Calculated: 121 mg/dL — ABNORMAL HIGH (ref 0–99)
Triglycerides: 152 mg/dL — ABNORMAL HIGH (ref 0–149)
VLDL Cholesterol Cal: 30 mg/dL (ref 5–40)

## 2018-10-20 LAB — VITAMIN D 25 HYDROXY (VIT D DEFICIENCY, FRACTURES): VIT D 25 HYDROXY: 26.2 ng/mL — AB (ref 30.0–100.0)

## 2019-07-07 ENCOUNTER — Telehealth: Payer: Self-pay | Admitting: Internal Medicine

## 2019-07-07 MED ORDER — BUDESONIDE-FORMOTEROL FUMARATE 160-4.5 MCG/ACT IN AERO: 2.0000 | INHALATION_SPRAY | Freq: Two times a day (BID) | RESPIRATORY_TRACT | 5 refills | Status: DC

## 2019-07-07 MED ORDER — BUDESONIDE-FORMOTEROL FUMARATE 160-4.5 MCG/ACT IN AERO: 2.0000 | INHALATION_SPRAY | Freq: Two times a day (BID) | RESPIRATORY_TRACT | 0 refills | Status: DC

## 2019-07-07 NOTE — Telephone Encounter (Signed)
Called and spoke with pt letting her know the info stated by MW that he said we could fill her symb inhaler x1. Stated to her the info per MW in regards to mask limitations.  When stated that to pt, pt said that she was in a bible study group with total 16 people. Pt said after about 36min, pt began to have trouble breathing but did not in fact have an asthma attack. Pt said the person who was over bible study told her that she had to wear the mask the entire time. Pt said she was unsure if she would still be able to do bible study if having to wear mask the entire time for 1 hour.  I stated to pt that we could schedule televisit with MW tomorrow 11/20 so she could further discuss this with MW and she verbalized understanding. televisit scheduled for televisit with MW tomorrow. Nothing further needed.

## 2019-07-07 NOTE — Telephone Encounter (Signed)
Ok x one refill if running out  but can't do anything further until at least a televisit including any comment about her req for mask limitations (unless she's having bad asthma attacks should be able to tol fine)

## 2019-07-07 NOTE — Telephone Encounter (Signed)
Dr. Melvyn Novas, please take a look at the question below from the patient regarding her letter request. She was last seen by you 08/25/18. However, the prescription sent at that time was for only one Symbicort and no refill.   The patient did not come into clinic 6 weeks later per your office note. And does not have any appointments scheduled to see you in January.

## 2019-07-08 ENCOUNTER — Encounter: Payer: Self-pay | Admitting: Internal Medicine

## 2019-07-08 ENCOUNTER — Other Ambulatory Visit: Payer: Self-pay

## 2019-07-08 ENCOUNTER — Ambulatory Visit (INDEPENDENT_AMBULATORY_CARE_PROVIDER_SITE_OTHER): Payer: BC Managed Care – PPO | Admitting: Internal Medicine

## 2019-07-08 DIAGNOSIS — J4530 Mild persistent asthma, uncomplicated: Secondary | ICD-10-CM

## 2019-07-08 MED ORDER — ALBUTEROL SULFATE HFA 108 (90 BASE) MCG/ACT IN AERS: INHALATION_SPRAY | RESPIRATORY_TRACT | 1 refills | Status: DC

## 2019-07-08 MED ORDER — PREDNISONE 10 MG PO TABS: ORAL_TABLET | ORAL | 0 refills | Status: DC

## 2019-07-08 MED ORDER — BUDESONIDE-FORMOTEROL FUMARATE 160-4.5 MCG/ACT IN AERO: INHALATION_SPRAY | RESPIRATORY_TRACT | 12 refills | Status: DC

## 2019-07-08 NOTE — Progress Notes (Signed)
Nicole Fleming, female    DOB: 30-Jun-1961,    MRN: [id number]     Brief patient profile:  40 yowf never smoker with h/o allergies as child = itching/sneezing not wheezing took shots as a teenager  in [facility] and shots helped and everything was fine until around 2009 started having  breathing attacks in winter time >  to [facility] multiple times and rx with prn saba then eval by [facility] > dermatographia so ran RAST neg > never went back and reports since then using more and more albuterol and freq prednisone. Was prescribed advair but apparently never took it and referred to pulmonary clinic 07/20/2018 by Dr   Edwinna Areola.     History of Present Illness  07/20/2018  Pulmonary/ 1st office eval/Wert  Chief Complaint  Patient presents with  . Pulmonary Consult    Referred by Dr. Edwinna Areola. Pt c/o SOB x 6 months.  She states SOB can occue with or without exertion.  She is using her albuterol inhaler 2-4 x per day.   Dyspnea:  MMRC1 = can walk nl pace, flat grade, can't hurry or go uphills or steps s sob  Episodes of sob at rest resolve w/in 5 min p saba varies up to 4 x daily max assoc with chest tightness than never comes on with ex  Walks dog up to [place] some hills/ all conditions   Cough: dry cough x 10 years - more day than noct - wakes her up  Maybe once a week Sleep: ok to lie flat sound sleep / 4/7 nights at bedtime feels need for hs saba also  SABA use: freq needs  p supper o/w no pattern to daytime use  rec Plan A = Automatic = Symbicort 160 Take 2 puffs first thing in am and then another 2 puffs about 12 hours later.  Work on inhaler technique:  Plan B = Backup Only use your albuterol inhaler        08/25/2018  f/u ov/Wert re: asthma vs panic disorder ? better on symb 160 2bid but never filled rx "the sample has not run out " (15 day given 07/20/18)  Chief Complaint  Patient presents with  . Follow-up    pt states she is doing well, does note some dyspnea both at  rest and exertion.  Denies wheezing, cough, mucus production.   Dyspnea:  Walking dog daily up some hills / walks all over school s problem with sob but no aerobics   Cough: none Sleeping: no longer using albuterol at bedtime, still occ spell wakes her but much more likely at rest sitting - see below  SABA use: none at all since last ov  02: no  Spells of sob at rest assoc with panic sensation lasting up to 10 min and resolves s rx  - this too though goes away p depomedrol  rec Continue symbicort 160 Take 2 puffs first thing in am and then another 2 puffs about 12 hours later.  Only use your albuterol as a rescue medication Please schedule a follow up office visit in 6 weeks, call sooner if needed    Virtual Visit via Telephone Note 07/08/2019   I connected with Nicole Fleming on 07/08/19 at 8:30 AM EST by telephone and verified that I am speaking with the correct person using two identifiers.   I discussed the limitations, risks, security and privacy concerns of performing an evaluation and management service by telephone and  the availability of in person appointments. I also discussed with the patient that there may be a patient responsible charge related to this service. The patient expressed understanding and agreed to proceed.   History of Present Illness: asthma Worse  Sob/subj wheezer acutel onset x  2 weeks assoc with nasal congestion had been able to keep to keep in check on twice weekly symnbicort x months prior and has increased symb 160 to 2 bid with some improvement but still using saba bid   Dyspnea:  Sob with exertion, ok at rest  Cough: assoc with pnd but no purulent sputum  SABA use: bid - did not know could use up to 2 q 4 but call if use goes up  02: none    No obvious day to day or daytime variability or assoc excess/ purulent sputum or mucus plugs or hemoptysis or cp or chest tightness,   or overt  hb symptoms.    Also denies any obvious fluctuation of symptoms with  weather or environmental changes or other aggravating or alleviating factors except as outlined above.   Meds reviewed/ med reconciliation completed       Observations/Objective: Sounds good on phone  although nasal tone to voice, able to speak full sentences    Assessment and Plan: See problem list for active a/p's   Follow Up Instructions: See avs for instructions unique to this ov which includes revised/ updated med list     I discussed the assessment and treatment plan with the patient. The patient was provided an opportunity to ask questions and all were answered. The patient agreed with the plan and demonstrated an understanding of the instructions.   The patient was advised to call back or seek an in-person evaluation if the symptoms worsen or if the condition fails to improve as anticipated.  I provided 25 minutes of non-face-to-face time during this encounter.   Christinia Gully, MD

## 2019-07-08 NOTE — Assessment & Plan Note (Signed)
Onset of atypical symptoms 2009 FENO 07/20/2018  =   75 Spirometry 07/20/2018  FEV1 2.4 (81%)  Ratio 70 min curvature off saba on day of study   - 07/20/2018    try symbiocort 160 2bid  - FENO 08/25/2018  =   35 not taking symb 160 consistently  - 08/25/2018  After extensive coaching inhaler device,  effectiveness =    90% so rec resume symbicort 160 2bid   Nov 2020 flare off symbicort bid (taking prn) in setting of rhinitis s purulent secretions so  rec symb 160 2bid and Prednisone 10 mg take  4 each am x 2 days,   2 each am x 2 days,  1 each am x 2 days and stop and f/u in 4 weeks / zyrtec prn.  Reviewed rule of 2s:  If your breathing worsens or you need to use your rescue inhaler more than twice weekly or wake up more than twice a month with any respiratory symptoms or require more than two rescue inhalers per year, we need to see you right away because this means we're not controlling the underlying problem (inflammation) adequately.  Rescue inhalers (albuterol) do not control inflammation and overuse can lead to unnecessary and costly consequences.  They can make you feel better temporarily but eventually they will quit working effectively much as sleep aids lead to more insomnia if used regularly.    F/u in 4 weeks and then consider taper to prn use vs maint high dose    Each maintenance medication was reviewed in detail including most importantly the difference between maintenance and as needed and under what circumstances the prns are to be used.  Please see AVS for specific  Instructions which are unique to this visit and I personally typed out  which were reviewed in detail over the phone with the patient and a copy provided via MyChart

## 2019-07-08 NOTE — Patient Instructions (Addendum)
Prednisone 10 mg take  4 each am x 2 days,   2 each am x 2 days,  1 each am x 2 days and stop   Continue symbicort 160 Take 2 puffs first thing in am and then another 2 puffs about 12 hours later.    Only use your albuterol as a rescue medication to be used if you can't catch your breath by resting or doing a relaxed purse lip breathing pattern.  - The less you use it, the better it will work when you need it. - Ok to use up to 2 puffs  every 4 hours if you must but call for immediate appointment if use goes up over your usual need - Don't leave home without it !!  (think of it like the spare tire for your car)     Please schedule a follow up office visit in 4 weeks, call sooner if needed

## 2019-07-09 ENCOUNTER — Encounter: Payer: Self-pay | Admitting: Internal Medicine

## 2019-07-11 ENCOUNTER — Telehealth: Payer: Self-pay | Admitting: Obstetrics & Gynecology

## 2019-07-11 NOTE — Telephone Encounter (Signed)
Patient canceled her upcoming aex 07/18/19 due to Dr.Wert's recommendation that she no visit any doctors offices at this time. She did reschedule to 11/14/19. She did not request a return call just wanted to make Dr.Miller aware.

## 2019-07-12 ENCOUNTER — Telehealth: Payer: Self-pay | Admitting: Internal Medicine

## 2019-07-12 NOTE — Telephone Encounter (Signed)
Called the patient and advised of response received from Dr. Melvyn Novas. She voiced understanding. Nothing further needed at this time.

## 2019-07-12 NOTE — Telephone Encounter (Signed)
Ok to leave off prednisone - this is probably dose related and in the future can probably just use 2 x 2 days, 1 x 2 days and off but for now no more needed

## 2019-07-12 NOTE — Telephone Encounter (Signed)
Called spoke with patient who reported she started the pred taper given at the 11/20 visit with Dr Melvyn Novas just yesterday.  Patient began feeling jittery and like her HR was elevated.  Patient stated that she went to give plasma yesterday and was unable to donate because her HR was 118 and would not decrease with rest.  Patient did not take her Symbicort yesterday evening because she still felt jittery and did not take it again this morning "just to be sure."  Patient stated that she 'feels fine today' and only called because she feels like this should be documented.  Patient denies any of the symptoms that the prednisone was rx'd for.  Patient stated that if Dr Melvyn Novas has nothing to add/recommend, then a return call is not needed.  Dr Melvyn Novas please advise, thank you.

## 2019-07-18 ENCOUNTER — Ambulatory Visit: Payer: BC Managed Care – PPO | Admitting: Obstetrics & Gynecology

## 2019-07-19 ENCOUNTER — Telehealth: Payer: Self-pay | Admitting: Internal Medicine

## 2019-07-19 NOTE — Telephone Encounter (Signed)
Medication name and strength: Symbicort 160 Provider: MW Pharmacy: [facility] in [place] on [address]. Patient insurance ID: [id number] Phone: [phone] Fax: [phone]  Was the PA started on CMM?  Yes If yes, please enter the Key: [facility] Timeframe for approval/denial: 1-5 business days

## 2019-07-21 NOTE — Telephone Encounter (Signed)
Received a response from insurance and CMM that a PA was not needed. Pharmacy is aware. Will close this encounter.

## 2019-10-22 ENCOUNTER — Ambulatory Visit: Payer: BC Managed Care – PPO | Attending: Internal Medicine

## 2019-10-22 DIAGNOSIS — Z23 Encounter for immunization: Secondary | ICD-10-CM | POA: Insufficient documentation

## 2019-10-22 NOTE — Progress Notes (Signed)
   Covid-19 Vaccination Clinic  Name:  Nicole Fleming    MRN: [id number] DOB: [birth year]  10/22/2019  [name] was observed post Covid-19 immunization for 15 minutes without incident. She was provided with Vaccine Information Sheet and instruction to access the V-Safe system.   [name] was instructed to call 911 with any severe reactions post vaccine: Marland Kitchen Difficulty breathing  . Swelling of face and throat  . A fast heartbeat  . A bad rash all over body  . Dizziness and weakness   Immunizations Administered    Name Date Dose VIS Date Route   Pfizer COVID-19 Vaccine 10/22/2019 10:32 AM 0.3 mL 07/29/2019 Intramuscular   Manufacturer: [place]   Lot: [id number]   [place]: [id number]

## 2019-11-04 ENCOUNTER — Other Ambulatory Visit: Payer: Self-pay | Admitting: Obstetrics & Gynecology

## 2019-11-04 DIAGNOSIS — Z1231 Encounter for screening mammogram for malignant neoplasm of breast: Secondary | ICD-10-CM

## 2019-11-12 ENCOUNTER — Ambulatory Visit: Payer: BC Managed Care – PPO | Attending: Internal Medicine

## 2019-11-12 DIAGNOSIS — Z23 Encounter for immunization: Secondary | ICD-10-CM

## 2019-11-12 NOTE — Progress Notes (Signed)
   Covid-19 Vaccination Clinic  Name:  Nicole Fleming    MRN: [id number] DOB: Sep 27, 1960  11/12/2019  [name] was observed post Covid-19 immunization for 15 minutes without incident. She was provided with Vaccine Information Sheet and instruction to access the V-Safe system.   [name] was instructed to call 911 with any severe reactions post vaccine: Marland Kitchen Difficulty breathing  . Swelling of face and throat  . A fast heartbeat  . A bad rash all over body  . Dizziness and weakness   Immunizations Administered    Name Date Dose VIS Date Route   Pfizer COVID-19 Vaccine 11/12/2019 10:00 AM 0.3 mL 07/29/2019 Intramuscular   Manufacturer: [place]   Lot: [id number]   [place]: [id number]

## 2019-11-14 ENCOUNTER — Ambulatory Visit: Payer: BC Managed Care – PPO | Admitting: Obstetrics & Gynecology

## 2019-11-28 ENCOUNTER — Other Ambulatory Visit (HOSPITAL_COMMUNITY)
Admission: RE | Admit: 2019-11-28 | Discharge: 2019-11-28 | Disposition: A | Discharge status: Home / Self Care | Payer: BC Managed Care – PPO | Source: Ambulatory Visit | Attending: Obstetrics & Gynecology | Admitting: Obstetrics & Gynecology

## 2019-11-28 ENCOUNTER — Other Ambulatory Visit: Payer: Self-pay

## 2019-11-28 ENCOUNTER — Encounter: Payer: Self-pay | Admitting: Obstetrics & Gynecology

## 2019-11-28 ENCOUNTER — Ambulatory Visit: Payer: BC Managed Care – PPO | Admitting: Obstetrics & Gynecology

## 2019-11-28 VITALS — BP 120/68 | HR 72 | Temp 97.0°F | Resp 16 | Ht 68.75 in | Wt 177.0 lb

## 2019-11-28 DIAGNOSIS — Z0000 Encounter for general adult medical examination without abnormal findings: Secondary | ICD-10-CM

## 2019-11-28 DIAGNOSIS — Z124 Encounter for screening for malignant neoplasm of cervix: Secondary | ICD-10-CM | POA: Diagnosis present

## 2019-11-28 DIAGNOSIS — Z01419 Encounter for gynecological examination (general) (routine) without abnormal findings: Secondary | ICD-10-CM

## 2019-11-28 DIAGNOSIS — B977 Papillomavirus as the cause of diseases classified elsewhere: Secondary | ICD-10-CM

## 2019-11-28 NOTE — Progress Notes (Signed)
59 y.o. G2P2 Single White or Caucasian female here for annual exam.  Doing well.  Denies vaginal bleeding.    Has received the Covid vaccine.  After the second vaccines, she's had some episodic "spells" of feeling hot and having some sweating.  These are just like hot flashes but have only started since having the second Covid vaccination.  Received [employer].    Patient's last menstrual period was 03/05/2012.          Sexually active: No.  The current method of family planning is tubal ligation.    Exercising: Yes.    walking 2 miles daily Smoker:  no  Health Maintenance: Pap:  06-12-17 neg HPV HR+ 16,18/45 neg, 07-13-18 neg HPV HR neg History of abnormal Pap:  Yes LEEP MMG:  08-24-2018 category b density birads 1:neg, scheduled for 12/2019 Colonoscopy:  2009 f/u 89yrs, not done.  Declines this year.   BMD:   2009  TDaP:  unsure Pneumonia vaccine(s):  Not done Shingrix:   Not done Hep C testing: donated blood in the past Screening Labs: obtained today   reports that she has never smoked. She has never used smokeless tobacco. She reports that she does not drink alcohol or use drugs.  Past Medical History:  Diagnosis Date  . Abnormal pap 2000   h/o LEEP  . Asthma   . Contact lens/glasses fitting    wears contacts or glasses  . Depression    and anxiety  . Hemorrhoids, external   . IBS (irritable bowel syndrome)    questionable gluten allergy  . Mitral valve prolapse 2006   echo 2013-no MVP-normal valve-no regurg  . Schatzki's ring 1/07   of esophagus    Past Surgical History:  Procedure Laterality Date  . CESAREAN SECTION  1990  . CESAREAN SECTION  1992  . CHOLECYSTECTOMY  12/2005  . ESOPHAGEAL DILATION  1/07  . HEMORRHOID SURGERY N/A 03/01/2013   Procedure: HEMORRHOIDECTOMY;  Surgeon: Imogene Burn. Georgette Dover, MD;  Location: [facility];  Service: General;  Laterality: N/A;  . LEEP  1999  . lipoma removal  10/29/2011   abdominal wall  . SKIN BIOPSY Left 03/01/2013   Procedure: SKIN BIOPSY  X 2  ABDOMINAL WALL;  Surgeon: Imogene Burn. Georgette Dover, MD;  Location: [facility];  Service: General;  Laterality: Left;  . TUBAL LIGATION      Current Outpatient Medications  Medication Sig Dispense Refill  . albuterol (PROAIR HFA) 108 (90 Base) MCG/ACT inhaler 2 puffs every 4 hours as needed for short of breath, wheezing or chest tightness 18 g 1  . budesonide-formoterol (SYMBICORT) 160-4.5 MCG/ACT inhaler Inhale 2 puffs into the lungs 2 (two) times daily. 1 Inhaler 0  . Vitamin D, Ergocalciferol, (DRISDOL) 1.25 MG (50000 UT) CAPS capsule Take 1 capsule (50,000 Units total) by mouth every 7 (seven) days. 12 capsule 0   No current facility-administered medications for this visit.    Family History  Problem Relation Age of Onset  . Heart disease Father 29       cardiac arrest/mitral valve replaced    Review of Systems  Constitutional: Negative.   HENT: Negative.   Eyes: Negative.   Respiratory: Negative.   Cardiovascular: Negative.   Gastrointestinal: Negative.   Endocrine: Negative.   Genitourinary: Negative.   Musculoskeletal: Negative.   Skin: Negative.   Allergic/Immunologic: Negative.   Neurological: Negative.   Psychiatric/Behavioral: Negative.     Exam:   BP 120/68   Pulse 72  Temp (!) 97 F (36.1 C) (Skin)   Resp 16   Ht 5' 8.75" (1.746 m)   Wt 177 lb (80.3 kg)   LMP 03/05/2012   BMI 26.33 kg/m    Height: 5' 8.75" (174.6 cm)  Ht Readings from Last 3 Encounters:  11/28/19 5' 8.75" (1.746 m)  08/25/18 5' 7.5" (1.715 m)  07/20/18 5' 7.5" (1.715 m)    General appearance: alert, cooperative and appears stated age Head: Normocephalic, without obvious abnormality, atraumatic Neck: no adenopathy, supple, symmetrical, trachea midline and thyroid normal to inspection and palpation Lungs: clear to auscultation bilaterally Breasts: normal appearance, no masses or tenderness Heart: regular rate and rhythm Abdomen: soft, non-tender;  bowel sounds normal; no masses,  no organomegaly Extremities: extremities normal, atraumatic, no cyanosis or edema Skin: Skin color, texture, turgor normal. No rashes or lesions Lymph nodes: Cervical, supraclavicular, and axillary nodes normal. No abnormal inguinal nodes palpated Neurologic: Grossly normal   Pelvic: External genitalia:  no lesions              Urethra:  normal appearing urethra with no masses, tenderness or lesions              Bartholins and Skenes: normal                 Vagina: normal appearing vagina with normal color and discharge, no lesions              Cervix: no lesions              Pap taken: Yes.   Bimanual Exam:  Uterus:  normal size, contour, position, consistency, mobility, non-tender              Adnexa: normal adnexa and no mass, fullness, tenderness               Rectovaginal: Confirms               Anus:  normal sphincter tone, no lesions  Chaperone, Marisa Sprinkles, CMA, was present for exam.  A:  Well Woman with normal exam PMP, no HRT H/o LEEP 1999 +HR HPV in 2018 Possible side effect from Covid vaccination  P:   Mammogram guidelines reviewed.  Has this scheduled. pap smear with HR HPV obtained CBC, CMP, TSH and VitD, HbA1C obtained today Cologuard will be ordered for pt BMD obtained toda Return annually or prn

## 2019-11-28 NOTE — Patient Instructions (Signed)
[facility]) [address] [place],  [place]  [address]  Main: [phone]

## 2019-11-29 ENCOUNTER — Ambulatory Visit: Payer: BC Managed Care – PPO | Admitting: Obstetrics & Gynecology

## 2019-11-29 LAB — CYTOLOGY - PAP
Comment: NEGATIVE
Diagnosis: NEGATIVE
High risk HPV: POSITIVE — AB

## 2019-11-29 LAB — COMPREHENSIVE METABOLIC PANEL
ALT: 15 IU/L (ref 0–32)
AST: 15 IU/L (ref 0–40)
Albumin/Globulin Ratio: 1.8 (ref 1.2–2.2)
Albumin: 4.2 g/dL (ref 3.8–4.9)
Alkaline Phosphatase: 91 IU/L (ref 39–117)
BUN/Creatinine Ratio: 20 (ref 9–23)
BUN: 16 mg/dL (ref 6–24)
Bilirubin Total: 0.2 mg/dL (ref 0.0–1.2)
CO2: 21 mmol/L (ref 20–29)
Calcium: 9.4 mg/dL (ref 8.7–10.2)
Chloride: 106 mmol/L (ref 96–106)
Creatinine, Ser: 0.81 mg/dL (ref 0.57–1.00)
GFR calc Af Amer: 93 mL/min/{1.73_m2} (ref >59)
GFR calc non Af Amer: 80 mL/min/{1.73_m2} (ref >59)
Globulin, Total: 2.4 g/dL (ref 1.5–4.5)
Glucose: 89 mg/dL (ref 65–99)
Potassium: 4.2 mmol/L (ref 3.5–5.2)
Sodium: 143 mmol/L (ref 134–144)
Total Protein: 6.6 g/dL (ref 6.0–8.5)

## 2019-11-29 LAB — LIPID PANEL
Chol/HDL Ratio: 4.6 ratio — ABNORMAL HIGH (ref 0.0–4.4)
Cholesterol, Total: 197 mg/dL (ref 100–199)
HDL: 43 mg/dL (ref >39)
LDL Chol Calc (NIH): 130 mg/dL — ABNORMAL HIGH (ref 0–99)
Triglycerides: 134 mg/dL (ref 0–149)
VLDL Cholesterol Cal: 24 mg/dL (ref 5–40)

## 2019-11-29 LAB — VITAMIN D 25 HYDROXY (VIT D DEFICIENCY, FRACTURES): Vit D, 25-Hydroxy: 23.4 ng/mL — ABNORMAL LOW (ref 30.0–100.0)

## 2019-11-29 LAB — CBC
Hematocrit: 37.8 % (ref 34.0–46.6)
Hemoglobin: 12.5 g/dL (ref 11.1–15.9)
MCH: 27 pg (ref 26.6–33.0)
MCHC: 33.1 g/dL (ref 31.5–35.7)
MCV: 82 fL (ref 79–97)
Platelets: 232 10*3/uL (ref 150–450)
RBC: 4.63 x10E6/uL (ref 3.77–5.28)
RDW: 12.9 % (ref 11.7–15.4)
WBC: 6.8 10*3/uL (ref 3.4–10.8)

## 2019-11-29 LAB — TSH: TSH: 2.23 u[IU]/mL (ref 0.450–4.500)

## 2019-11-29 LAB — HEMOGLOBIN A1C
Est. average glucose Bld gHb Est-mCnc: 114 mg/dL
Hgb A1c MFr Bld: 5.6 % (ref 4.8–5.6)

## 2019-12-05 ENCOUNTER — Ambulatory Visit: Payer: BC Managed Care – PPO | Admitting: Podiatry

## 2019-12-05 ENCOUNTER — Ambulatory Visit (INDEPENDENT_AMBULATORY_CARE_PROVIDER_SITE_OTHER): Payer: BC Managed Care – PPO

## 2019-12-05 ENCOUNTER — Other Ambulatory Visit: Payer: Self-pay

## 2019-12-05 ENCOUNTER — Encounter: Payer: Self-pay | Admitting: Podiatry

## 2019-12-05 DIAGNOSIS — M2062 Acquired deformities of toe(s), unspecified, left foot: Secondary | ICD-10-CM

## 2019-12-05 DIAGNOSIS — M2022 Hallux rigidus, left foot: Secondary | ICD-10-CM

## 2019-12-05 DIAGNOSIS — M2011 Hallux valgus (acquired), right foot: Secondary | ICD-10-CM | POA: Diagnosis not present

## 2019-12-05 DIAGNOSIS — M779 Enthesopathy, unspecified: Secondary | ICD-10-CM | POA: Diagnosis not present

## 2019-12-05 NOTE — Patient Instructions (Signed)
Pre-Operative Instructions  Congratulations, you have decided to take an important step towards improving your quality of life.  You can be assured that the doctors and staff at [facility] will be with you every step of the way.  Here are some important things you should know:  1. Plan to be at the surgery center/hospital at least 1 (one) hour prior to your scheduled time, unless otherwise directed by the surgical center/hospital staff.  You must have a responsible adult accompany you, remain during the surgery and drive you home.  Make sure you have directions to the surgical center/hospital to ensure you arrive on time. 2. If you are having surgery at [facility] or [facility] hospitals, you will need a copy of your medical history and physical form from your family physician within one month prior to the date of surgery. We will give you a form for your primary physician to complete.  3. We make every effort to accommodate the date you request for surgery.  However, there are times where surgery dates or times have to be moved.  We will contact you as soon as possible if a change in schedule is required.   4. No aspirin/ibuprofen for one week before surgery.  If you are on aspirin, any non-steroidal anti-inflammatory medications (Mobic, Aleve, Ibuprofen) should not be taken seven (7) days prior to your surgery.  You make take Tylenol for pain prior to surgery.  5. Medications - If you are taking daily heart and blood pressure medications, seizure, reflux, allergy, asthma, anxiety, pain or diabetes medications, make sure you notify the surgery center/hospital before the day of surgery so they can tell you which medications you should take or avoid the day of surgery. 6. No food or drink after midnight the night before surgery unless directed otherwise by surgical center/hospital staff. 7. No alcoholic beverages 24-hours prior to surgery.  No smoking 24-hours prior or 24-hours after  surgery. 8. Wear loose pants or shorts. They should be loose enough to fit over bandages, boots, and casts. 9. Don't wear slip-on shoes. Sneakers are preferred. 10. Bring your boot with you to the surgery center/hospital.  Also bring crutches or a walker if your physician has prescribed it for you.  If you do not have this equipment, it will be provided for you after surgery. 11. If you have not been contacted by the surgery center/hospital by the day before your surgery, call to confirm the date and time of your surgery. 12. Leave-time from work may vary depending on the type of surgery you have.  Appropriate arrangements should be made prior to surgery with your employer. 13. Prescriptions will be provided immediately following surgery by your doctor.  Fill these as soon as possible after surgery and take the medication as directed. Pain medications will not be refilled on weekends and must be approved by the doctor. 14. Remove nail polish on the operative foot and avoid getting pedicures prior to surgery. 15. Wash the night before surgery.  The night before surgery wash the foot and leg well with water and the antibacterial soap provided. Be sure to pay special attention to beneath the toenails and in between the toes.  Wash for at least three (3) minutes. Rinse thoroughly with water and dry well with a towel.  Perform this wash unless told not to do so by your physician.  Enclosed: 1 Ice pack (please put in freezer the night before surgery)   1 Hibiclens skin cleaner     Pre-op instructions  If you have any questions regarding the instructions, please do not hesitate to call our office.  [place]: [address], [place], [place] [address] -- [phone]  [place]: [address] , [place] [address] -- [phone]  [place]: [address], [place], [place] [address] -- [phone]   Website: [email or web address] 

## 2019-12-06 ENCOUNTER — Telehealth: Payer: Self-pay

## 2019-12-06 NOTE — Telephone Encounter (Signed)
-----   Message from Megan Salon, MD sent at 12/04/2019  6:03 AM EDT ----- Please let pt know her pap was normal but HR HPV was positive.  Her last pap (11/19) was neg with neg HR HPV.  The pap smear prior to that in 10/18 was neg with + HR HPV.  According to 2020 ASCCP guidelines, she needs repeat HR HPV based testing in 1 year.  Needs 12 month recall and out of any current recall.  Also needs appt in 1 year so if it further than that, can you change appt.    Her CBC, CMP, TSH and HB1C were all normal.  Her cholesterol had mildly elevated LDLs at 130.  According to the AHA/ACC cardiovascular risk calculator, her risk of a cardiovascular event in the next 10 years is 2.8% so she does not need treatment for this.  I will keep watching her cholesterol.  Lastly, her Vit D was 23.4.  goal is to be >30.  Can you confirm if she is taking the prescription Vit D still?  If so, she needs to continue at 50K weekly.  If not, she should be taking between 1000-2000 IU of OTC Vit D daily.  Thanks.

## 2019-12-06 NOTE — Telephone Encounter (Signed)
DOS 12/21/19  [facility] MPJ FUSION LT - [id number]  BC[employer] ST EFFECTIVE DATE - 08/19/19    In-Network   Max Per Benefit Period Year-to-Date Remaining     CoInsurance 20%      Deductible $1250.00 $1250.00     Out-Of-Pocket $4890.00 [id number]   Copay Not Applicable Coinsurance [id number]  per  [place]

## 2019-12-06 NOTE — Telephone Encounter (Signed)
Left message to call Dillon Livermore at [phone]. 

## 2019-12-09 NOTE — Telephone Encounter (Signed)
Patient notified of results. See lab 

## 2019-12-09 NOTE — Telephone Encounter (Signed)
Left message for call back.

## 2019-12-19 ENCOUNTER — Ambulatory Visit: Payer: BC Managed Care – PPO

## 2019-12-19 NOTE — Progress Notes (Signed)
Subjective:   Patient ID: Zenaida Niece, female   DOB: 59 y.o.   MRN: [id number]   HPI 59 year old female presents the office today for concerns of pain to her left big toe joint.  This is been ongoing issue for her and has been gradually getting worse.  She says it hurts with and without shoes.  He gets red and inflamed at times.  She denies any recent injury or trauma.  She has tried changing shoes, offloading the insignificant improvement.  She has no other concerns today.   Review of Systems  All other systems reviewed and are negative.  Past Medical History:  Diagnosis Date  . Abnormal pap 2000   h/o LEEP  . Asthma   . Contact lens/glasses fitting    wears contacts or glasses  . Depression    and anxiety  . Hemorrhoids, external   . IBS (irritable bowel syndrome)    questionable gluten allergy  . Mitral valve prolapse 2006   echo 2013-no MVP-normal valve-no regurg  . Schatzki's ring 1/07   of esophagus    Past Surgical History:  Procedure Laterality Date  . CESAREAN SECTION  1990  . CESAREAN SECTION  1992  . CHOLECYSTECTOMY  12/2005  . ESOPHAGEAL DILATION  1/07  . HEMORRHOID SURGERY N/A 03/01/2013   Procedure: HEMORRHOIDECTOMY;  Surgeon: Imogene Burn. Georgette Dover, MD;  Location: [facility];  Service: General;  Laterality: N/A;  . LEEP  1999  . lipoma removal  10/29/2011   abdominal wall  . SKIN BIOPSY Left 03/01/2013   Procedure: SKIN BIOPSY  X 2  ABDOMINAL WALL;  Surgeon: Imogene Burn. Georgette Dover, MD;  Location: [facility];  Service: General;  Laterality: Left;  . TUBAL LIGATION       Current Outpatient Medications:  .  albuterol (PROAIR HFA) 108 (90 Base) MCG/ACT inhaler, 2 puffs every 4 hours as needed for short of breath, wheezing or chest tightness, Disp: 18 g, Rfl: 1 .  budesonide-formoterol (SYMBICORT) 160-4.5 MCG/ACT inhaler, Inhale 2 puffs into the lungs 2 (two) times daily., Disp: 1 Inhaler, Rfl: 0 .  Vitamin D, Ergocalciferol, (DRISDOL) 1.25  MG (50000 UT) CAPS capsule, Take 1 capsule (50,000 Units total) by mouth every 7 (seven) days., Disp: 12 capsule, Rfl: 0  Allergies  Allergen Reactions  . Amoxicillin Rash         Objective:  Physical Exam  General: AAO x3, NAD  Dermatological: Skin is warm, dry and supple bilateral. Nails x 10 are well manicured; remaining integument appears unremarkable at this time. There are no open sores, no preulcerative lesions, no rash or signs of infection present.  Vascular: Dorsalis Pedis artery and Posterior Tibial artery pedal pulses are 2/4 bilateral with immedate capillary fill time. Pedal hair growth present. No varicosities and no lower extremity edema present bilateral. There is no pain with calf compression, swelling, warmth, erythema.   Neruologic: Grossly intact via light touch bilateral.   Musculoskeletal: There is decreased range of motion of the left first MPJ and there is crepitation with range of motion.  Dorsal spurring present of the first MPJ.  There is mild edema and erythema to the joint.  The erythema is localized to the dorsal aspect warmth and rubbing inside of her shoe.  Also decreased ankle motion on the right side but with minimal discomfort.  Muscular strength 5/5 in all groups tested bilateral.  Gait: Unassisted, Nonantalgic.       Assessment:   59 year old female left  first MPJ hallux rigidus, capsulitis    Plan:  -Treatment options discussed including all alternatives, risks, and complications -Etiology of symptoms were discussed -X-rays were obtained and reviewed with the patient.  Significant arthritic changes present of the first MPJ with dorsal spurring present.  There is no evidence of acute fracture. -We discussed both conservative as well as surgical treatment options.  At this time she wishes to proceed with surgical intervention.  Discussed options for her.  After discussion she elects to proceed with first MPJ arthrodesis. -The incision placement  as well as the postoperative course was discussed with the patient. I discussed risks of the surgery which include, but not limited to, infection, bleeding, pain, swelling, need for further surgery, delayed or nonhealing, painful or ugly scar, numbness or sensation changes, over/under correction, recurrence, transfer lesions, further deformity, hardware failure, DVT/PE, loss of toe/foot. Patient understands these risks and wishes to proceed with surgery. The surgical consent was reviewed with the patient all 3 pages were signed. No promises or guarantees were given to the outcome of the procedure. All questions were answered to the best of my ability. Before the surgery the patient was encouraged to call the office if there is any further questions. The surgery will be performed at the [facility] on an outpatient basis. -CAM boot dispensed for postop use.   No follow-ups on file.  Trula Slade DPM

## 2019-12-20 ENCOUNTER — Encounter: Payer: Self-pay | Admitting: Obstetrics & Gynecology

## 2019-12-20 LAB — COLOGUARD

## 2019-12-21 ENCOUNTER — Encounter: Payer: Self-pay | Admitting: Podiatry

## 2019-12-21 ENCOUNTER — Other Ambulatory Visit: Payer: Self-pay | Admitting: Podiatry

## 2019-12-21 DIAGNOSIS — M2022 Hallux rigidus, left foot: Secondary | ICD-10-CM

## 2019-12-21 MED ORDER — CLINDAMYCIN HCL 300 MG PO CAPS: 300.0000 mg | ORAL_CAPSULE | Freq: Three times a day (TID) | ORAL | 0 refills | Status: DC

## 2019-12-21 MED ORDER — PROMETHAZINE HCL 25 MG PO TABS: 25.0000 mg | ORAL_TABLET | Freq: Three times a day (TID) | ORAL | 0 refills | Status: DC | PRN

## 2019-12-21 MED ORDER — OXYCODONE-ACETAMINOPHEN 5-325 MG PO TABS: 1.0000 | ORAL_TABLET | Freq: Four times a day (QID) | ORAL | 0 refills | Status: DC | PRN

## 2019-12-21 NOTE — Progress Notes (Signed)
Postop medications sent 

## 2019-12-22 ENCOUNTER — Telehealth: Payer: Self-pay

## 2019-12-22 ENCOUNTER — Telehealth: Payer: Self-pay | Admitting: *Deleted

## 2019-12-22 NOTE — Telephone Encounter (Signed)
Called and left a message for the patient stating I was calling to check on patient after having surgery with Dr Jacqualyn Posey on Wednesday May 5th, 2021 and to call the [place] office at [phone]. Nicole Fleming

## 2019-12-22 NOTE — Telephone Encounter (Signed)
Per [employer] for cologuard, patients specimen was unable to be processed. They stated patient will need to call them. Left message for callback. Patient will need to call [phone] & press option #1

## 2019-12-22 NOTE — Telephone Encounter (Signed)
Pt states she is returning Ruqayyah's call, had surgery yesterday.

## 2019-12-23 NOTE — Telephone Encounter (Signed)
Tried to call the patient back today. Nicole Fleming

## 2019-12-23 NOTE — Telephone Encounter (Signed)
Patient states she just had surgery on her bone spur on her foot. She mailed the cologuard back without doing it because she knew she had surgery the next day & didn't want to have to mess with it. She is aware of importance of doing it & was given the phone number to contact [employer] once she is healed so they can send her another one.  Routing to Dr Sabra Heck.

## 2019-12-23 NOTE — Telephone Encounter (Signed)
Left message for call back.

## 2019-12-26 ENCOUNTER — Other Ambulatory Visit: Payer: Self-pay

## 2019-12-26 ENCOUNTER — Ambulatory Visit (INDEPENDENT_AMBULATORY_CARE_PROVIDER_SITE_OTHER): Payer: BC Managed Care – PPO | Admitting: Podiatry

## 2019-12-26 ENCOUNTER — Encounter: Payer: Self-pay | Admitting: Podiatry

## 2019-12-26 ENCOUNTER — Ambulatory Visit (INDEPENDENT_AMBULATORY_CARE_PROVIDER_SITE_OTHER): Payer: BC Managed Care – PPO

## 2019-12-26 VITALS — BP 132/82 | HR 93 | Temp 97.1°F | Resp 14

## 2019-12-26 DIAGNOSIS — M779 Enthesopathy, unspecified: Secondary | ICD-10-CM

## 2019-12-26 DIAGNOSIS — M2022 Hallux rigidus, left foot: Secondary | ICD-10-CM

## 2019-12-26 NOTE — Progress Notes (Signed)
Subjective: Nicole Fleming is a 59 y.o. is seen today in office s/p left 1st MTPJ arthrodesis preformed on 12/21/2019.  Overall she is doing better and pain is improved and she is not taking any pain medicine the last couple of days.  She is still in the cam boot.  Denies any systemic complaints such as fevers, chills, nausea, vomiting. No calf pain, chest pain, shortness of breath.   Objective: General: No acute distress, AAOx3  DP/PT pulses palpable 2/4, CRT < 3 sec to all digits.  Protective sensation intact. Motor function intact.  LEFT foot: Incision is well coapted without any evidence of dehiscence with sutures intact. There is no surrounding erythema, ascending cellulitis, fluctuance, crepitus, malodor, drainage/purulence. There is mild edema around the surgical site. There is no significant pain along the surgical site.  Mild ecchymosis present No other areas of tenderness to bilateral lower extremities.  No other open lesions or pre-ulcerative lesions.  No pain with calf compression, swelling, warmth, erythema.   Assessment and Plan:  Status post left foot surgery, doing well with no complications   -Treatment options discussed including all alternatives, risks, and complications -X-rays obtained reviewed.  Status post first MPJ arthrodesis with hardware intact for any complicating factors. -Antibiotic ointment and a bandage applied.  Keep the dressing clean, dry, intact -Continue cam boot and nonweightbearing -Ice/elevation -Pain medication as needed. -Monitor for any clinical signs or symptoms of infection and DVT/PE and directed to call the office immediately should any occur or go to the ER. -Follow-up as scheduled for possible suture removal or sooner if any problems arise. In the meantime, encouraged to call the office with any questions, concerns, change in symptoms.   Celesta Gentile, DPM

## 2020-01-05 ENCOUNTER — Ambulatory Visit (INDEPENDENT_AMBULATORY_CARE_PROVIDER_SITE_OTHER): Payer: BC Managed Care – PPO | Admitting: Podiatry

## 2020-01-05 ENCOUNTER — Other Ambulatory Visit: Payer: Self-pay

## 2020-01-05 DIAGNOSIS — M2022 Hallux rigidus, left foot: Secondary | ICD-10-CM

## 2020-01-06 NOTE — Progress Notes (Signed)
Subjective: Nicole Fleming is a 59 y.o. is seen today in office s/p left 1st MTPJ arthrodesis preformed on 12/21/2019.  She states that she does not have any concerns except she is noted some swelling still present she points to the submetatarsal 1 area.  She has been in the cam boot dressing off her foot is much as possible.  She is not taking any pain medicine currently.  She has no other concerns. Denies any systemic complaints such as fevers, chills, nausea, vomiting. No calf pain, chest pain, shortness of breath.   Objective: General: No acute distress, AAOx3  DP/PT pulses palpable 2/4, CRT < 3 sec to all digits.  Protective sensation intact. Motor function intact.  LEFT foot: Incision is well coapted without any evidence of dehiscence with sutures intact. There is no surrounding erythema, ascending cellulitis, fluctuance, crepitus, malodor, drainage/purulence. There is mild edema mostly submetatarsal 1.  There is no warmth or any evidence of fluctuation or abscess/infection.  No tenderness to this area.  There is no significant pain on surgical site otherwise.  No other areas of tenderness to bilateral lower extremities.  No other open lesions or pre-ulcerative lesions.  No pain with calf compression, swelling, warmth, erythema.   Assessment and Plan:  Status post left foot surgery, doing well with no complications   -Treatment options discussed including all alternatives, risks, and complications -I removed half the sutures today.  Antibiotic ointment and a bandage applied.  Keep the dressing clean, dry, intact. -Remain in cam boot nonweightbearing.  Continue to ice elevate. -Ice/elevation -Pain medication as needed. -Monitor for any clinical signs or symptoms of infection and DVT/PE and directed to call the office immediately should any occur or go to the ER. -Follow-up as scheduled for possible suture removal or sooner if any problems arise. In the meantime, encouraged to call the office  with any questions, concerns, change in symptoms.   Celesta Gentile, DPM

## 2020-01-10 ENCOUNTER — Other Ambulatory Visit: Payer: Self-pay

## 2020-01-10 ENCOUNTER — Ambulatory Visit
Admission: RE | Admit: 2020-01-10 | Discharge: 2020-01-10 | Disposition: A | Discharge status: Home / Self Care | Payer: BC Managed Care – PPO | Source: Ambulatory Visit | Attending: Obstetrics & Gynecology | Admitting: Obstetrics & Gynecology

## 2020-01-10 DIAGNOSIS — Z1231 Encounter for screening mammogram for malignant neoplasm of breast: Secondary | ICD-10-CM

## 2020-01-19 ENCOUNTER — Encounter: Payer: BC Managed Care – PPO | Admitting: Podiatry

## 2020-01-21 ENCOUNTER — Ambulatory Visit (INDEPENDENT_AMBULATORY_CARE_PROVIDER_SITE_OTHER): Payer: BC Managed Care – PPO

## 2020-01-21 ENCOUNTER — Other Ambulatory Visit: Payer: Self-pay

## 2020-01-21 ENCOUNTER — Ambulatory Visit (INDEPENDENT_AMBULATORY_CARE_PROVIDER_SITE_OTHER): Payer: BC Managed Care – PPO | Admitting: Podiatry

## 2020-01-21 ENCOUNTER — Encounter: Payer: Self-pay | Admitting: Podiatry

## 2020-01-21 DIAGNOSIS — M2022 Hallux rigidus, left foot: Secondary | ICD-10-CM

## 2020-01-21 DIAGNOSIS — M779 Enthesopathy, unspecified: Secondary | ICD-10-CM

## 2020-01-23 DIAGNOSIS — M2022 Hallux rigidus, left foot: Secondary | ICD-10-CM | POA: Insufficient documentation

## 2020-01-23 NOTE — Progress Notes (Signed)
Subjective: Nicole Fleming is a 59 y.o. is seen today in office s/p left 1st MTPJ arthrodesis preformed on 12/21/2019.  Presents here for suture removal.  Overall she states that she is doing well with minimal discomfort.  She does put her foot down on the ground some in the boot but otherwise been doing well she denies any significant pain and not taking any pain medication.  Denies any systemic complaints such as fevers, chills, nausea, vomiting. No calf pain, chest pain, shortness of breath.   Objective: General: No acute distress, AAOx3  DP/PT pulses palpable 2/4, CRT < 3 sec to all digits.  Protective sensation intact. Motor function intact.  LEFT foot: Incision is well coapted without any evidence of dehiscence with sutures intact.  Incision appears to be healing well but any evidence of dehiscence or any signs of infection.  There is no erythema, ascending cellulitis.  No drainage or pus or any fluctuation or crepitation.  There is no malodor.  The toe is in rectus position.  No other areas of tenderness to bilateral lower extremities.  No other open lesions or pre-ulcerative lesions.  No pain with calf compression, swelling, warmth, erythema.   Assessment and Plan:  Status post left foot surgery, doing well with no complications   -Treatment options discussed including all alternatives, risks, and complications -X-rays obtained and reviewed.  Hardware intact status post first MPJ arthrodesis without any complicating factors or hardware failure. -Remove the remainder the sutures today and incision remained healing well.  Antibiotic ointment and a bandage applied and to start to shower tomorrow.  I would remain in the cam boot and she can be partial weightbearing as tolerated for the next 2 weeks.  Continue ice elevation as well as compression anklet. -Monitor for any clinical signs or symptoms of infection and DVT/PE and directed to call the office immediately should any occur or go to the  ER. -Follow-up as scheduled for possible suture removal or sooner if any problems arise. In the meantime, encouraged to call the office with any questions, concerns, change in symptoms.   Celesta Gentile, DPM

## 2020-02-07 ENCOUNTER — Ambulatory Visit (INDEPENDENT_AMBULATORY_CARE_PROVIDER_SITE_OTHER): Payer: BC Managed Care – PPO | Admitting: Podiatry

## 2020-02-07 ENCOUNTER — Ambulatory Visit (INDEPENDENT_AMBULATORY_CARE_PROVIDER_SITE_OTHER): Payer: BC Managed Care – PPO

## 2020-02-07 ENCOUNTER — Other Ambulatory Visit: Payer: Self-pay

## 2020-02-07 DIAGNOSIS — M2022 Hallux rigidus, left foot: Secondary | ICD-10-CM | POA: Diagnosis not present

## 2020-02-12 NOTE — Progress Notes (Signed)
Subjective: Nicole Fleming is a 59 y.o. is seen today in office s/p left 1st MTPJ arthrodesis preformed on 12/21/2019.  She states that she is doing well and she is ready to get back into regular shoe.  She has very minimal occasional discomfort.  She is not taking anything.  She is working 8 hours a day currently but sitting.  She has no new concerns today. Denies any systemic complaints such as fevers, chills, nausea, vomiting. No calf pain, chest pain, shortness of breath.   Objective: General: No acute distress, AAOx3  DP/PT pulses palpable 2/4, CRT < 3 sec to all digits.  Protective sensation intact. Motor function intact.  LEFT foot: Incision is well coapted without any evidence of dehiscence and the scar is formed.  There is decreased edema.  There is no erythema or warmth.  No significant tenderness palpation at surgical site.  No other areas of discomfort.  No other open lesions or pre-ulcerative lesions.  No pain with calf compression, swelling, warmth, erythema.   Assessment and Plan:  Status post left foot surgery, doing well with no complications   -Treatment options discussed including all alternatives, risks, and complications -X-rays obtained and reviewed.  Hardware intact status post first MPJ arthrodesis without any complicating factors or hardware failure.  Increased consolidation across the arthrodesis site. -Discussed with her to transition as tolerated to wearing a regular shoe.  Discussed gradual transition out of the cam boot.  Continue ice elevate as well as compression anklet.  Discussed not going barefoot.  She has been doing this intermittently only for short times.  Return in about 3 weeks (around 02/28/2020).  Trula Slade DPM

## 2020-02-28 ENCOUNTER — Ambulatory Visit (INDEPENDENT_AMBULATORY_CARE_PROVIDER_SITE_OTHER): Payer: BC Managed Care – PPO | Admitting: Podiatry

## 2020-02-28 ENCOUNTER — Encounter: Payer: Self-pay | Admitting: Podiatry

## 2020-02-28 ENCOUNTER — Other Ambulatory Visit: Payer: Self-pay

## 2020-02-28 ENCOUNTER — Ambulatory Visit (INDEPENDENT_AMBULATORY_CARE_PROVIDER_SITE_OTHER): Payer: BC Managed Care – PPO

## 2020-02-28 DIAGNOSIS — M2022 Hallux rigidus, left foot: Secondary | ICD-10-CM

## 2020-02-29 NOTE — Progress Notes (Signed)
Subjective: Nicole Fleming is a 59 y.o. is seen today in office s/p left 1st MTPJ arthrodesis preformed on 12/21/2019.  She states that she is doing great she is back to her regular shoe over the last week.  She started gradually increase her activity level.  She does have that she is actually mowed the yard.  She has no significant discomfort.  She is also about her work. Denies any systemic complaints such as fevers, chills, nausea, vomiting. No calf pain, chest pain, shortness of breath.   Objective: General: No acute distress, AAOx3  DP/PT pulses palpable 2/4, CRT < 3 sec to all digits.  Protective sensation intact. Motor function intact.  LEFT foot: Incision is well coapted without any evidence of dehiscence and the scar is formed.  There is minimal edema to the surgical site there is no erythema or warmth.  Arthrodesis site appears to be stable.  No other areas of discomfort identified today.  Flexor, extensor tendons appear to be intact.  No other open lesions or pre-ulcerative lesions.  No pain with calf compression, swelling, warmth, erythema.   Assessment and Plan:  Status post left foot surgery, doing well with no complications   -Treatment options discussed including all alternatives, risks, and complications -X-rays obtained reviewed.  Hardware intact without any complicating factors and there is increased consolidation across the fracture site. -On her continue with wearing regular shoes on her gradually increase her activity level.  She is to be going on vacation the beginning of August and doing a lot of walking.  Discussed may be wearing her cam boot if needed.  On her continue ice elevate as well as wear compression.  Return in about 6 weeks (around 04/10/2020). or if she is doing well I can see her as needed.  Trula Slade DPM

## 2020-02-29 NOTE — Addendum Note (Signed)
Addended by: Cranford Mon R on: 02/29/2020 07:49 AM   Modules accepted: Orders

## 2020-03-06 ENCOUNTER — Telehealth: Payer: Self-pay | Admitting: *Deleted

## 2020-03-06 NOTE — Telephone Encounter (Signed)
-----   Message from Megan Salon, MD sent at 03/05/2020  4:27 PM EDT ----- Contact: [phone] Elevated LDLs, low vit D, irritable bowel syndrome are the other appropriate codes to use.  Thanks.  Vinnie Level  ----- Message ----- From: Huey Romans, RN Sent: 03/05/2020   2:18 PM EDT To: Megan Salon, MD  DR Sabra Heck, these were coded as screening. Would any of her labs be coded as anything other than screening labs?  Thank you. ----- Message ----- From: Salome Arnt Sent: 02/28/2020   2:05 PM EDT To: Huey Romans, RN  Patient would like to speak with someone because she received a bill for labs that were coded wrong for annual.

## 2020-03-06 NOTE — Telephone Encounter (Signed)
Call to patient. See account notes. Message to [facility] to add codes and re-file.  Encounter closed.

## 2020-04-10 ENCOUNTER — Encounter: Payer: Self-pay | Admitting: Podiatry

## 2020-04-10 ENCOUNTER — Ambulatory Visit (INDEPENDENT_AMBULATORY_CARE_PROVIDER_SITE_OTHER): Payer: BC Managed Care – PPO

## 2020-04-10 ENCOUNTER — Ambulatory Visit (INDEPENDENT_AMBULATORY_CARE_PROVIDER_SITE_OTHER): Payer: BC Managed Care – PPO | Admitting: Podiatry

## 2020-04-10 ENCOUNTER — Other Ambulatory Visit: Payer: Self-pay

## 2020-04-10 DIAGNOSIS — M2022 Hallux rigidus, left foot: Secondary | ICD-10-CM

## 2020-04-10 DIAGNOSIS — Z9889 Other specified postprocedural states: Secondary | ICD-10-CM

## 2020-04-15 NOTE — Progress Notes (Signed)
Subjective: Nicole Fleming is a 59 y.o. is seen today in office s/p left 1st MTPJ arthrodesis preformed on 12/21/2019.  She states that she is doing "fine".  She denies any significant issues with surgical site and overall she is very happy with the outcome.  She states that her foot does feel better when she wear shoes or throughout her foot but otherwise she has no pain.  Gets some occasional swelling still but improved. Denies any systemic complaints such as fevers, chills, nausea, vomiting. No calf pain, chest pain, shortness of breath.   Objective: General: No acute distress, AAOx3  DP/PT pulses palpable 2/4, CRT < 3 sec to all digits.  Protective sensation intact. Motor function intact.  LEFT foot: Incision is well coapted without any evidence of dehiscence and the scar is formed.  There is no significant tenderness palpation at surgical site.  Arthrodesis site appears to be stable.  There is no tenderness palpation surgical site or other areas of the foot.  No other open lesions or pre-ulcerative lesions.  No pain with calf compression, swelling, warmth, erythema.   Assessment and Plan:  Status post left foot surgery, doing well with no complications   -Treatment options discussed including all alternatives, risks, and complications -X-rays obtained reviewed.  Hardware intact without any complicating factors and there is increased consolidation across the fracture site.  There is no loosening of the hardware. -Continue with supportive shoes we discussed inserts as needed.  She can use a compression anklet if needed as well for swelling she is on her feet.  Otherwise she has been doing well at discharge from the postoperative course and she comes to this plan has no further questions or concerns today.  Encouraged to call if there are any issues.  Trula Slade DPM

## 2020-08-21 ENCOUNTER — Other Ambulatory Visit: Payer: Self-pay | Admitting: Internal Medicine

## 2020-11-14 IMAGING — MG DIGITAL SCREENING BILAT W/ CAD
4 series · 4 of 4 positions shown · non-contrast
Comparison: Previous exam(s).

CLINICAL DATA: Screening.

EXAM:
DIGITAL SCREENING BILATERAL MAMMOGRAM WITH CAD

[R CC]
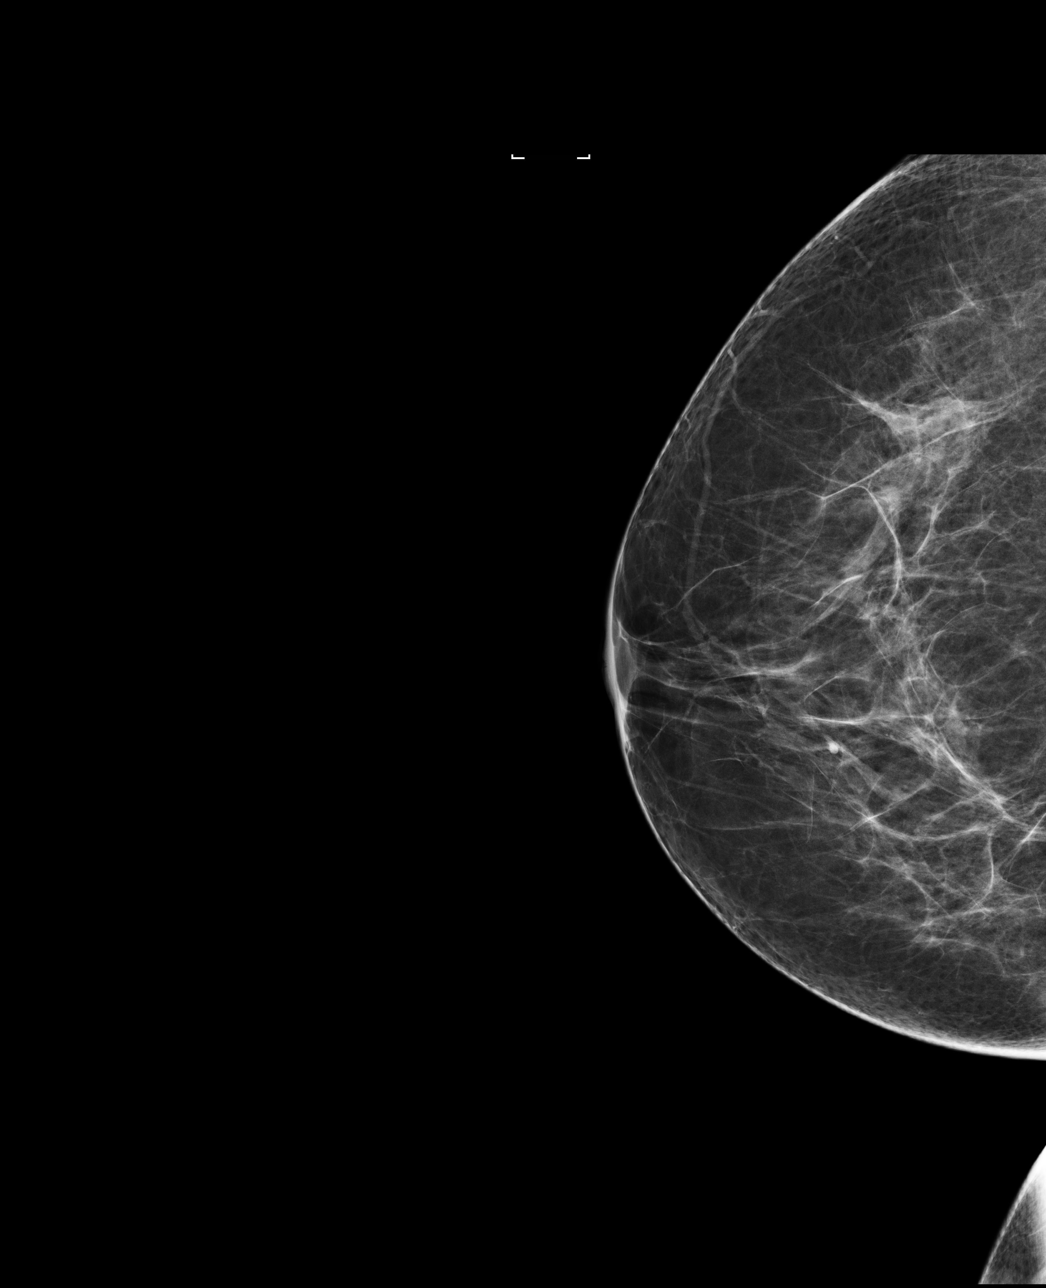

[R MLO]
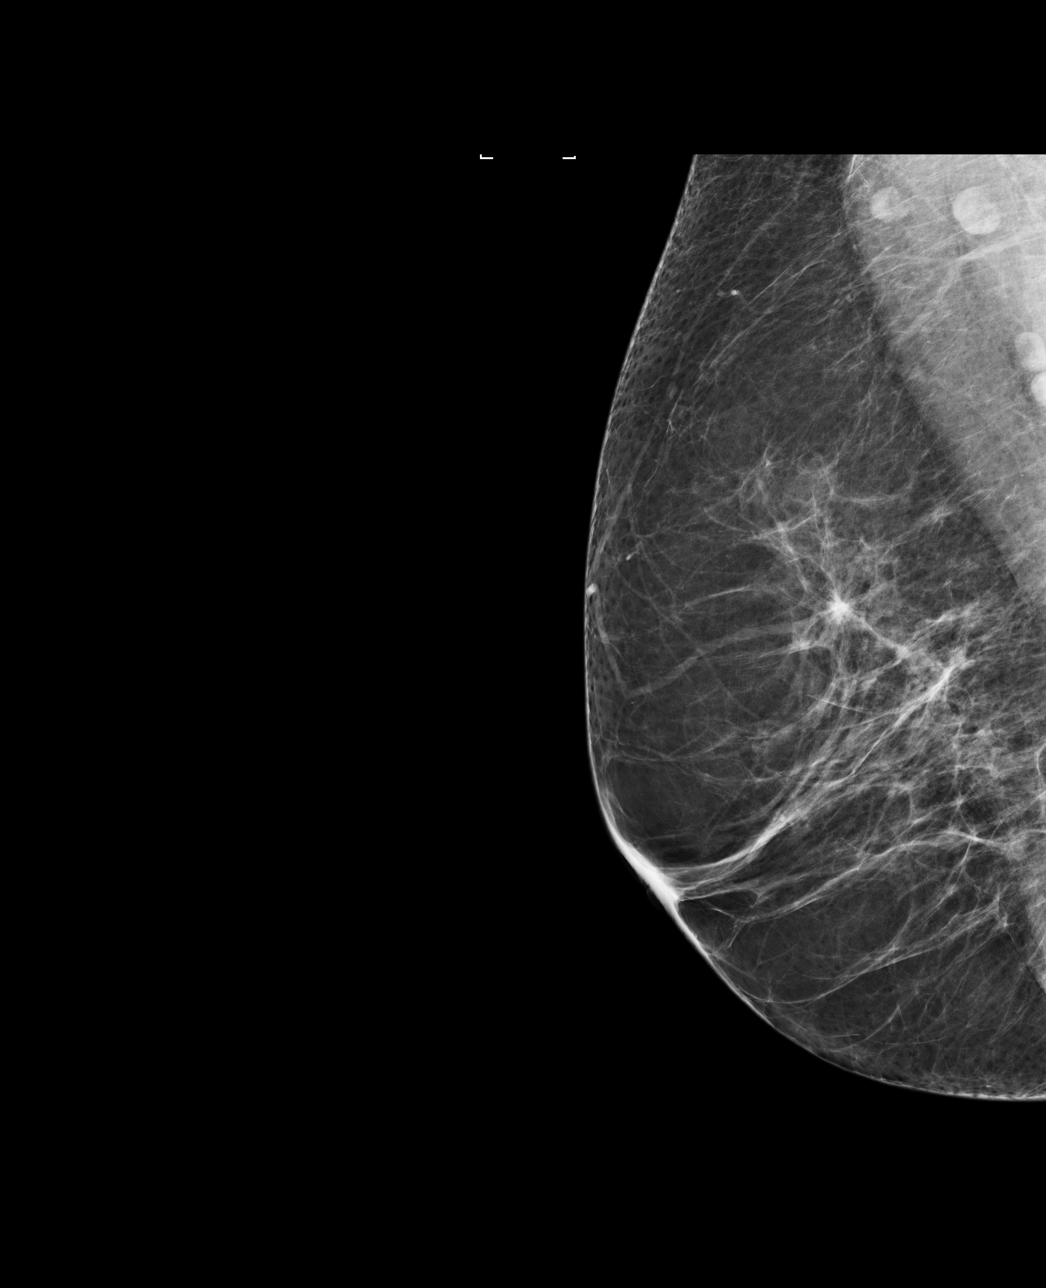

[L MLO]
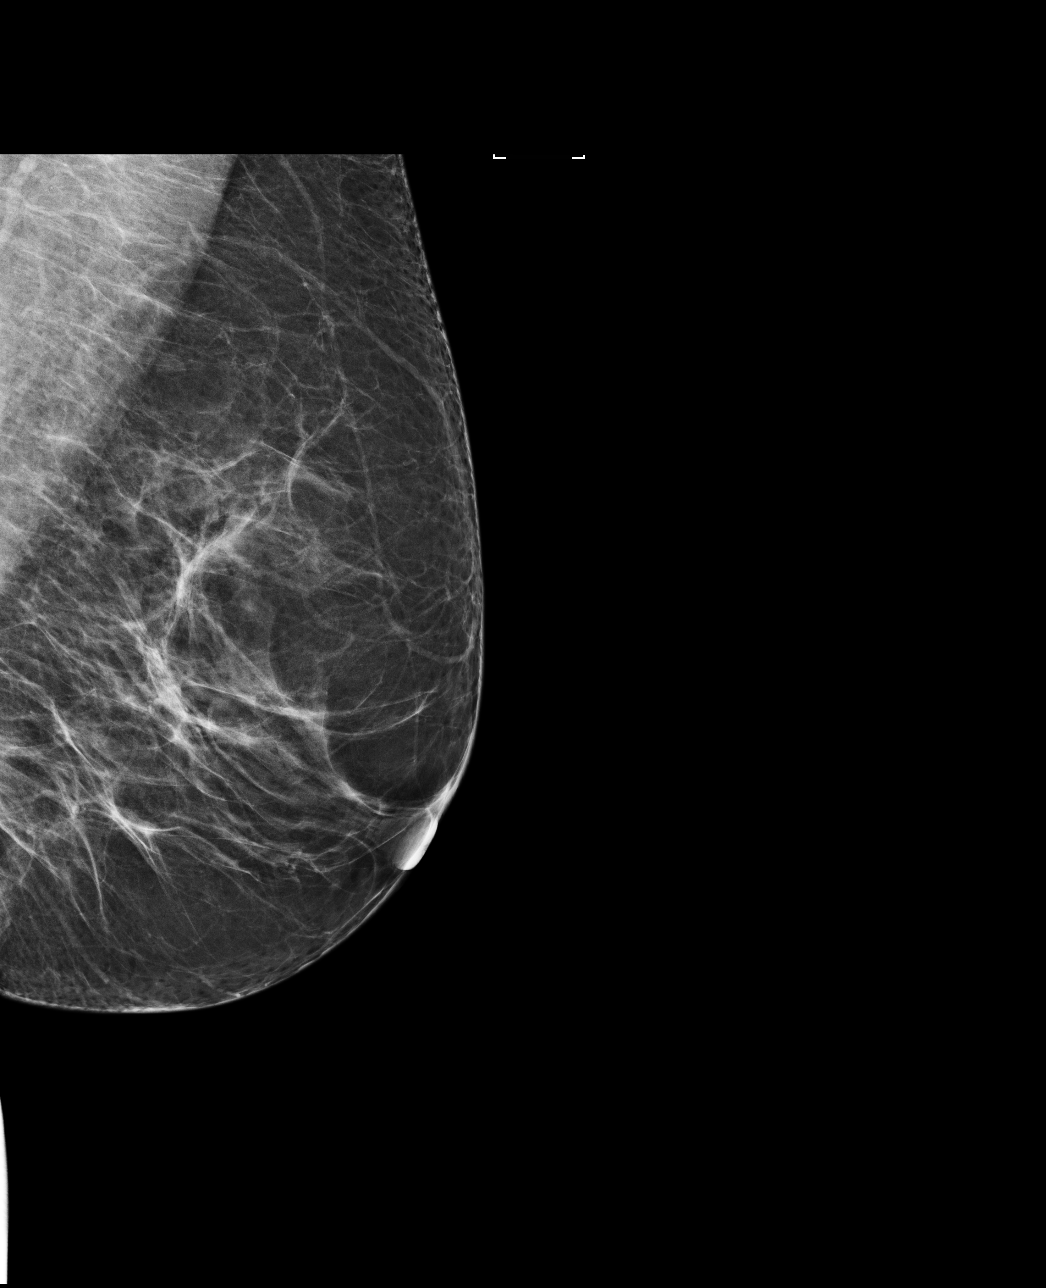

[L CC]
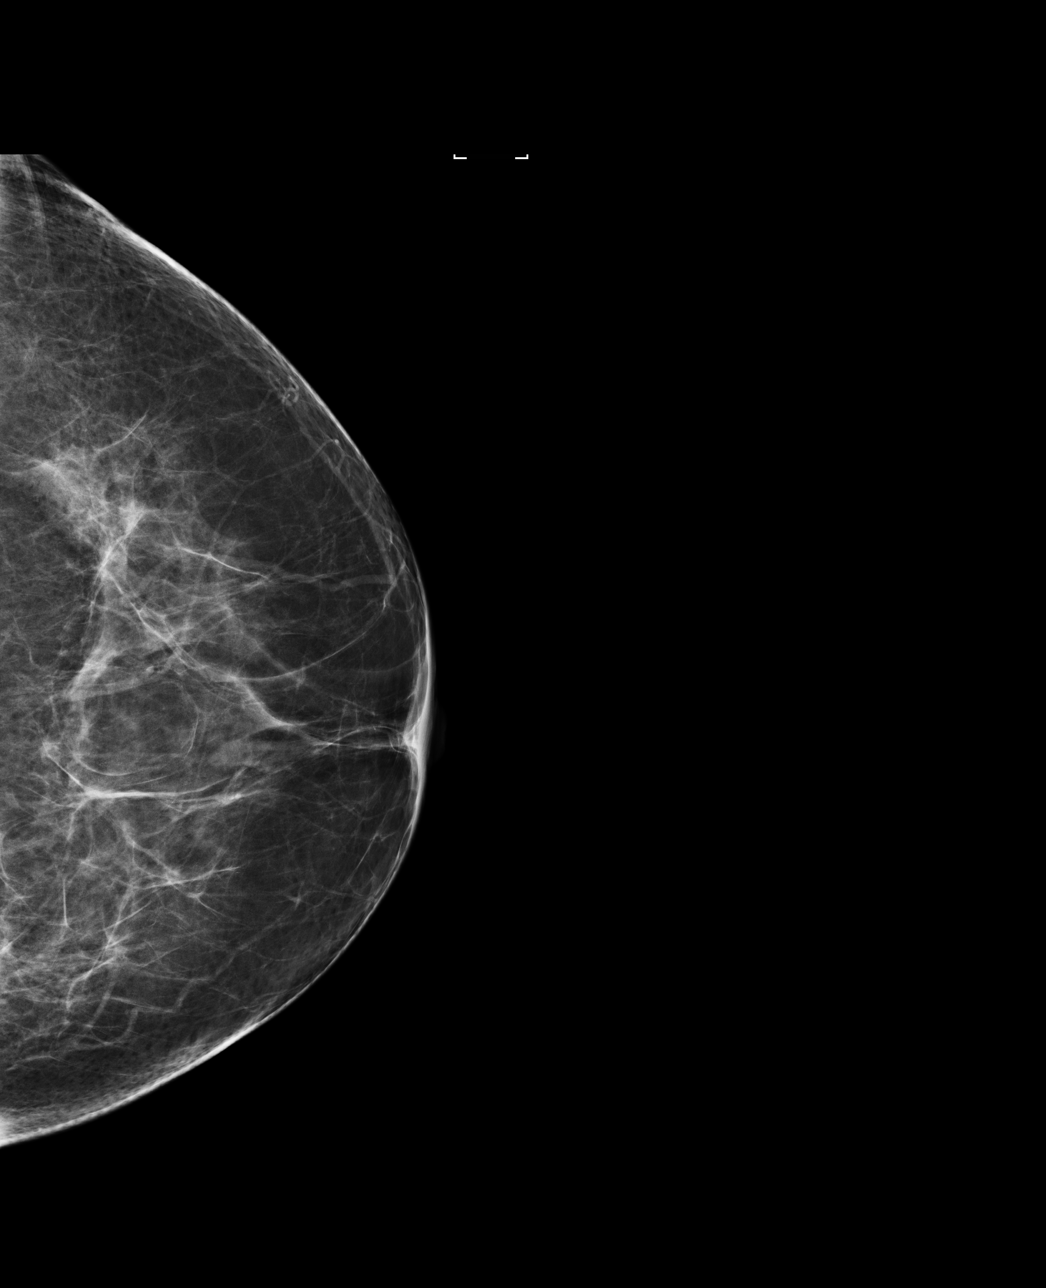

[4 of 4 positions shown; findings below may reference images not displayed]

ACR Breast Density Category b: There are scattered areas of
fibroglandular density.
FINDINGS: There are no findings suspicious for malignancy. Images were
processed with CAD.
IMPRESSION: No mammographic evidence of malignancy. A result letter of this
screening mammogram will be mailed directly to the patient.

RECOMMENDATION:
Screening mammogram in one year. (Code:AS-G-LCT)

BI-RADS CATEGORY  1: Negative.

## 2020-12-11 ENCOUNTER — Ambulatory Visit: Payer: BC Managed Care – PPO | Admitting: Internal Medicine

## 2020-12-13 ENCOUNTER — Other Ambulatory Visit: Payer: Self-pay

## 2020-12-13 ENCOUNTER — Encounter: Payer: Self-pay | Admitting: Internal Medicine

## 2020-12-13 ENCOUNTER — Ambulatory Visit (INDEPENDENT_AMBULATORY_CARE_PROVIDER_SITE_OTHER): Payer: BC Managed Care – PPO | Admitting: Internal Medicine

## 2020-12-13 DIAGNOSIS — J4530 Mild persistent asthma, uncomplicated: Secondary | ICD-10-CM

## 2020-12-13 MED ORDER — BUDESONIDE-FORMOTEROL FUMARATE 160-4.5 MCG/ACT IN AERO
2.0000 | INHALATION_SPRAY | Freq: Two times a day (BID) | RESPIRATORY_TRACT | 11 refills | Status: DC
Start: 2020-12-13 — End: 2022-03-31

## 2020-12-13 NOTE — Patient Instructions (Addendum)
Plan A = Automatic = Always=   Symbicort 160 Take 2 puffs first thing in am and then another 2 puffs about 12 hours later.    Plan B = Backup (to supplement plan A, not to replace it) Only use your albuterol inhaler as a rescue medication to be used if you can't catch your breath by resting or doing a relaxed purse lip breathing pattern.  - The less you use it, the better it will work when you need it. - Ok to use the inhaler up to 2 puffs  every 4 hours if you must but call for appointment if use goes up over your usual need - Don't leave home without it !!  (think of it like the spare tire for your car)   Try albuterol 15 min before an activity that you know would make you short of breath and see if it makes any difference and if makes none then don't take it after activity unless you can't catch your breath.   If you continue to have chest tightness that doesn't respond to this then next step is:  Try prilosec otc 20mg   Take 30-60 min before first meal of the day and Pepcid ac (famotidine) 20 mg after supper  until return.  GERD (REFLUX)  is an extremely common cause of respiratory symptoms just like yours , many times with no obvious heartburn at all.    It can be treated with medication, but also with lifestyle changes including elevation of the head of your bed (ideally with 6 -8inch blocks under the headboard of your bed),  Smoking cessation, avoidance of late meals, excessive alcohol, and avoid fatty foods, chocolate, peppermint, colas, red wine, and acidic juices such as orange juice.  NO MINT OR MENTHOL PRODUCTS SO NO COUGH DROPS  USE SUGARLESS CANDY INSTEAD (Jolley ranchers or Stover's or Life Savers) or even ice chips will also do - the key is to swallow to prevent all throat clearing. NO OIL BASED VITAMINS - use powdered substitutes.  Avoid fish oil when coughing.     Please schedule a follow up office visit in 6 weeks, call sooner if needed

## 2020-12-13 NOTE — Progress Notes (Signed)
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 Was prescribed advair but apparently never took it and referred to pulmonary clinic 07/20/2018 by Dr   Nicole Fleming.     History of Present Illness  07/20/2018  Pulmonary/ 1st office eval/Nicole Fleming  Chief Complaint  Patient presents with  . Pulmonary Consult    Referred by Dr. Edwinna Fleming. Pt c/o SOB x 6 months.  She states SOB can occue with or without exertion.  She is using her albuterol inhaler 2-4 x per day.   Dyspnea:  MMRC1 = can walk nl pace, flat grade, can't hurry or go uphills or steps s sob  Episodes of sob at rest resolve w/in 5 min p saba varies up to 4 x daily max assoc with chest tightness than never comes on with ex  Walks dog up to 19mile loop some hills/ all conditions   Cough: dry cough x 10 years - more day than noct - wakes her up  Maybe once a week Sleep: ok to lie flat sound sleep / 4/7 nights at bedtime feels need for hs saba also  SABA use: freq needs  p supper o/w no pattern to daytime use  rec Plan A = Automatic = Symbicort 160 Take 2 puffs first thing in am and then another 2 puffs about 12 hours later.  Work on inhaler technique:  Plan B = Backup Only use your albuterol inhaler        08/25/2018  f/u ov/Nicole Fleming re: asthma vs panic disorder ? better on symb 160 2bid but never filled rx "the sample has not run out " (15 day given 07/20/18)  Chief Complaint  Patient presents with  . Follow-up    pt states she is doing well, does note some dyspnea both at rest  and exertion.  Denies wheezing, cough, mucus production.   Dyspnea:  Walking dog daily up some hills / walks all over school s problem with sob but no aerobics   Cough: none Sleeping: no longer using albuterol at bedtime, still occ spell wakes her but much more likely at rest sitting - see below  SABA use: none at all since last ov  02: no  Spells of sob at rest assoc with panic sensation lasting up to 10 min and resolves s rx  - this too though goes away p depomedrol  rec Continue symbicort 160 Take 2 puffs first thing in am and then another 2 puffs about 12 hours later.  Only use your albuterol as a rescue medication     12/13/2020  Re-establish  ov/Nicole Fleming re: asthma vs panic disorder  Heart racing and sob since death of her mother  Chief Complaint  Patient presents with  . Follow-up    Patient is having tightness in chest that comes and goes. Started a couple weeks ago. Denies cough  Dyspnea:  Walks up to 2 miles a day s symptoms of palpitations though ex tol is not as  good as used to be, no chest tightness with ex  Cough:  None  Sleeping: rarely wakes up with any sob/ chest tightness or sob  SABA use: 6 x in last year on just symbicort 160 x 2 each am  02: none  Covid status:   X 3 pfizer    No obvious day to day or daytime variability or assoc excess/ purulent sputum or mucus plugs or hemoptysis or cp   subjective wheeze or overt sinus or hb symptoms.   Most nights sleeping  without nocturnal  or early am exacerbation  of respiratory  c/o's or need for noct saba. Also denies any obvious fluctuation of symptoms with weather or environmental changes or other aggravating or alleviating factors except as outlined above   No unusual exposure hx or h/o childhood pna/ asthma or knowledge of premature birth.  Current Allergies, Complete Past Medical History, Past Surgical History, Family History, and Social History were reviewed in [facility] record.  ROS  The  following are not active complaints unless bolded Hoarseness, sore throat, dysphagia, dental problems, itching, sneezing,  nasal congestion or discharge of excess mucus or purulent secretions, ear ache,   fever, chills, sweats, unintended wt loss or wt gain, classically pleuritic or exertional cp,  orthopnea pnd or arm/hand swelling  or leg swelling, presyncope, palpitations, abdominal pain, anorexia, nausea, vomiting, diarrhea  or change in bowel habits or change in bladder habits, change in stools or change in urine, dysuria, hematuria,  rash, arthralgias, visual complaints, headache, numbness, weakness or ataxia or problems with walking or coordination,  change in mood or  memory.        Current Meds  Medication Sig  . albuterol (PROAIR HFA) 108 (90 Base) MCG/ACT inhaler 2 puffs every 4 hours as needed for short of breath, wheezing or chest tightness  . budesonide-formoterol (SYMBICORT) 160-4.5 MCG/ACT inhaler Inhale 2 puffs into the lungs 2 (two) times daily.  . Vitamin D, Ergocalciferol, (DRISDOL) 1.25 MG (50000 UT) CAPS capsule Take 1 capsule (50,000 Units total) by mouth every 7 (seven) days.               Objective:     12/13/2020        179  08/25/18 170 lb 9.6 oz (77.4 kg)  07/20/18 171 lb 9.6 oz (77.8 kg)  07/13/18 171 lb (77.6 kg)    Vital signs reviewed  12/13/2020  - Note at rest 02 sats  96% on RA   General appearance:    Pleasant somewhat tense amb wf    HEENT : pt wearing mask not removed for exam due to covid -19 concerns.    NECK :  without JVD/Nodes/TM/ nl carotid upstrokes bilaterally   LUNGS: no acc muscle use,  Nl contour chest which is clear to A and P bilaterally without cough on insp or exp maneuvers   CV:  RRR  no s3 or murmur or increase in P2, and no edema   ABD:  soft and nontender with nl inspiratory excursion in the supine position. No bruits or organomegaly appreciated, bowel sounds nl  MS:  Nl gait/ ext warm without deformities, calf  tenderness, cyanosis or clubbing No obvious joint restrictions   SKIN: warm and dry without lesions    NEURO:  alert, approp, nl sensorium with  no motor or cerebellar deficits apparent.  Assessment

## 2020-12-14 ENCOUNTER — Encounter: Payer: Self-pay | Admitting: Internal Medicine

## 2020-12-14 NOTE — Assessment & Plan Note (Signed)
Onset of atypical symptoms 2009 FENO 07/20/2018  =   75 Spirometry 07/20/2018  FEV1 2.4 (81%)  Ratio 70 min curvature off saba on day of study   - 07/20/2018    try symbiocort 160 2bid  - FENO 08/25/2018  =   35 not taking symb 160 consistently  - 08/25/2018  After extensive coaching inhaler device,  effectiveness =    90% so rec resume symbicort 160 2bid  - Nov 2020 flare off symbicort bid (taking prn)  rec symb 160 2bid and Prednisone 10 mg take  4 each am x 2 days,   2 each am x 2 days,  1 each am x 2 days and stop and f/u in 4 weeks / zyrtec prn  - 12/13/2020  After extensive coaching inhaler device,  effectiveness =    90% > resume symbicort 160 Take 2 puffs first thing in am and then another 2 puffs about 12 hours later.   Assoc with chest tightness not reproduced with exertion since her mother died may either be anxiety or GERD or active asthma from undertreatment with symbicort 160 so rec  1) resume symbicort 160 2bid 2) re saba: I spent extra time with pt today reviewing appropriate use of albuterol for prn use on exertion with the following points: 1) saba is for relief of sob that does not improve by walking a slower pace or resting but rather if the pt does not improve after trying this first. 2) If the pt is convinced, as many are, that saba helps recover from activity faster then it's easy to tell if this is the case by re-challenging : ie stop, take the inhaler, then p 5 minutes try the exact same activity (intensity of workload) that just caused the symptoms and see if they are substantially diminished or not after saba 3) if there is an activity that reproducibly causes the symptoms, try the saba 15 min before the activity on alternate days   If in fact the saba really does help, then fine to continue to use it prn but advised may need to look closer at the maintenance regimen being used to achieve better control of airways disease with exertion.   3) if not improving w/in a week start  otc gerd rx but initiate diet now and bed blocks as well    F/u  6 weeks - call sooner if needed           Each maintenance medication was reviewed in detail including emphasizing most importantly the difference between maintenance and prns and under what circumstances the prns are to be triggered using an action plan format where appropriate.  Total time for H and P, chart review, counseling,  and generating customized AVS unique to this office visit / same day charting  > 30 min

## 2020-12-17 ENCOUNTER — Ambulatory Visit: Payer: BC Managed Care – PPO

## 2021-01-18 ENCOUNTER — Telehealth: Payer: Self-pay | Admitting: Internal Medicine

## 2021-01-18 DIAGNOSIS — R0600 Dyspnea, unspecified: Secondary | ICD-10-CM

## 2021-01-18 DIAGNOSIS — R0609 Other forms of dyspnea: Secondary | ICD-10-CM

## 2021-01-18 DIAGNOSIS — R079 Chest pain, unspecified: Secondary | ICD-10-CM

## 2021-01-18 NOTE — Telephone Encounter (Signed)
Called and spoke with Patient.  Patient stated at [place] she discussed with Dr. Melvyn Novas a possible cardiology referral if chest tightness continued.  Patient stated she started taking Symbicort 2 puffs twice daily as instructed. Patient stated she walks 3 miles a day, mows her yard, and uses emergency inhaler occasionally.  Patient stated chest tightness occurs when she is at rest.  Patient feels tightness may be covid related.  Patient stated she was told when she was much younger she had a mitral valve prolapse, but Dr. Melvyn Novas, and other doctors she has seen has never heard it. Patient request a cardiology referral be placed.  Message routed to Dr. Melvyn Novas to advise  LOV 12/13/20-  Instructions  Plan A = Automatic = Always=   Symbicort 160 Take 2 puffs first thing in am and then another 2 puffs about 12 hours later.    Plan B = Backup (to supplement plan A, not to replace it) Only use your albuterol inhaler as a rescue medication to be used if you can't catch your breath by resting or doing a relaxed purse lip breathing pattern.  - The less you use it, the better it will work when you need it. - Ok to use the inhaler up to 2 puffs  every 4 hours if you must but call for appointment if use goes up over your usual need - Don't leave home without it !!  (think of it like the spare tire for your car)   Try albuterol 15 min before an activity that you know would make you short of breath and see if it makes any difference and if makes none then don't take it after activity unless you can't catch your breath.   If you continue to have chest tightness that doesn't respond to this then next step is:  Try prilosec otc 20mg   Take 30-60 min before first meal of the day and Pepcid ac (famotidine) 20 mg after supper  until return.  GERD (REFLUX)  is an extremely common cause of respiratory symptoms just like yours , many times with no obvious heartburn at all.    It can be treated with medication, but also  with lifestyle changes including elevation of the head of your bed (ideally with 6 -8inch blocks under the headboard of your bed),  Smoking cessation, avoidance of late meals, excessive alcohol, and avoid fatty foods, chocolate, peppermint, colas, red wine, and acidic juices such as orange juice.  NO MINT OR MENTHOL PRODUCTS SO NO COUGH DROPS  USE SUGARLESS CANDY INSTEAD (Jolley ranchers or Stover's or Life Savers) or even ice chips will also do - the key is to swallow to prevent all throat clearing. NO OIL BASED VITAMINS - use powdered substitutes.  Avoid fish oil when coughing.     Please schedule a follow up office visit in 6 weeks, call sooner if needed

## 2021-01-18 NOTE — Telephone Encounter (Signed)
Referral made and I spoke with the pt and notified that this was done  Nothing further needed

## 2021-01-18 NOTE — Telephone Encounter (Signed)
Fine with me to refer to cardiology

## 2021-01-24 ENCOUNTER — Ambulatory Visit: Payer: BC Managed Care – PPO | Admitting: Internal Medicine

## 2021-03-20 ENCOUNTER — Ambulatory Visit: Payer: BC Managed Care – PPO | Admitting: Cardiology

## 2021-05-24 ENCOUNTER — Encounter: Payer: Self-pay | Admitting: Cardiovascular Disease

## 2021-05-24 ENCOUNTER — Other Ambulatory Visit: Payer: Self-pay

## 2021-05-24 ENCOUNTER — Ambulatory Visit: Payer: BC Managed Care – PPO | Admitting: Cardiovascular Disease

## 2021-05-24 DIAGNOSIS — R002 Palpitations: Secondary | ICD-10-CM | POA: Insufficient documentation

## 2021-05-24 NOTE — Progress Notes (Signed)
05/24/2021 Nicole Fleming   03-25-1961  [id number]  Primary Physician Patient, No Pcp Per (Inactive) Primary Cardiologist: Nicole Harp MD Nicole Fleming, [place]  HPI:  Nicole Fleming is a 60 y.o. 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  She was referred by Dr. Melvyn Fleming, her pulmonologist, because of palpitations.  She does have reactive airways disease.  She has no cardiac risk factors.  She is never had a heart attack or stroke.  She did have COVID back in December 2021 and several months thereafter started noticing frequent palpitations.  She drinks 1 coke a day but no other caffeine.   Current Meds  Medication Sig   albuterol (PROAIR HFA) 108 (90 Base) MCG/ACT inhaler 2 puffs every 4 hours as needed for short of breath, wheezing or chest tightness   budesonide-formoterol (SYMBICORT) 160-4.5 MCG/ACT inhaler Inhale 2 puffs into the lungs 2 (two) times daily.   Vitamin D, Ergocalciferol, (DRISDOL) 1.25 MG (50000 UT) CAPS capsule Take 1 capsule (50,000 Units total) by mouth every 7 (seven) days.     Allergies  Allergen Reactions   Amoxicillin Rash    Social History   Socioeconomic History   Marital status: Single    Spouse name: Not on file   Number of children: Not on file   Years of education: Not on file   Highest education level: Not on file  Occupational History   Not on file  Tobacco Use   Smoking status: Never   Smokeless tobacco: Never  Vaping Use   Vaping Use: Never used  Substance and Sexual Activity   Alcohol use: No   Drug use: No   Sexual activity: Not Currently    Partners: Male    Birth control/protection: Other-see comments    Comment: BTL   Other Topics Concern   Not on file  Social History Narrative   Not on file   Social Determinants of Health   Financial  Resource Strain: Not on file  Food Insecurity: Not on file  Transportation Needs: Not on file  Physical Activity: Not on file  Stress: Not on file  Social Connections: Not on file  Intimate Partner Violence: Not on file     Review of Systems: General: negative for chills, fever, night sweats or weight changes.  Cardiovascular: negative for chest pain, dyspnea on exertion, edema, orthopnea, palpitations, paroxysmal nocturnal dyspnea or shortness of breath Dermatological: negative for rash Respiratory: negative for cough or wheezing Urologic: negative for hematuria Abdominal: negative for nausea, vomiting, diarrhea, bright red blood per rectum, melena, or hematemesis Neurologic: negative for visual changes, syncope, or dizziness All other systems reviewed and are otherwise negative except as noted above.    Blood pressure 133/80, pulse 76, height 5\' 8"  (1.727 m), weight 165 lb (74.8 kg), last menstrual period 03/05/2012, SpO2 100 %.  General appearance: alert and no distress Neck: no adenopathy, no carotid bruit, no JVD, supple, symmetrical, trachea midline, and thyroid not enlarged, symmetric, no tenderness/mass/nodules Lungs: clear to auscultation bilaterally Heart: regular rate and rhythm, S1, S2 normal, no murmur, click, rub or gallop Extremities: extremities normal, atraumatic, no cyanosis or edema Pulses: 2+ and symmetric Skin: Skin color, texture, turgor normal. No rashes or lesions Neurologic: Grossly normal  EKG sinus rhythm at 76 without ST or T wave changes.  I personally reviewed this  EKG.  ASSESSMENT AND PLAN:   Palpitations [name] was referred by Dr. Melvyn Fleming for palpitations.  She basically has no cardiac risk factors.  She is never smoked.  There is no family history.  She is never had a heart attack or stroke.  She does have reactive airways disease on rescue inhalers.  She contracted COVID back in December of last year which only manifested as mild upper  respiratory tract symptoms.  She started noticing palpitations several months there after.  Palpitations occur fairly frequently both at rest and with exertion.  She occasionally gets dizzy.  I am going to get a 2D echo and a 2-week Zio patch to further evaluate     Nicole Harp MD Nicole Fleming - [place], Nicole Fleming 05/24/2021 2:32 PM

## 2021-05-24 NOTE — Assessment & Plan Note (Signed)
Nicole Fleming was referred by Dr. Melvyn Novas for palpitations.  She basically has no cardiac risk factors.  She is never smoked.  There is no family history.  She is never had a heart attack or stroke.  She does have reactive airways disease on rescue inhalers.  She contracted COVID back in December of last year which only manifested as mild upper respiratory tract symptoms.  She started noticing palpitations several months there after.  Palpitations occur fairly frequently both at rest and with exertion.  She occasionally gets dizzy.  I am going to get a 2D echo and a 2-week Zio patch to further evaluate

## 2021-05-24 NOTE — Patient Instructions (Signed)
Medication Instructions:  Your physician recommends that you continue on your current medications as directed. Please refer to the Current Medication list given to you today.  *If you need a refill on your cardiac medications before your next appointment, please call your pharmacy*   Lab Work: Your physician recommends that you return for lab work in: next week or 2 for Fasting lipid/liver panel.  If you have labs (blood work) drawn today and your tests are completely normal, you will receive your results only by: [place] (if you have MyChart) OR A paper copy in the mail If you have any lab test that is abnormal or we need to change your treatment, we will call you to review the results.   Testing/Procedures: Your physician has requested that you have an echocardiogram. Echocardiography is a painless test that uses sound waves to create images of your heart. It provides your doctor with information about the size and shape of your heart and how well your heart's chambers and valves are working. This procedure takes approximately one hour. There are no restrictions for this procedure. This procedure is done at [address]  Dr. Gwenlyn Found has ordered a CT coronary calcium score. This test is done at [address]. This is $99 out of pocket.   Coronary CalciumScan A coronary calcium scan is an imaging test used to look for deposits of calcium and other fatty materials (plaques) in the inner lining of the blood vessels of the heart (coronary arteries). These deposits of calcium and plaques can partly clog and narrow the coronary arteries without producing any symptoms or warning signs. This puts a person at risk for a heart attack. This test can detect these deposits before symptoms develop. Tell a health care provider about: Any allergies you have. All medicines you are taking, including vitamins, herbs, eye drops, creams, and over-the-counter medicines. Any  problems you or family members have had with anesthetic medicines. Any blood disorders you have. Any surgeries you have had. Any medical conditions you have. Whether you are pregnant or may be pregnant. What are the risks? Generally, this is a safe procedure. However, problems may occur, including: Harm to a pregnant woman and her unborn baby. This test involves the use of radiation. Radiation exposure can be dangerous to a pregnant woman and her unborn baby. If you are pregnant, you generally should not have this procedure done. Slight increase in the risk of cancer. This is because of the radiation involved in the test. What happens before the procedure? No preparation is needed for this procedure. What happens during the procedure? You will undress and remove any jewelry around your neck or chest. You will put on a hospital gown. Sticky electrodes will be placed on your chest. The electrodes will be connected to an electrocardiogram (ECG) machine to record a tracing of the electrical activity of your heart. A CT scanner will take pictures of your heart. During this time, you will be asked to lie still and hold your breath for 2-3 seconds while a picture of your heart is being taken. The procedure may vary among health care providers and hospitals. What happens after the procedure? You can get dressed. You can return to your normal activities. It is up to you to get the results of your test. Ask your health care provider, or the department that is doing the test, when your results will be ready. Summary A coronary calcium scan is an imaging test  used to look for deposits of calcium and other fatty materials (plaques) in the inner lining of the blood vessels of the heart (coronary arteries). Generally, this is a safe procedure. Tell your health care provider if you are pregnant or may be pregnant. No preparation is needed for this procedure. A CT scanner will take pictures of your  heart. You can return to your normal activities after the scan is done. This information is not intended to replace advice given to you by your health care provider. Make sure you discuss any questions you have with your health care provider. Document Released: 01/31/2008 Document Revised: 06/23/2016 Document Reviewed: 06/23/2016 Elsevier Interactive Patient Education  2017 [place] Term Monitor Instructions  Your physician has requested you wear a ZIO patch monitor for 14 days.  This is a single patch monitor. [employer] supplies one patch monitor per enrollment. Additional stickers are not available. Please do not apply patch if you will be having a Nuclear Stress Test,  Echocardiogram, Cardiac CT, MRI, or Chest Xray during the period you would be wearing the  monitor. The patch cannot be worn during these tests. You cannot remove and re-apply the  ZIO XT patch monitor.  Your ZIO patch monitor will be mailed 3 day USPS to your address on file. It may take 3-5 days  to receive your monitor after you have been enrolled.  Once you have received your monitor, please review the enclosed instructions. Your monitor  has already been registered assigning a specific monitor serial # to you.  Billing and Patient Assistance Program Information  We have supplied [employer] with any of your insurance information on file for billing purposes. [employer] offers a sliding scale Patient Assistance Program for patients that do not have  insurance, or whose insurance does not completely cover the cost of the ZIO monitor.  You must apply for the Patient Assistance Program to qualify for this discounted rate.  To apply, please call [employer] at [phone], select option 4, select option 2, ask to apply for  Patient Assistance Program. Theodore Demark will ask your household income, and how many people  are in your household. They will quote your out-of-pocket cost based on that information.  [employer] will  also be able to set up a 28-month, interest-free payment plan if needed.  Applying the monitor   Shave hair from upper left chest.  Hold abrader disc by orange tab. Rub abrader in 40 strokes over the upper left chest as  indicated in your monitor instructions.  Clean area with 4 enclosed alcohol pads. Let dry.  Apply patch as indicated in monitor instructions. Patch will be placed under collarbone on left  side of chest with arrow pointing upward.  Rub patch adhesive wings for 2 minutes. Remove white label marked "1". Remove the white  label marked "2". Rub patch adhesive wings for 2 additional minutes.  While looking in a mirror, press and release button in center of patch. A small green light will  flash 3-4 times. This will be your only indicator that the monitor has been turned on.   Do not shower for the first 24 hours. You may shower after the first 24 hours.   Press the button if you feel a symptom. You will hear a small click. Record Date, Time and  Symptom in the Patient Logbook.  When you are ready to remove the patch, follow instructions on the last 2 pages of Patient  Logbook. Stick patch monitor  onto the last page of Patient Logbook.  Place Patient Logbook in the blue and white box. Use locking tab on box and tape box closed  securely. The blue and white box has prepaid postage on it. Please place it in the mailbox as  soon as possible. Your physician should have your test results approximately 7 days after the  monitor has been mailed back to [facility].  Call [facility] at [phone] if you have questions regarding  your ZIO XT patch monitor. Call them immediately if you see an orange light blinking on your  monitor.  If your monitor falls off in less than 4 days, contact our Monitor department at [phone].  If your monitor becomes loose or falls off after 4 days call [employer] at [phone] for  suggestions on securing your  monitor   Follow-Up: At [facility], you and your health needs are our priority.  As part of our continuing mission to provide you with exceptional heart care, we have created designated Provider Care Teams.  These Care Teams include your primary Cardiologist (physician) and Advanced Practice Providers (APPs -  Physician Assistants and Nurse Practitioners) who all work together to provide you with the care you need, when you need it.  We recommend signing up for the patient portal called "MyChart".  Sign up information is provided on this After Visit Summary.  MyChart is used to connect with patients for Virtual Visits (Telemedicine).  Patients are able to view lab/test results, encounter notes, upcoming appointments, etc.  Non-urgent messages can be sent to your provider as well.   To learn more about what you can do with MyChart, go to [email or web address].    Your next appointment:   3 month(s)  The format for your next appointment:   In Person  Provider:   Quay Burow, MD

## 2021-05-28 ENCOUNTER — Ambulatory Visit (INDEPENDENT_AMBULATORY_CARE_PROVIDER_SITE_OTHER): Payer: BC Managed Care – PPO

## 2021-05-28 DIAGNOSIS — R002 Palpitations: Secondary | ICD-10-CM

## 2021-05-28 NOTE — Progress Notes (Unsigned)
Enrolled patient for a 14 day Zio XT  monitor to be mailed to patients home  °

## 2021-05-31 ENCOUNTER — Other Ambulatory Visit: Payer: Self-pay | Admitting: Obstetrics & Gynecology

## 2021-05-31 DIAGNOSIS — Z1231 Encounter for screening mammogram for malignant neoplasm of breast: Secondary | ICD-10-CM

## 2021-06-01 DIAGNOSIS — R002 Palpitations: Secondary | ICD-10-CM | POA: Diagnosis not present

## 2021-06-12 ENCOUNTER — Other Ambulatory Visit (HOSPITAL_COMMUNITY)
Admission: RE | Admit: 2021-06-12 | Discharge: 2021-06-12 | Disposition: A | Discharge status: Home / Self Care | Payer: BC Managed Care – PPO | Source: Ambulatory Visit | Attending: Obstetrics & Gynecology | Admitting: Obstetrics & Gynecology

## 2021-06-12 ENCOUNTER — Ambulatory Visit (INDEPENDENT_AMBULATORY_CARE_PROVIDER_SITE_OTHER): Payer: BC Managed Care – PPO | Admitting: Obstetrics & Gynecology

## 2021-06-12 ENCOUNTER — Encounter (HOSPITAL_BASED_OUTPATIENT_CLINIC_OR_DEPARTMENT_OTHER): Payer: Self-pay | Admitting: Obstetrics & Gynecology

## 2021-06-12 ENCOUNTER — Telehealth (HOSPITAL_BASED_OUTPATIENT_CLINIC_OR_DEPARTMENT_OTHER): Payer: Self-pay | Admitting: Obstetrics & Gynecology

## 2021-06-12 ENCOUNTER — Other Ambulatory Visit: Payer: Self-pay

## 2021-06-12 VITALS — BP 130/87 | HR 79 | Ht 67.5 in | Wt 162.6 lb

## 2021-06-12 DIAGNOSIS — Z01419 Encounter for gynecological examination (general) (routine) without abnormal findings: Secondary | ICD-10-CM

## 2021-06-12 DIAGNOSIS — B977 Papillomavirus as the cause of diseases classified elsewhere: Secondary | ICD-10-CM | POA: Diagnosis not present

## 2021-06-12 DIAGNOSIS — Z780 Asymptomatic menopausal state: Secondary | ICD-10-CM

## 2021-06-12 DIAGNOSIS — E2839 Other primary ovarian failure: Secondary | ICD-10-CM | POA: Diagnosis not present

## 2021-06-12 DIAGNOSIS — Z124 Encounter for screening for malignant neoplasm of cervix: Secondary | ICD-10-CM | POA: Diagnosis not present

## 2021-06-12 DIAGNOSIS — Z9889 Other specified postprocedural states: Secondary | ICD-10-CM

## 2021-06-12 MED ORDER — BENZONATATE 100 MG PO CAPS: 100.0000 mg | ORAL_CAPSULE | Freq: Three times a day (TID) | ORAL | 0 refills | Status: DC | PRN

## 2021-06-12 NOTE — Progress Notes (Signed)
60 y.o. G2P2 Single White or Caucasian female here for annual exam.  Doing well.  Just met someone.  He is retired from [employer].  He drives now for [employer].  She met him on a trip to [place] that she went on with her mom.  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  She is planning on retiring in about four years.    Denies vaginal bleeding.    Has been having some palpitations.  She is followed by Dr. Melvyn Novas and was sent to Dr. Alvester Chou.  She is wearing a heart monitor and does have a coronary CT scheduled 07/03/2021.  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  Pt feels like she has not had a palpitation since that time.  She wonders how much of her symptoms were stress/work related.    Patient's last menstrual period was 03/05/2012.          Sexually active: Yes.    The current method of family planning is tubal ligation.    Exercising: Yes.     walking Smoker:  no  Health Maintenance: Pap:  11/28/2019 Positive HPV History of abnormal Pap:  h/o HR HPV MMG:  01/10/2020 Negative, six month follow up scheduled Colonoscopy:   cologuard 12/2019 BMD:   2009 Screening Labs: 11/2019   reports that she has never smoked. She has never used smokeless tobacco. She reports that she does not drink alcohol and does not use drugs.  Past Medical History:  Diagnosis Date   Abnormal pap 2000   h/o LEEP   Asthma    Contact lens/glasses fitting    wears contacts or glasses   Depression    and anxiety   Hemorrhoids, external    IBS (irritable bowel syndrome)    questionable gluten allergy   Mitral valve prolapse 2006   echo 2013-no MVP-normal valve-no regurg   Schatzki's ring 1/07   of esophagus    Past Surgical History:  Procedure Laterality Date   [place]  12/2005   ESOPHAGEAL DILATION  1/07   HEMORRHOID SURGERY N/A 03/01/2013   Procedure: HEMORRHOIDECTOMY;   Surgeon: Imogene Burn. Georgette Dover, MD;  Location: [facility];  Service: General;  Laterality: N/A;   LEEP  1999   lipoma removal  10/29/2011   abdominal wall   SKIN BIOPSY Left 03/01/2013   Procedure: SKIN BIOPSY  X 2  ABDOMINAL WALL;  Surgeon: Imogene Burn. Tsuei, MD;  Location: [facility];  Service: General;  Laterality: Left;   TUBAL LIGATION      Current Outpatient Medications  Medication Sig Dispense Refill   benzonatate (TESSALON PERLES) 100 MG capsule Take 1 capsule (100 mg total) by mouth 3 (three) times daily as needed for cough. 30 capsule 0   albuterol (PROAIR HFA) 108 (90 Base) MCG/ACT inhaler 2 puffs every 4 hours as needed for short of breath, wheezing or chest tightness 18 g 1   budesonide-formoterol (SYMBICORT) 160-4.5 MCG/ACT inhaler Inhale 2 puffs into the lungs 2 (two) times daily. 1 each 11   Vitamin D, Ergocalciferol, (DRISDOL) 1.25 MG (50000 UT) CAPS capsule Take 1 capsule (50,000 Units total) by mouth every 7 (seven) days. 12 capsule 0   No current facility-administered medications for this visit.    Family History  Problem Relation Age of Onset   Heart disease Father 65  cardiac arrest/mitral valve replaced    Review of Systems  All other systems reviewed and are negative.  Exam:   BP 130/87 (BP Location: Right Arm, Patient Position: Sitting, Cuff Size: Normal)   Pulse 79   Ht 5' 7.5" (1.715 m)   Wt 162 lb 9.6 oz (73.8 kg)   LMP 03/05/2012   BMI 25.09 kg/m   Height: 5' 7.5" (171.5 cm)  General appearance: alert, cooperative and appears stated age Head: Normocephalic, without obvious abnormality, atraumatic Neck: no adenopathy, supple, symmetrical, trachea midline and thyroid normal to inspection and palpation Lungs: clear to auscultation bilaterally Breasts: normal appearance, no masses or tenderness Heart: regular rate and rhythm Abdomen: soft, non-tender; bowel sounds normal; no masses,  no organomegaly Extremities:  extremities normal, atraumatic, no cyanosis or edema Skin: Skin color, texture, turgor normal. No rashes or lesions Lymph nodes: Cervical, supraclavicular, and axillary nodes normal. No abnormal inguinal nodes palpated Neurologic: Grossly normal   Pelvic: External genitalia:  no lesions              Urethra:  normal appearing urethra with no masses, tenderness or lesions              Bartholins and Skenes: normal                 Vagina: normal appearing vagina with normal color and no discharge, no lesions              Cervix: no lesions              Pap taken: Yes.   Bimanual Exam:  Uterus:  normal size, contour, position, consistency, mobility, non-tender              Adnexa: normal adnexa and no mass, fullness, tenderness               Rectovaginal: Confirms               Anus:  normal sphincter tone, no lesions  Chaperone, Leda Min, CMA, was present for exam.  Assessment/Plan: 1. Well woman exam with routine gynecological exam - pap and HR HPV obtained today - cologuard neg 2021 - MMG scheduled - BMD order placed - plan lab work next year -   2. Postmenopausal - no HRT  3. Hypoestrogenism - DG BONE DENSITY (DXA); Future  4. High risk HPV infection  5. History of loop electrical excision procedure (LEEP)

## 2021-06-12 NOTE — Telephone Encounter (Signed)
Patient call stated she thinks she has a lump on her breast .Called patient back an left message to call the office back that I would have [place] work her in on the schedule today .

## 2021-06-20 ENCOUNTER — Other Ambulatory Visit (HOSPITAL_BASED_OUTPATIENT_CLINIC_OR_DEPARTMENT_OTHER): Payer: Self-pay | Admitting: Obstetrics & Gynecology

## 2021-06-20 DIAGNOSIS — E2839 Other primary ovarian failure: Secondary | ICD-10-CM

## 2021-06-21 LAB — CYTOLOGY - PAP
Comment: NEGATIVE
Diagnosis: NEGATIVE
Diagnosis: REACTIVE
High risk HPV: NEGATIVE

## 2021-07-02 ENCOUNTER — Ambulatory Visit (INDEPENDENT_AMBULATORY_CARE_PROVIDER_SITE_OTHER): Payer: BC Managed Care – PPO

## 2021-07-02 ENCOUNTER — Encounter: Payer: Self-pay | Admitting: Podiatry

## 2021-07-02 ENCOUNTER — Other Ambulatory Visit: Payer: Self-pay | Admitting: Podiatry

## 2021-07-02 ENCOUNTER — Other Ambulatory Visit: Payer: Self-pay

## 2021-07-02 ENCOUNTER — Ambulatory Visit: Payer: BC Managed Care – PPO | Admitting: Podiatry

## 2021-07-02 DIAGNOSIS — M778 Other enthesopathies, not elsewhere classified: Secondary | ICD-10-CM

## 2021-07-02 DIAGNOSIS — M79671 Pain in right foot: Secondary | ICD-10-CM

## 2021-07-02 DIAGNOSIS — M779 Enthesopathy, unspecified: Secondary | ICD-10-CM

## 2021-07-02 MED ORDER — MELOXICAM 15 MG PO TABS: 15.0000 mg | ORAL_TABLET | Freq: Every day | ORAL | 0 refills | Status: DC

## 2021-07-02 NOTE — Progress Notes (Signed)
  Subjective:  Patient ID: Nicole Fleming, female    DOB: 04-15-1961,   MRN: [id number]  Chief Complaint  Patient presents with   Foot Pain    I stepped wrong on the right foot on top of the toward the side and that was last sunday    60 y.o. female presents for pain in her right foot that has been present for about a week. Relates she may have stepped funny on the foot but does not recall a specific injury. Relates it has been improving but is still having some soreness on the top of her foot. Relates it hurts with certain shoes and when walking. Denies any treatment. She has previously had her left foot MTPJ fused with Dr. Jacqualyn Posey.  . Denies any other pedal complaints. Denies n/v/f/c.   Past Medical History:  Diagnosis Date   Abnormal pap 2000   h/o LEEP   Asthma    Contact lens/glasses fitting    wears contacts or glasses   Depression    and anxiety   Hemorrhoids, external    IBS (irritable bowel syndrome)    questionable gluten allergy   Mitral valve prolapse 2006   echo 2013-no MVP-normal valve-no regurg   Schatzki's ring 1/07   of esophagus    Objective:  Physical Exam: Vascular: DP/PT pulses 2/4 bilateral. CFT <3 seconds. Normal hair growth on digits. No edema.  Skin. No lacerations or abrasions bilateral feet.  Musculoskeletal: MMT 5/5 bilateral lower extremities in DF, PF, Inversion and Eversion. Deceased ROM in DF of ankle joint. Tender over dorsal cuboid area and along third and fourth metatarsals. Pain courses along extensor tendons. Pain with dorsiflexion and plantar flexion of digits 3 and 4. No pain with range of motion of the rays.  Neurological: Sensation intact to light touch.   Assessment:   1. Extensor tendonitis of foot   2. Right foot pain      Plan:  Patient was evaluated and treated and all questions answered. -Xrays reviewed. No acute fractures or dislocations noted.. Does have some joint spaced narrowing of lateral aspect of first MTPJ. Dorsal  spurring noted and cystic degenerative changes noted.  Discussed extensor tendonitis vs stress reaction with patient and treatment options.  Discussed NSAIDS, topicals and supportive shoes.  Prescription for meloxicam provided. Patient has surgical shoe at home. Recommend using shoe for next couple weeks to help calm down inflammation.  Patient to return as needed.     Lorenda Peck, DPM

## 2021-07-03 ENCOUNTER — Ambulatory Visit (INDEPENDENT_AMBULATORY_CARE_PROVIDER_SITE_OTHER)
Admission: RE | Admit: 2021-07-03 | Discharge: 2021-07-03 | Disposition: A | Discharge status: Home / Self Care | Payer: Self-pay | Source: Ambulatory Visit | Attending: Cardiovascular Disease | Admitting: Cardiovascular Disease

## 2021-07-03 ENCOUNTER — Ambulatory Visit (HOSPITAL_COMMUNITY): Payer: BC Managed Care – PPO | Attending: Cardiovascular Disease

## 2021-07-03 DIAGNOSIS — I361 Nonrheumatic tricuspid (valve) insufficiency: Secondary | ICD-10-CM | POA: Diagnosis not present

## 2021-07-03 DIAGNOSIS — R002 Palpitations: Secondary | ICD-10-CM | POA: Diagnosis not present

## 2021-07-03 LAB — ECHOCARDIOGRAM COMPLETE
Area-P 1/2: 3.72 cm2
S' Lateral: 2.6 cm

## 2021-07-08 ENCOUNTER — Ambulatory Visit
Admission: RE | Admit: 2021-07-08 | Discharge: 2021-07-08 | Disposition: A | Discharge status: Home / Self Care | Payer: BC Managed Care – PPO | Source: Ambulatory Visit | Attending: Obstetrics & Gynecology | Admitting: Obstetrics & Gynecology

## 2021-07-08 DIAGNOSIS — Z1231 Encounter for screening mammogram for malignant neoplasm of breast: Secondary | ICD-10-CM

## 2021-07-19 ENCOUNTER — Telehealth: Payer: Self-pay

## 2021-07-19 DIAGNOSIS — R002 Palpitations: Secondary | ICD-10-CM

## 2021-07-19 NOTE — Telephone Encounter (Signed)
Spoke with pt regarding calcium score and need for lipid profile per Dr. Gwenlyn Found. Pt plans to have these done in the next week. Orders placed. Pt verbalizes understanding.  Echo results provided to pt. All questions answered. Pt verbalizes understanding.

## 2021-07-19 NOTE — Telephone Encounter (Signed)
-----   Message from Lorretta Harp, MD sent at 07/04/2021  9:36 AM EST ----- Coronary calcium score of 35.  Last lipid profile was 1 year ago.  Please obtain a fasting lipid profile.

## 2021-07-24 ENCOUNTER — Other Ambulatory Visit: Payer: Self-pay

## 2021-07-24 DIAGNOSIS — R002 Palpitations: Secondary | ICD-10-CM

## 2021-07-24 LAB — HEPATIC FUNCTION PANEL
ALT: 12 IU/L (ref 0–32)
AST: 15 IU/L (ref 0–40)
Albumin: 4.3 g/dL (ref 3.8–4.9)
Alkaline Phosphatase: 98 IU/L (ref 44–121)
Bilirubin Total: 0.4 mg/dL (ref 0.0–1.2)
Bilirubin, Direct: 0.14 mg/dL (ref 0.00–0.40)
Total Protein: 6.7 g/dL (ref 6.0–8.5)

## 2021-07-24 LAB — LIPID PANEL
Chol/HDL Ratio: 4.4 ratio (ref 0.0–4.4)
Cholesterol, Total: 183 mg/dL (ref 100–199)
HDL: 42 mg/dL (ref >39)
LDL Chol Calc (NIH): 122 mg/dL — ABNORMAL HIGH (ref 0–99)
Triglycerides: 107 mg/dL (ref 0–149)
VLDL Cholesterol Cal: 19 mg/dL (ref 5–40)

## 2021-07-25 ENCOUNTER — Telehealth: Payer: Self-pay | Admitting: Podiatry

## 2021-07-25 NOTE — Telephone Encounter (Signed)
Mrs. Nicole Fleming was seen and is still having a lot of pain with stress fx on right foot. She just has some questions and would like to know if she needs to be sooner than the 6 week point. She called to speak with Lattie Haw since she was the Nurse that saw her that day.

## 2021-07-25 NOTE — Telephone Encounter (Signed)
Called patient back and the patient stated that she is still hurting and patient has not wore the boot like she should and over the thanksgiving holiday patient did not wear it for 3 days and hurts at night and I stated to wear the boot as much as patient can tolerate it that way we know if the boot is really helping and to call the office next week to get an appointment with Dr Blenda Mounts. Lattie Haw

## 2021-08-07 ENCOUNTER — Telehealth: Payer: Self-pay

## 2021-08-07 DIAGNOSIS — E785 Hyperlipidemia, unspecified: Secondary | ICD-10-CM

## 2021-08-07 NOTE — Telephone Encounter (Addendum)
-----   Message from Lorretta Harp, MD ----- LDL 122. Please get a CCS and send a heart health diet. Re check 3 months. LDL goal <100  Called pt to discuss lab results and recommendations per Dr. Gwenlyn Found. Pt is on board with plan to get coronary calcium score to risk stratify. Heart Healthy diet sent to pt via Mychart and orders placed for ca score. All questions answered and pt verbalizes understanding.

## 2021-08-30 ENCOUNTER — Ambulatory Visit: Payer: BC Managed Care – PPO | Admitting: Cardiovascular Disease

## 2021-09-10 ENCOUNTER — Other Ambulatory Visit: Payer: Self-pay

## 2021-09-10 ENCOUNTER — Ambulatory Visit (INDEPENDENT_AMBULATORY_CARE_PROVIDER_SITE_OTHER)
Admission: RE | Admit: 2021-09-10 | Discharge: 2021-09-10 | Disposition: A | Discharge status: Home / Self Care | Payer: Self-pay | Source: Ambulatory Visit | Attending: Cardiovascular Disease | Admitting: Cardiovascular Disease

## 2021-09-10 DIAGNOSIS — E785 Hyperlipidemia, unspecified: Secondary | ICD-10-CM

## 2021-09-17 ENCOUNTER — Encounter: Payer: Self-pay | Admitting: Cardiovascular Disease

## 2021-09-17 ENCOUNTER — Ambulatory Visit: Payer: BC Managed Care – PPO | Admitting: Cardiovascular Disease

## 2021-09-17 ENCOUNTER — Other Ambulatory Visit: Payer: Self-pay

## 2021-09-17 VITALS — BP 119/64 | HR 86 | Ht 68.0 in | Wt 165.4 lb

## 2021-09-17 DIAGNOSIS — K449 Diaphragmatic hernia without obstruction or gangrene: Secondary | ICD-10-CM

## 2021-09-17 DIAGNOSIS — E782 Mixed hyperlipidemia: Secondary | ICD-10-CM

## 2021-09-17 DIAGNOSIS — E785 Hyperlipidemia, unspecified: Secondary | ICD-10-CM | POA: Insufficient documentation

## 2021-09-17 DIAGNOSIS — R002 Palpitations: Secondary | ICD-10-CM | POA: Diagnosis not present

## 2021-09-17 DIAGNOSIS — R931 Abnormal findings on diagnostic imaging of heart and coronary circulation: Secondary | ICD-10-CM

## 2021-09-17 MED ORDER — ATORVASTATIN CALCIUM 20 MG PO TABS: 20.0000 mg | ORAL_TABLET | Freq: Every day | ORAL | 3 refills | Status: DC

## 2021-09-17 NOTE — Assessment & Plan Note (Signed)
History of palpitations with event monitor that showed occasional PACs and PVCs with short runs of SVT.  She has determined that this was all stress related since after a colleague left her place of work that was causing her some issues her symptoms completely resolved.

## 2021-09-17 NOTE — Patient Instructions (Addendum)
Medication Instructions:   -Start taking atorvastatin (lipitor) 20mg  once daily.  *If you need a refill on your cardiac medications before your next appointment, please call your pharmacy*   Lab Work: Your physician recommends that you return for lab work in: 3 months for FASTING lipid/liver profile.   If you have labs (blood work) drawn today and your tests are completely normal, you will receive your results only by: [place] (if you have MyChart) OR A paper copy in the mail If you have any lab test that is abnormal or we need to change your treatment, we will call you to review the results.   Follow-Up: At [facility], you and your health needs are our priority.  As part of our continuing mission to provide you with exceptional heart care, we have created designated Provider Care Teams.  These Care Teams include your primary Cardiologist (physician) and Advanced Practice Providers (APPs -  Physician Assistants and Nurse Practitioners) who all work together to provide you with the care you need, when you need it.  We recommend signing up for the patient portal called "MyChart".  Sign up information is provided on this After Visit Summary.  MyChart is used to connect with patients for Virtual Visits (Telemedicine).  Patients are able to view lab/test results, encounter notes, upcoming appointments, etc.  Non-urgent messages can be sent to your provider as well.   To learn more about what you can do with MyChart, go to [email or web address].    Your next appointment:   6 month(s)  The format for your next appointment:   In Person  Provider:  Quay Burow, MD   Other Instructions Heart-Healthy Eating Plan Heart-healthy meal planning includes: Eating less unhealthy fats. Eating more healthy fats. Making other changes in your diet. TWhat are tips for following this plan? Cooking Avoid frying your food. Try to bake, boil, grill, or broil it instead. You can also  reduce fat by: Removing the skin from poultry. Removing all visible fats from meats. Steaming vegetables in water or broth. Meal planning  At meals, divide your plate into four equal parts: Fill one-half of your plate with vegetables and green salads. Fill one-fourth of your plate with whole grains. Fill one-fourth of your plate with lean protein foods. Eat 4-5 servings of vegetables per day. A serving of vegetables is: 1 cup of raw or cooked vegetables. 2 cups of raw leafy greens. Eat 4-5 servings of fruit per day. A serving of fruit is: 1 medium whole fruit.  cup of dried fruit.  cup of fresh, frozen, or canned fruit.  cup of 100% fruit juice. Eat more foods that have soluble fiber. These are apples, broccoli, carrots, beans, peas, and barley. Try to get 20-30 g of fiber per day. Eat 4-5 servings of nuts, legumes, and seeds per week: 1 serving of dried beans or legumes equals  cup after being cooked. 1 serving of nuts is  cup. 1 serving of seeds equals 1 tablespoon. General information Eat more home-cooked food. Eat less restaurant, buffet, and fast food. Limit or avoid alcohol. Limit foods that are high in starch and sugar. Avoid fried foods. Lose weight if you are overweight. Keep track of how much salt (sodium) you eat. This is important if you have high blood pressure. Ask your doctor to tell you more about this. Try to add vegetarian meals each week. Fats Choose healthy fats. These include olive oil and canola oil, flaxseeds, walnuts, almonds, and seeds. Eat more omega-3  fats. These include salmon, mackerel, sardines, tuna, flaxseed oil, and ground flaxseeds. Try to eat fish at least 2 times each week. Check food labels. Avoid foods with trans fats or high amounts of saturated fat. Limit saturated fats. These are often found in animal products, such as meats, butter, and cream. These are also found in plant foods, such as palm oil, palm kernel oil, and coconut  oil. Avoid foods with partially hydrogenated oils in them. These have trans fats. Examples are stick margarine, some tub margarines, cookies, crackers, and other baked goods. What foods can I eat? Fruits All fresh, canned (in natural juice), or frozen fruits. Vegetables Fresh or frozen vegetables (raw, steamed, roasted, or grilled). Green salads. Grains Most grains. Choose whole wheat and whole grains most of the time. Rice and pasta, including brown rice and pastas made with whole wheat. Meats and other proteins Lean, well-trimmed beef, veal, pork, and lamb. Chicken and [place] without skin. All fish and shellfish. Wild duck, rabbit, pheasant, and venison. Egg whites or low-cholesterol egg substitutes. Dried beans, peas, lentils, and tofu. Seeds and most nuts. Dairy Low-fat or nonfat cheeses, including ricotta and mozzarella. Skim or 1% milk that is liquid, powdered, or evaporated. Buttermilk that is made with low-fat milk. Nonfat or low-fat yogurt. Fats and oils Non-hydrogenated (trans-free) margarines. Vegetable oils, including soybean, sesame, sunflower, olive, peanut, safflower, corn, canola, and cottonseed. Salad dressings or mayonnaise made with a vegetable oil. Beverages Mineral water. Coffee and tea. Diet carbonated beverages. Sweets and desserts Sherbet, gelatin, and fruit ice. Small amounts of dark chocolate. Limit all sweets and desserts. Seasonings and condiments All seasonings and condiments. The items listed above may not be a complete list of foods and drinks you can eat. Contact a dietitian for more options. What foods should I avoid? Fruits Canned fruit in heavy syrup. Fruit in cream or butter sauce. Fried fruit. Limit coconut. Vegetables Vegetables cooked in cheese, cream, or butter sauce. Fried vegetables. Grains Breads that are made with saturated or trans fats, oils, or whole milk. Croissants. Sweet rolls. Donuts. High-fat crackers, such as cheese crackers. Meats  and other proteins Fatty meats, such as hot dogs, ribs, sausage, bacon, rib-eye roast or steak. High-fat deli meats, such as salami and bologna. Caviar. Domestic duck and goose. Organ meats, such as liver. Dairy Cream, sour cream, cream cheese, and creamed cottage cheese. Whole-milk cheeses. Whole or 2% milk that is liquid, evaporated, or condensed. Whole buttermilk. Cream sauce or high-fat cheese sauce. Yogurt that is made from whole milk. Fats and oils Meat fat, or shortening. Cocoa butter, hydrogenated oils, palm oil, coconut oil, palm kernel oil. Solid fats and shortenings, including bacon fat, salt pork, lard, and butter. Nondairy cream substitutes. Salad dressings with cheese or sour cream. Beverages Regular sodas and juice drinks with added sugar. Sweets and desserts Frosting. Pudding. Cookies. Cakes. Pies. Milk chocolate or white chocolate. Buttered syrups. Full-fat ice cream or ice cream drinks. The items listed above may not be a complete list of foods and drinks to avoid. Contact a dietitian for more information. Summary Heart-healthy meal planning includes eating less unhealthy fats, eating more healthy fats, and making other changes in your diet. Eat a balanced diet. This includes fruits and vegetables, low-fat or nonfat dairy, lean protein, nuts and legumes, whole grains, and heart-healthy oils and fats. This information is not intended to replace advice given to you by your health care provider. Make sure you discuss any questions you have with your health care provider.  Document Revised: 12/13/2020 Document Reviewed: 12/13/2020 Elsevier Patient Education  2022 [employer].

## 2021-09-17 NOTE — Progress Notes (Signed)
09/17/2021 Nicole Fleming   [birth year]  [id number]  Primary Physician Megan Salon, MD Primary Cardiologist: Lorretta Harp MD Lupe Carney, [place]  HPI:  Nicole Fleming is a 61 y.o.   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  She was referred by Dr. Melvyn Novas, her pulmonologist, because of palpitations.  I last saw her in the office 05/24/2021.  She does have reactive airways disease.  She has no cardiac risk factors.  She is never had a heart attack or stroke.  She did have COVID back in December 2021 and several months thereafter started noticing frequent palpitations.  She drinks 1 coke a day but no other caffeine.  Since I saw her 3 months ago she did have an event monitor that showed occasional PACs, PVCs and short runs of SVT.  A 2D echo was essentially normal except for mild to moderate TR.  A coronary calcium score performed 09/10/2021 was 38 with calcium primarily in the LAD.  They did coincidentally found a large hiatal hernia which she may be mildly symptomatic from.     Current Meds  Medication Sig   albuterol (PROAIR HFA) 108 (90 Base) MCG/ACT inhaler 2 puffs every 4 hours as needed for short of breath, wheezing or chest tightness   benzonatate (TESSALON PERLES) 100 MG capsule Take 1 capsule (100 mg total) by mouth 3 (three) times daily as needed for cough.   budesonide-formoterol (SYMBICORT) 160-4.5 MCG/ACT inhaler Inhale 2 puffs into the lungs 2 (two) times daily.   meloxicam (MOBIC) 15 MG tablet Take 1 tablet (15 mg total) by mouth daily.   Vitamin D, Ergocalciferol, (DRISDOL) 1.25 MG (50000 UT) CAPS capsule Take 1 capsule (50,000 Units total) by mouth every 7 (seven) days.     Allergies  Allergen Reactions   Amoxicillin Rash    Social History   Socioeconomic History   Marital  status: Single    Spouse name: Not on file   Number of children: Not on file   Years of education: Not on file   Highest education level: Not on file  Occupational History   Not on file  Tobacco Use   Smoking status: Never   Smokeless tobacco: Never  Vaping Use   Vaping Use: Never used  Substance and Sexual Activity   Alcohol use: No   Drug use: No   Sexual activity: Not Currently    Partners: Male    Birth control/protection: Other-see comments    Comment: BTL   Other Topics Concern   Not on file  Social History Narrative   Not on file   Social Determinants of Health   Financial Resource Strain: Not on file  Food Insecurity: Not on file  Transportation Needs: Not on file  Physical Activity: Not on file  Stress: Not on file  Social Connections: Not on file  Intimate Partner Violence: Not on file     Review of Systems: General: negative for chills, fever, night sweats or weight changes.  Cardiovascular: negative for chest pain, dyspnea on exertion, edema, orthopnea, palpitations, paroxysmal nocturnal dyspnea or shortness of breath Dermatological: negative for rash Respiratory: negative for cough or wheezing Urologic: negative for hematuria Abdominal: negative for nausea, vomiting, diarrhea, bright red blood per rectum, melena, or hematemesis Neurologic: negative for visual changes, syncope, or dizziness All other  systems reviewed and are otherwise negative except as noted above.    Blood pressure 119/64, pulse 86, height 5\' 8"  (1.727 m), weight 165 lb 6.4 oz (75 kg), last menstrual period 03/05/2012, SpO2 99 %.  General appearance: alert and no distress Neck: no adenopathy, no carotid bruit, no JVD, supple, symmetrical, trachea midline, and thyroid not enlarged, symmetric, no tenderness/mass/nodules Lungs: clear to auscultation bilaterally Heart: regular rate and rhythm, S1, S2 normal, no murmur, click, rub or gallop Extremities: extremities normal, atraumatic, no  cyanosis or edema Pulses: 2+ and symmetric Skin: Skin color, texture, turgor normal. No rashes or lesions Neurologic: Grossly normal  EKG not performed today  ASSESSMENT AND PLAN:   Palpitations History of palpitations with event monitor that showed occasional PACs and PVCs with short runs of SVT.  She has determined that this was all stress related since after a colleague left her place of work that was causing her some issues her symptoms completely resolved.  Hyperlipidemia Recent lipid profile performed 07/24/2021 revealed total cholesterol 183, LDL 122 and HDL 42.  Because her coronary calcium score was 38 I am going to begin her on atorvastatin 20 mg a day with an LDL goal of less than 70.  We will recheck a lipid liver profile in 3 months.  Elevated coronary artery calcium score Coronary calcium score performed 09/10/2021 was 28.  All the calcium was in the LAD.  Because of this I have elected to begin her on atorvastatin to reduce her LDL to less than 70.  She did incidentally have a large hiatal hernia for which I am referring her to a gastroenterologist for evaluation.     Lorretta Harp MD FACP,FACC,FAHA, [facility] 09/17/2021 11:48 AM

## 2021-09-17 NOTE — Assessment & Plan Note (Signed)
Recent lipid profile performed 07/24/2021 revealed total cholesterol 183, LDL 122 and HDL 42.  Because her coronary calcium score was 38 I am going to begin her on atorvastatin 20 mg a day with an LDL goal of less than 70.  We will recheck a lipid liver profile in 3 months.

## 2021-09-17 NOTE — Assessment & Plan Note (Signed)
Coronary calcium score performed 09/10/2021 was 28.  All the calcium was in the LAD.  Because of this I have elected to begin her on atorvastatin to reduce her LDL to less than 70.  She did incidentally have a large hiatal hernia for which I am referring her to a gastroenterologist for evaluation.

## 2021-10-25 ENCOUNTER — Telehealth: Payer: Self-pay | Admitting: *Deleted

## 2021-10-25 NOTE — Telephone Encounter (Signed)
? ? ?  Name: Nicole Fleming  ?DOB: 1960/12/13  ?MRN: [id number] ? ?Primary Cardiologist: Quay Burow, MD ? ? ?Preoperative team, please contact this patient and set up a phone call appointment for further preoperative risk assessment. Please obtain consent and complete medication review. Thank you for your help. ? ? ?Ledora Bottcher, PA-C ?10/25/2021, 4:43 PM ?[phone] ?[facility] ?[address] ?[place], [place] [address] ? ? ?

## 2021-10-25 NOTE — Telephone Encounter (Signed)
Left message for the pt to call back to schedule a tele pre op appt. Left message to ask for the pre op team.  ?

## 2021-10-25 NOTE — Telephone Encounter (Signed)
? ?  Pre-operative Risk Assessment  ?  ?Patient Name: Nicole Fleming  ?DOB: 1961/06/13 ?MRN: [id number]  ? ?  ? ?Request for Surgical Clearance    ? ?Procedure:   EGD ? ?Date of Surgery:  Clearance 11/27/21                              ?   ?Surgeon:  DR Collene Mares ?Surgeon's Group or Practice Name:  [facility] ?Phone number:  [phone] ?Fax number:  [phone] ?  ?Type of Clearance Requested:   ?- Medical  ?  ?Type of Anesthesia:  PROPOFOL ?{ ? ?Signed, ?Devra Dopp   ?10/25/2021, 11:59 AM  ? ?

## 2021-10-28 ENCOUNTER — Telehealth: Payer: Self-pay | Admitting: *Deleted

## 2021-10-28 NOTE — Telephone Encounter (Signed)
Pt is agreeable to plan of care for tele pre op appt. 10/30/21 @ 11am, med rec and consent done ?

## 2021-10-28 NOTE — Telephone Encounter (Signed)
?  Patient Consent for Virtual Visit  ? ? ?   ? ?CIIN BRAZZEL has provided verbal consent on 10/28/2021 for a virtual visit (video or telephone). ? ? ?CONSENT FOR VIRTUAL VISIT FOR:  Nicole Fleming  ?By participating in this virtual visit I agree to the following: ? ?I hereby voluntarily request, consent and authorize [facility] and its employed or contracted physicians, physician assistants, nurse practitioners or other licensed health care professionals (the Practitioner), to provide me with telemedicine health care services (the ?Services") as deemed necessary by the treating Practitioner. I acknowledge and consent to receive the Services by the Practitioner via telemedicine. I understand that the telemedicine visit will involve communicating with the Practitioner through live audiovisual communication technology and the disclosure of certain medical information by electronic transmission. I acknowledge that I have been given the opportunity to request an in-person assessment or other available alternative prior to the telemedicine visit and am voluntarily participating in the telemedicine visit. ? ?I understand that I have the right to withhold or withdraw my consent to the use of telemedicine in the course of my care at any time, without affecting my right to future care or treatment, and that the Practitioner or I may terminate the telemedicine visit at any time. I understand that I have the right to inspect all information obtained and/or recorded in the course of the telemedicine visit and may receive copies of available information for a reasonable fee.  I understand that some of the potential risks of receiving the Services via telemedicine include:  ?Delay or interruption in medical evaluation due to technological equipment failure or disruption; ?Information transmitted may not be sufficient (e.g. poor resolution of images) to allow for appropriate medical decision making by the Practitioner; and/or   ?In rare instances, security protocols could fail, causing a breach of personal health information. ? ?Furthermore, I acknowledge that it is my responsibility to provide information about my medical history, conditions and care that is complete and accurate to the best of my ability. I acknowledge that Practitioner's advice, recommendations, and/or decision may be based on factors not within their control, such as incomplete or inaccurate data provided by me or distortions of diagnostic images or specimens that may result from electronic transmissions. I understand that the practice of medicine is not an exact science and that Practitioner makes no warranties or guarantees regarding treatment outcomes. I acknowledge that a copy of this consent can be made available to me via my patient portal ([facility]), or I can request a printed copy by calling the office of [facility].   ? ?I understand that my insurance will be billed for this visit.  ? ?I have read or had this consent read to me. ?I understand the contents of this consent, which adequately explains the benefits and risks of the Services being provided via telemedicine.  ?I have been provided ample opportunity to ask questions regarding this consent and the Services and have had my questions answered to my satisfaction. ?I give my informed consent for the services to be provided through the use of telemedicine in my medical care ? ? ? ?

## 2021-10-30 ENCOUNTER — Ambulatory Visit (INDEPENDENT_AMBULATORY_CARE_PROVIDER_SITE_OTHER): Payer: BC Managed Care – PPO | Admitting: Physician Assistant

## 2021-10-30 ENCOUNTER — Other Ambulatory Visit: Payer: Self-pay

## 2021-10-30 DIAGNOSIS — Z01810 Encounter for preprocedural cardiovascular examination: Secondary | ICD-10-CM

## 2021-10-30 NOTE — Progress Notes (Signed)
? ?Virtual Visit via Telephone Note  ? ?This visit type was conducted due to national recommendations for restrictions regarding the COVID-19 Pandemic (e.g. social distancing) in an effort to limit this patient's exposure and mitigate transmission in our community.  Due to her co-morbid illnesses, this patient is at least at moderate risk for complications without adequate follow up.  This format is felt to be most appropriate for this patient at this time.  The patient did not have access to video technology/had technical difficulties with video requiring transitioning to audio format only (telephone).  All issues noted in this document were discussed and addressed.  No physical exam could be performed with this format.  Please refer to the patient's chart for her  consent to telehealth for [facility]. ?Evaluation Performed:  Preoperative cardiovascular risk assessment ? ?This visit type was conducted due to national recommendations for restrictions regarding the COVID-19 Pandemic (e.g. social distancing).  This format is felt to be most appropriate for this patient at this time.  All issues noted in this document were discussed and addressed.  No physical exam was performed (except for noted visual exam findings with Video Visits).  Please refer to the patient's chart (MyChart message for video visits and phone note for telephone visits) for the patient's consent to telehealth for [facility]. ?_____________  ? ?Date:  10/30/2021  ? ?Patient ID:  Nicole Fleming, Nicole Fleming October 31, 1960, MRN [id number] ?Patient Location:  ?Home ?Provider location:   ?Office ? ?Primary Care Provider:  Megan Salon, MD ?Primary Cardiologist:  Quay Burow, MD ? ?Chief Complaint  ?  ?61 y.o. y/o female with a h/o palpitation and coronary artery calcification, who is pending upper EGD, and presents today for telephonic preoperative cardiovascular risk assessment. ? ?Past Medical History  ?  ?Past Medical History:  ?Diagnosis Date  ?  Abnormal pap 2000  ? h/o LEEP  ? Asthma   ? Contact lens/glasses fitting   ? wears contacts or glasses  ? Depression   ? and anxiety  ? Hemorrhoids, external   ? IBS (irritable bowel syndrome)   ? questionable gluten allergy  ? Mitral valve prolapse 2006  ? echo 2013-no MVP-normal valve-no regurg  ? Schatzki's ring 1/07  ? of esophagus  ? ?Past Surgical History:  ?Procedure Laterality Date  ? [place]  ? [place]  ? CHOLECYSTECTOMY  12/2005  ? ESOPHAGEAL DILATION  1/07  ? HEMORRHOID SURGERY N/A 03/01/2013  ? Procedure: HEMORRHOIDECTOMY;  Surgeon: Imogene Burn. Georgette Dover, MD;  Location: [place];  Service: General;  Laterality: N/A;  ? LEEP  1999  ? lipoma removal  10/29/2011  ? abdominal wall  ? SKIN BIOPSY Left 03/01/2013  ? Procedure: SKIN BIOPSY  X 2  ABDOMINAL WALL;  Surgeon: Imogene Burn. Georgette Dover, MD;  Location: [place];  Service: General;  Laterality: Left;  ? TUBAL LIGATION    ? ? ?Allergies ? ?Allergies  ?Allergen Reactions  ? Amoxicillin Rash  ? ? ?History of Present Illness  ?  ?Nicole Fleming is a 61 y.o. female who presents via audio/video conferencing for a telehealth visit today.  Pt was last seen in cardiology clinic on 09/17/2021, by Dr. Quay Burow.  At that time Nicole Fleming was doing well.  she is now pending upper endoscopy by GI service.  Since his last visit, she denies any exertional chest pain or worsening dyspnea.  If she can clearly accomplish more than  4 METS of activity. ? ? ?Home Medications  ?  ?Prior to Admission medications   ?Medication Sig Start Date End Date Taking? Authorizing Provider  ?albuterol (PROAIR HFA) 108 (90 Base) MCG/ACT inhaler 2 puffs every 4 hours as needed for short of breath, wheezing or chest tightness 07/08/19   Tanda Rockers, MD  ?atorvastatin (LIPITOR) 20 MG tablet Take 1 tablet (20 mg total) by mouth daily. ?Patient not taking: Reported on 10/28/2021 09/17/21 12/16/21  Lorretta Harp, MD  ?benzonatate  (TESSALON PERLES) 100 MG capsule Take 1 capsule (100 mg total) by mouth 3 (three) times daily as needed for cough. 06/12/21   Megan Salon, MD  ?budesonide-formoterol [facility]) 160-4.5 MCG/ACT inhaler Inhale 2 puffs into the lungs 2 (two) times daily. 12/13/20   Tanda Rockers, MD  ?meloxicam (MOBIC) 15 MG tablet Take 1 tablet (15 mg total) by mouth daily. ?Patient taking differently: Take 15 mg by mouth daily as needed. 07/02/21   Lorenda Peck, MD  ?Vitamin D, Ergocalciferol, (DRISDOL) 1.25 MG (50000 UT) CAPS capsule Take 1 capsule (50,000 Units total) by mouth every 7 (seven) days. 07/14/18   Megan Salon, MD  ? ? ?Physical Exam  ?  ?Vital Signs:  Nicole Fleming does not have vital signs available for review today. ? ?Given telephonic nature of communication, physical exam is limited. ?AAOx3. NAD. Normal affect.  Speech and respirations are unlabored. ? ?Accessory Clinical Findings  ?  ?None ? ?Assessment & Plan  ?  ?1.  Preoperative Cardiovascular Risk Assessment: ? -Patient has recent coronary calcium score which placed her at 81st percentile for age and sex matched control.  She has no prior history of CAD or stroke.  She has a history of palpitation however she has not had any prolonged palpitations since she saw Dr. Gwenlyn Found.  Dr. Gwenlyn Found referred her to GI service will recommended a EGD.  She is cleared from the cardiac perspective to proceed without further work-up for the low risk procedure. ? ? ?COVID-19 Education: ?The signs and symptoms of COVID-19 were discussed with the patient and how to seek care for testing (follow up with PCP or arrange E-visit).  The importance of social distancing was discussed today. ? ?Patient Risk:   ?After full review of this patient's history and clinical status, I feel that he is at least moderate risk for cardiac complications at this time, thus necessitating a telehealth visit sooner than our first available in office visit. ? ?Time:   ?I have done no charge for this  patient as patient was just seen by my attending who referred the patient to GI service.  She does not have any significant cardiac history. ? ? ?Almyra Deforest, [place] ? ?10/30/2021, 11:12 AM ? ?

## 2021-11-22 ENCOUNTER — Other Ambulatory Visit: Payer: BC Managed Care – PPO

## 2021-12-04 ENCOUNTER — Other Ambulatory Visit (HOSPITAL_BASED_OUTPATIENT_CLINIC_OR_DEPARTMENT_OTHER): Payer: Self-pay | Admitting: Obstetrics & Gynecology

## 2021-12-04 DIAGNOSIS — R718 Other abnormality of red blood cells: Secondary | ICD-10-CM

## 2021-12-06 ENCOUNTER — Encounter (HOSPITAL_BASED_OUTPATIENT_CLINIC_OR_DEPARTMENT_OTHER): Payer: Self-pay | Admitting: *Deleted

## 2021-12-09 ENCOUNTER — Other Ambulatory Visit (HOSPITAL_BASED_OUTPATIENT_CLINIC_OR_DEPARTMENT_OTHER): Payer: BC Managed Care – PPO

## 2021-12-09 DIAGNOSIS — R718 Other abnormality of red blood cells: Secondary | ICD-10-CM

## 2021-12-10 ENCOUNTER — Other Ambulatory Visit (HOSPITAL_BASED_OUTPATIENT_CLINIC_OR_DEPARTMENT_OTHER): Payer: Self-pay | Admitting: Obstetrics & Gynecology

## 2021-12-10 ENCOUNTER — Encounter (HOSPITAL_BASED_OUTPATIENT_CLINIC_OR_DEPARTMENT_OTHER): Payer: Self-pay | Admitting: Obstetrics & Gynecology

## 2021-12-10 DIAGNOSIS — K449 Diaphragmatic hernia without obstruction or gangrene: Secondary | ICD-10-CM

## 2021-12-10 LAB — IRON,TIBC AND FERRITIN PANEL
Ferritin: 16 ng/mL (ref 15–150)
Iron Saturation: 15 % (ref 15–55)
Iron: 61 ug/dL (ref 27–159)
Total Iron Binding Capacity: 402 ug/dL (ref 250–450)
UIBC: 341 ug/dL (ref 131–425)

## 2021-12-17 ENCOUNTER — Encounter: Payer: Self-pay | Admitting: Gastroenterology

## 2021-12-26 ENCOUNTER — Encounter: Payer: Self-pay | Admitting: Cardiovascular Disease

## 2021-12-31 ENCOUNTER — Telehealth: Payer: Self-pay | Admitting: Gastroenterology

## 2021-12-31 ENCOUNTER — Ambulatory Visit: Payer: BC Managed Care – PPO | Admitting: Physician Assistant

## 2021-12-31 NOTE — Telephone Encounter (Signed)
Hi Dr. Tarri Glenn, ? ? ?Patient called requesting a transfer of care over to you states Dr. Collene Mares is retiring and she did not have an EGD recently. I requested records from previous GI for review currently pending. ? ?

## 2022-01-08 ENCOUNTER — Ambulatory Visit: Payer: BC Managed Care – PPO | Admitting: Gastroenterology

## 2022-01-15 ENCOUNTER — Encounter (HOSPITAL_BASED_OUTPATIENT_CLINIC_OR_DEPARTMENT_OTHER): Payer: Self-pay | Admitting: Nurse Practitioner

## 2022-01-15 ENCOUNTER — Ambulatory Visit (HOSPITAL_BASED_OUTPATIENT_CLINIC_OR_DEPARTMENT_OTHER): Payer: BC Managed Care – PPO | Admitting: Nurse Practitioner

## 2022-01-15 VITALS — BP 117/72 | HR 78 | Ht 68.0 in | Wt 166.2 lb

## 2022-01-15 DIAGNOSIS — R931 Abnormal findings on diagnostic imaging of heart and coronary circulation: Secondary | ICD-10-CM

## 2022-01-15 DIAGNOSIS — E782 Mixed hyperlipidemia: Secondary | ICD-10-CM | POA: Diagnosis not present

## 2022-01-15 DIAGNOSIS — K449 Diaphragmatic hernia without obstruction or gangrene: Secondary | ICD-10-CM

## 2022-01-15 DIAGNOSIS — Z0000 Encounter for general adult medical examination without abnormal findings: Secondary | ICD-10-CM

## 2022-01-15 DIAGNOSIS — Z789 Other specified health status: Secondary | ICD-10-CM

## 2022-01-15 DIAGNOSIS — Z1329 Encounter for screening for other suspected endocrine disorder: Secondary | ICD-10-CM

## 2022-01-15 DIAGNOSIS — Z1321 Encounter for screening for nutritional disorder: Secondary | ICD-10-CM

## 2022-01-15 DIAGNOSIS — E559 Vitamin D deficiency, unspecified: Secondary | ICD-10-CM

## 2022-01-15 DIAGNOSIS — Z13228 Encounter for screening for other metabolic disorders: Secondary | ICD-10-CM

## 2022-01-15 DIAGNOSIS — Z130 Encounter for screening for diseases of the blood and blood-forming organs and certain disorders involving the immune mechanism: Secondary | ICD-10-CM

## 2022-01-15 DIAGNOSIS — I341 Nonrheumatic mitral (valve) prolapse: Secondary | ICD-10-CM

## 2022-01-15 DIAGNOSIS — Z8719 Personal history of other diseases of the digestive system: Secondary | ICD-10-CM | POA: Insufficient documentation

## 2022-01-15 NOTE — Assessment & Plan Note (Signed)
Recent incidental finding with CT. Symptoms are present. She is managing symptoms with dietary changes and portion control. She is scheduled to see GI in the near future. Encouraged patient to continue f/u with GI for monitoring. Will follow for recommendations.

## 2022-01-15 NOTE — Patient Instructions (Addendum)
Thank you for choosing [facility] for your Primary Care needs. I am excited for the opportunity to partner with you to meet your health care goals. It was a pleasure meeting you today!  Recommendations from today's visit: Tips to lower your cholesterol Focus on reducing the amount of saturated fats in your diet- these are your fats in animal products and pastries Increase the amount of healthy fats in your diet - these are found in whole grains, beans, nuts, vegetables, avocado oil, olive oil.  Information on diet, exercise, and health maintenance recommendations are listed below. This is information to help you be sure you are on track for optimal health and monitoring.   Please look over this and let us know if you have any questions or if you have completed any of the health maintenance outside of [place] so that we can be sure your records are up to date.  ___________________________________________________________ About Me: I am an Adult-Geriatric Nurse Practitioner with a background in caring for patients for more than 20 years with a strong intensive care background. I provide primary care and sports medicine services to patients age 73 and older within this office. My education had a strong focus on caring for the older adult population, which I am passionate about. I am also the director of the APP Fellowship with [facility].   My desire is to provide you with the best service through preventive medicine and supportive care. I consider you a part of the medical team and value your input. I work diligently to ensure that you are heard and your needs are met in a safe and effective manner. I want you to feel comfortable with me as your provider and want you to know that your health concerns are important to me.  For your information, our office hours are: Monday, Tuesday, and Thursday 8:00 AM - 5:00 PM Wednesday and Friday 8:00 AM - 12:00 PM.   In  my time away from the office I am teaching new APP's within the system and am unavailable, but my partner, Dr. Burnard Bunting is in the office for emergent needs.   If you have questions or concerns, please call our office at [phone] or send [place] a MyChart message and we will respond as quickly as possible.  ____________________________________________________________ MyChart:  For all urgent or time sensitive needs we ask that you please call the office to avoid delays. Our number is [phone]. MyChart is not constantly monitored and due to the large volume of messages a day, replies may take up to 72 business hours.  MyChart Policy: MyChart allows for you to see your visit notes, after visit summary, provider recommendations, lab and tests results, make an appointment, request refills, and contact your provider or the office for non-urgent questions or concerns. Providers are seeing patients during normal business hours and do not have built in time to review MyChart messages.  We ask that you allow a minimum of 3 business days for responses to [employer]. For this reason, please do not send urgent requests through [place]. Please call the office at [phone]. New and ongoing conditions may require a visit. We have virtual and in person visit available for your convenience.  Complex MyChart concerns may require a visit. Your provider may request you schedule a virtual or in person visit to ensure we are providing the best care possible. MyChart messages sent after 11:00 AM on Friday will not be received by the  provider until Monday morning.    Lab and Test Results: You will receive your lab and test results on MyChart as soon as they are completed and results have been sent by the lab or testing facility. Due to this service, you will receive your results BEFORE your provider.  I review lab and tests results each morning prior to seeing patients. Some results require collaboration with  other providers to ensure you are receiving the most appropriate care. For this reason, we ask that you please allow a minimum of 3-5 business days from the time the ALL results have been received for your provider to receive and review lab and test results and contact you about these.  Most lab and test result comments from the provider will be sent through [place]. Your provider may recommend changes to the plan of care, follow-up visits, repeat testing, ask questions, or request an office visit to discuss these results. You may reply directly to this message or call the office at [phone] to provide information for the provider or set up an appointment. In some instances, you will be called with test results and recommendations. Please let us know if this is preferred and we will make note of this in your chart to provide this for you.    If you have not heard a response to your lab or test results in 5 business days from all results returning to [place], please call the office to let us know. We ask that you please avoid calling prior to this time unless there is an emergent concern. Due to high call volumes, this can delay the resulting process.  After Hours: For all non-emergency after hours needs, please call the office at [phone] and select the option to reach the on-call provider service. On-call services are shared between multiple [place] offices and therefore it will not be possible to speak directly with your provider. On-call providers may provide medical advice and recommendations, but are unable to provide refills for maintenance medications.  For all emergency or urgent medical needs after normal business hours, we recommend that you seek care at the closest Urgent Care or Emergency Department to ensure appropriate treatment in a timely manner.  [facility] [place] has a 24 hour emergency room located on the ground floor for your convenience.   Urgent Concerns  During the Business Day Providers are seeing patients from 8AM to [place] with a busy schedule and are most often not able to respond to non-urgent calls until the end of the day or the next business day. If you should have URGENT concerns during the day, please call and speak to the nurse or schedule a same day appointment so that we can address your concern without delay.   Thank you, again, for choosing me as your health care partner. I appreciate your trust and look forward to learning more about you.   Nicole Keeler, DNP, AGNP-c ___________________________________________________________  Health Maintenance Recommendations Screening Testing Mammogram Every 1 -2 years based on history and risk factors Starting at age 23 Pap Smear Ages 21-39 every 3 years Ages 62-65 every 5 years with HPV testing More frequent testing may be required based on results and history Colon Cancer Screening Every 1-10 years based on test performed, risk factors, and history Starting at age 8 Bone Density Screening Every 2-10 years based on history Starting at age 69 for women Recommendations for men differ based on medication usage, history, and risk factors AAA Screening One time  ultrasound Men 35-70 years old who have every smoked Lung Cancer Screening Low Dose Lung CT every 12 months Age 42-80 years with a 30 pack-year smoking history who still smoke or who have quit within the last 15 years  Screening Labs Routine  Labs: Complete Blood Count (CBC), Complete Metabolic Panel (CMP), Cholesterol (Lipid Panel) Every 6-12 months based on history and medications May be recommended more frequently based on current conditions or previous results Hemoglobin A1c Lab Every 3-12 months based on history and previous results Starting at age 74 or earlier with diagnosis of diabetes, high cholesterol, BMI >26, and/or risk factors Frequent monitoring for patients with diabetes to ensure blood sugar  control Thyroid Panel (TSH w/ T3 & T4) Every 6 months based on history, symptoms, and risk factors May be repeated more often if on medication HIV One time testing for all patients 73 and older May be repeated more frequently for patients with increased risk factors or exposure Hepatitis C One time testing for all patients 21 and older May be repeated more frequently for patients with increased risk factors or exposure Gonorrhea, Chlamydia Every 12 months for all sexually active persons 13-24 years Additional monitoring may be recommended for those who are considered high risk or who have symptoms PSA Men 50-85 years old with risk factors Additional screening may be recommended from age 77-69 based on risk factors, symptoms, and history  Vaccine Recommendations Tetanus Booster All adults every 10 years Flu Vaccine All patients 6 months and older every year COVID Vaccine All patients 12 years and older Initial dosing with booster May recommend additional booster based on age and health history HPV Vaccine 2 doses all patients age 7-26 Dosing may be considered for patients over 26 Shingles Vaccine (Shingrix) 2 doses all adults 37 years and older Pneumonia (Pneumovax 23) All adults 53 years and older May recommend earlier dosing based on health history Pneumonia (Prevnar 47) All adults 72 years and older Dosed 1 year after Pneumovax 23  Additional Screening, Testing, and Vaccinations may be recommended on an individualized basis based on family history, health history, risk factors, and/or exposure.  __________________________________________________________  Diet Recommendations for All Patients  I recommend that all patients maintain a diet low in saturated fats, carbohydrates, and cholesterol. While this can be challenging at first, it is not impossible and small changes can make big differences.  Things to try: Decreasing the amount of soda, sweet tea, and/or juice to  one or less per day and replace with water While water is always the first choice, if you do not like water you may consider adding a water additive without sugar to improve the taste other sugar free drinks Replace potatoes with a brightly colored vegetable at dinner Use healthy oils, such as canola oil or olive oil, instead of butter or hard margarine Limit your bread intake to two pieces or less a day Replace regular pasta with low carb pasta options Bake, broil, or grill foods instead of frying Monitor portion sizes  Eat smaller, more frequent meals throughout the day instead of large meals  An important thing to remember is, if you love foods that are not great for your health, you don't have to give them up completely. Instead, allow these foods to be a reward when you have done well. Allowing yourself to still have special treats every once in a while is a nice way to tell yourself thank you for working hard to keep yourself healthy.   Also remember  that every day is a new day. If you have a bad day and "fall off the wagon", you can still climb right back up and keep moving along on your journey!  We have resources available to help you!  Some websites that may be helpful include: www.http://carter.biz/  Www.VeryWellFit.com _____________________________________________________________  Activity Recommendations for All Patients  I recommend that all adults get at least 20 minutes of moderate physical activity that elevates your heart rate at least 5 days out of the week.  Some examples include: Walking or jogging at a pace that allows you to carry on a conversation Cycling (stationary bike or outdoors) Water aerobics Yoga Weight lifting Dancing If physical limitations prevent you from putting stress on your joints, exercise in a pool or seated in a chair are excellent options.  Do determine your MAXIMUM heart rate for activity: YOUR AGE - 220 = MAX HeartRate   Remember! Do not  push yourself too hard.  Start slowly and build up your pace, speed, weight, time in exercise, etc.  Allow your body to rest between exercise and get good sleep. You will need more water than normal when you are exerting yourself. Do not wait until you are thirsty to drink. Drink with a purpose of getting in at least 8, 8 ounce glasses of water a day plus more depending on how much you exercise and sweat.    If you begin to develop dizziness, chest pain, abdominal pain, jaw pain, shortness of breath, headache, vision changes, lightheadedness, or other concerning symptoms, stop the activity and allow your body to rest. If your symptoms are severe, seek emergency evaluation immediately. If your symptoms are concerning, but not severe, please let us know so that we can recommend further evaluation.

## 2022-01-15 NOTE — Assessment & Plan Note (Signed)
Coronary calcium score 28 in LAD. Goal lipids <70 for optimal control and cardiovascular protection.  Patient unable to tolerate atorvastatin due to side effects of fatigue, dry eye, and personality changes.  Resolution of symptoms with medication cessation.  Lengthy discussion today about the effects of elevated cholesterol and significance of elevation of coronary calcium score. Discussion on increased risk of cardiovascular event and strong recommendation for medication and diet management of lipids.  Review of ways to increase HDL and reduce LDL through diet and exercise today.  Will continue to collaborate care with cardiology.

## 2022-01-15 NOTE — Assessment & Plan Note (Addendum)
Lengthy discussion on effects of HLD and risks associated with elevated numbers. At this time her numbers are not severely elevated, but in the setting of elevated coronary calcium score, this does pose an additional risk. Recommend tight lipid control at this time to help protect against cardiovascular damage and event in the future.  Discussion on dietary changes to help lower lipid levels and importance of this to prevent cardiovascular damage. Patient unable to tolerate statin therapy. Will continue to monitor with diet and activity alone. Will continue to collaborate care with cardiology.

## 2022-01-15 NOTE — Progress Notes (Signed)
Nicole Render, DNP, AGNP-c [facility] [address]  [place],  [address] [phone]  New patient visit   Patient: Nicole Fleming   DOB: 1961-01-09   61 y.o. Female  MRN: [id number] Visit Date: 01/15/2022  Patient Care Team: Taesean Reth, Coralee Pesa, NP as PCP - General (Nurse Practitioner) Lorretta Harp, MD as PCP - Cardiology (Cardiology)  Today's Vitals   01/15/22 0841  BP: 117/72  Pulse: 78  SpO2: 98%  Weight: 166 lb 3.2 oz (75.4 kg)  Height: '5\' 8"'$  (1.727 m)   Body mass index is 25.27 kg/m.   Today's healthcare provider: Orma Render, NP   Chief Complaint  Patient presents with   New Patient (Initial Visit)    Patient to establish care referral Nicole Fleming Patient is fasting today  Concerned about hernia   Subjective    Nicole Fleming is a 61 y.o. female who presents today as a new patient to establish care.    Patient endorses the following concerns presently: Nicole Fleming tells me she will be traveling to [place] in November on a mission trip and will need specific vaccines.  She would like to know where she should go to get these specific vaccines. She does not have the list with her at this time, but will send the information to me through MyChart to review and give her guidance.    Hyperlipidemia - mildly elevated LDL with elevated Coronary Calcium Score in LAD- following with cardiology - she has previously been on atorvastatin but this caused personality changes and made her "evil" - she was also dealing with significant fatigue and her eyes were very dry - she has been off of the atorvastatin for about 2 weeks now - she would like to discuss ways she can best manage her lipids with diet  Hiatal Hernia - incidental finding on imaging - she will be seeing Nicole Fleming for and endoscopy for evaluation - she endorses tightness and globus sensation when swallowing foods that are not soft or cut into very small  bites.  - she is managing this well -she endorses mild epigastric tenderness  - no N/V/D  History reviewed and reveals the following: Past Medical History:  Diagnosis Date   Abnormal pap 2000   h/o LEEP   Asthma    Contact lens/glasses fitting    wears contacts or glasses   Depression    and anxiety   Hemorrhoids, external    IBS (irritable bowel syndrome)    questionable gluten allergy   Mitral valve prolapse 2006   echo 2013-no MVP-normal valve-no regurg   Schatzki's ring 1/07   of esophagus   Past Surgical History:  Procedure Laterality Date   [place]  12/2005   ESOPHAGEAL DILATION  1/07   HEMORRHOID SURGERY N/A 03/01/2013   Procedure: HEMORRHOIDECTOMY;  Surgeon: Imogene Burn. Georgette Dover, MD;  Location: [facility];  Service: General;  Laterality: N/A;   LEEP  1999   lipoma removal  10/29/2011   abdominal wall   SKIN BIOPSY Left 03/01/2013   Procedure: SKIN BIOPSY  X 2  ABDOMINAL WALL;  Surgeon: Imogene Burn. Georgette Dover, MD;  Location: [facility];  Service: General;  Laterality: Left;   TUBAL LIGATION     Family Status  Relation Name Status   Father  Deceased   Mother  Alive   Family History  Problem Relation Age of Onset   Heart disease Father 17       cardiac arrest/mitral valve replaced   Social History   Socioeconomic History   Marital status: Single    Spouse name: Not on file   Number of children: Not on file   Years of education: Not on file   Highest education level: Not on file  Occupational History   Not on file  Tobacco Use   Smoking status: Never   Smokeless tobacco: Never  Vaping Use   Vaping Use: Never used  Substance and Sexual Activity   Alcohol use: No   Drug use: No   Sexual activity: Not Currently    Partners: Male    Birth control/protection: Other-see comments    Comment: BTL   Other Topics Concern   Not on file  Social History Narrative   Not on file    Social Determinants of Health   Financial Resource Strain: Not on file  Food Insecurity: Not on file  Transportation Needs: Not on file  Physical Activity: Not on file  Stress: Not on file  Social Connections: Not on file   Outpatient Medications Prior to Visit  Medication Sig   albuterol (PROAIR HFA) 108 (90 Base) MCG/ACT inhaler 2 puffs every 4 hours as needed for short of breath, wheezing or chest tightness   benzonatate (TESSALON PERLES) 100 MG capsule Take 1 capsule (100 mg total) by mouth 3 (three) times daily as needed for cough.   budesonide-formoterol (SYMBICORT) 160-4.5 MCG/ACT inhaler Inhale 2 puffs into the lungs 2 (two) times daily.   meloxicam (MOBIC) 15 MG tablet Take 1 tablet (15 mg total) by mouth daily. (Patient taking differently: Take 15 mg by mouth daily as needed.)   [DISCONTINUED] atorvastatin (LIPITOR) 20 MG tablet Take 1 tablet (20 mg total) by mouth daily. (Patient not taking: Reported on 10/28/2021)   [DISCONTINUED] Vitamin D, Ergocalciferol, (DRISDOL) 1.25 MG (50000 UT) CAPS capsule Take 1 capsule (50,000 Units total) by mouth every 7 (seven) days. (Patient not taking: Reported on 01/15/2022)   No facility-administered medications prior to visit.   Allergies  Allergen Reactions   Amoxicillin Rash   Immunization History  Administered Date(s) Administered   PFIZER(Purple Top)SARS-COV-2 Vaccination 10/22/2019, 11/12/2019    Review of Systems All review of systems negative except what is listed in the HPI   Objective    BP 117/72   Pulse 78   Ht '5\' 8"'$  (1.727 m)   Wt 166 lb 3.2 oz (75.4 kg)   LMP 03/05/2012   SpO2 98%   BMI 25.27 kg/m  Physical Exam Vitals and nursing note reviewed.  Constitutional:      General: She is not in acute distress.    Appearance: Normal appearance.  Eyes:     Extraocular Movements: Extraocular movements intact.     Conjunctiva/sclera: Conjunctivae normal.     Pupils: Pupils are equal, round, and reactive to light.   Neck:     Vascular: No carotid bruit.  Cardiovascular:     Rate and Rhythm: Normal rate and regular rhythm.     Pulses: Normal pulses.     Heart sounds: Normal heart sounds. No murmur heard. Pulmonary:     Effort: Pulmonary effort is normal.     Breath sounds: Normal breath sounds. No wheezing.  Abdominal:     General: Bowel sounds are normal.     Palpations: Abdomen is soft.     Tenderness: There is abdominal tenderness. There is  no guarding or rebound.  Musculoskeletal:        General: Normal range of motion.     Cervical back: Normal range of motion.     Right lower leg: No edema.     Left lower leg: No edema.  Skin:    General: Skin is warm and dry.     Capillary Refill: Capillary refill takes less than 2 seconds.  Neurological:     General: No focal deficit present.     Mental Status: She is alert and oriented to person, place, and time.  Psychiatric:        Mood and Affect: Mood normal.        Behavior: Behavior normal.        Thought Content: Thought content normal.        Judgment: Judgment normal.    No results found for any visits on 01/15/22.  Assessment & Plan      Problem List Items Addressed This Visit     Mitral valve prolapse (Chronic)   Relevant Orders   CBC With Diff/Platelet   Comprehensive metabolic panel   Hemoglobin A1c   Lipid panel   Thyroid Panel With TSH   VITAMIN D 25 Hydroxy (Vit-D Deficiency, Fractures)   Hyperlipidemia    Lengthy discussion on effects of HLD and risks associated with elevated numbers. At this time her numbers are not severely elevated, but in the setting of elevated coronary calcium score, this does pose an additional risk. Recommend tight lipid control at this time to help protect against cardiovascular damage and event in the future.  Discussion on dietary changes to help lower lipid levels and importance of this to prevent cardiovascular damage. Patient unable to tolerate statin therapy. Will continue to monitor with  diet and activity alone. Will continue to collaborate care with cardiology.       Relevant Orders   CBC With Diff/Platelet   Comprehensive metabolic panel   Hemoglobin A1c   Lipid panel   Thyroid Panel With TSH   VITAMIN D 25 Hydroxy (Vit-D Deficiency, Fractures)   Elevated coronary artery calcium score    Coronary calcium score 28 in LAD. Goal lipids <70 for optimal control and cardiovascular protection.  Patient unable to tolerate atorvastatin due to side effects of fatigue, dry eye, and personality changes.  Resolution of symptoms with medication cessation.  Lengthy discussion today about the effects of elevated cholesterol and significance of elevation of coronary calcium score. Discussion on increased risk of cardiovascular event and strong recommendation for medication and diet management of lipids.  Review of ways to increase HDL and reduce LDL through diet and exercise today.  Will continue to collaborate care with cardiology.        Relevant Orders   CBC With Diff/Platelet   Comprehensive metabolic panel   Hemoglobin A1c   Lipid panel   Thyroid Panel With TSH   VITAMIN D 25 Hydroxy (Vit-D Deficiency, Fractures)   Statin intolerance   Hiatal hernia    Recent incidental finding with CT. Symptoms are present. She is managing symptoms with dietary changes and portion control. She is scheduled to see GI in the near future. Encouraged patient to continue f/u with GI for monitoring. Will follow for recommendations.        Relevant Orders   CBC With Diff/Platelet   Comprehensive metabolic panel   Hemoglobin A1c   Lipid panel   Thyroid Panel With TSH   VITAMIN D 25 Hydroxy (Vit-D Deficiency, Fractures)  Other Visit Diagnoses     Encounter for medical examination to establish care    -  Primary   Screening for endocrine, nutritional, metabolic and immunity disorder       Relevant Orders   CBC With Diff/Platelet   Comprehensive metabolic panel   Hemoglobin A1c   Lipid  panel   Thyroid Panel With TSH   VITAMIN D 25 Hydroxy (Vit-D Deficiency, Fractures)   Vitamin D deficiency       Relevant Orders   VITAMIN D 25 Hydroxy (Vit-D Deficiency, Fractures)        Return for based on labs.      Arcenio Mullaly, Coralee Pesa, NP, DNP, AGNP-C [facility] at [facility]

## 2022-01-16 ENCOUNTER — Encounter (HOSPITAL_BASED_OUTPATIENT_CLINIC_OR_DEPARTMENT_OTHER): Payer: Self-pay | Admitting: Nurse Practitioner

## 2022-01-16 LAB — CBC WITH DIFF/PLATELET
Basophils Absolute: 0 10*3/uL (ref 0.0–0.2)
Basos: 1 %
EOS (ABSOLUTE): 0.2 10*3/uL (ref 0.0–0.4)
Eos: 3 %
Hematocrit: 39.2 % (ref 34.0–46.6)
Hemoglobin: 12.2 g/dL (ref 11.1–15.9)
Immature Grans (Abs): 0 10*3/uL (ref 0.0–0.1)
Immature Granulocytes: 0 %
Lymphocytes Absolute: 1.5 10*3/uL (ref 0.7–3.1)
Lymphs: 27 %
MCH: 24.9 pg — ABNORMAL LOW (ref 26.6–33.0)
MCHC: 31.1 g/dL — ABNORMAL LOW (ref 31.5–35.7)
MCV: 80 fL (ref 79–97)
Monocytes Absolute: 0.4 10*3/uL (ref 0.1–0.9)
Monocytes: 7 %
Neutrophils Absolute: 3.5 10*3/uL (ref 1.4–7.0)
Neutrophils: 62 %
Platelets: 202 10*3/uL (ref 150–450)
RBC: 4.9 x10E6/uL (ref 3.77–5.28)
RDW: 14.5 % (ref 11.7–15.4)
WBC: 5.6 10*3/uL (ref 3.4–10.8)

## 2022-01-16 LAB — COMPREHENSIVE METABOLIC PANEL
ALT: 13 IU/L (ref 0–32)
AST: 16 IU/L (ref 0–40)
Albumin/Globulin Ratio: 1.5 (ref 1.2–2.2)
Albumin: 4.5 g/dL (ref 3.8–4.9)
Alkaline Phosphatase: 103 IU/L (ref 44–121)
BUN/Creatinine Ratio: 17 (ref 12–28)
BUN: 13 mg/dL (ref 8–27)
Bilirubin Total: 0.6 mg/dL (ref 0.0–1.2)
CO2: 23 mmol/L (ref 20–29)
Calcium: 9.8 mg/dL (ref 8.7–10.3)
Chloride: 102 mmol/L (ref 96–106)
Creatinine, Ser: 0.76 mg/dL (ref 0.57–1.00)
Globulin, Total: 3 g/dL (ref 1.5–4.5)
Glucose: 93 mg/dL (ref 70–99)
Potassium: 4.3 mmol/L (ref 3.5–5.2)
Sodium: 140 mmol/L (ref 134–144)
Total Protein: 7.5 g/dL (ref 6.0–8.5)
eGFR: 90 mL/min/{1.73_m2} (ref >59)

## 2022-01-16 LAB — LIPID PANEL
Chol/HDL Ratio: 4.3 ratio (ref 0.0–4.4)
Cholesterol, Total: 188 mg/dL (ref 100–199)
HDL: 44 mg/dL (ref >39)
LDL Chol Calc (NIH): 123 mg/dL — ABNORMAL HIGH (ref 0–99)
Triglycerides: 119 mg/dL (ref 0–149)
VLDL Cholesterol Cal: 21 mg/dL (ref 5–40)

## 2022-01-16 LAB — HEMOGLOBIN A1C
Est. average glucose Bld gHb Est-mCnc: 117 mg/dL
Hgb A1c MFr Bld: 5.7 % — ABNORMAL HIGH (ref 4.8–5.6)

## 2022-01-16 LAB — THYROID PANEL WITH TSH
Free Thyroxine Index: 2.5 (ref 1.2–4.9)
T3 Uptake Ratio: 30 % (ref 24–39)
T4, Total: 8.2 ug/dL (ref 4.5–12.0)
TSH: 1.56 u[IU]/mL (ref 0.450–4.500)

## 2022-01-16 LAB — VITAMIN D 25 HYDROXY (VIT D DEFICIENCY, FRACTURES): Vit D, 25-Hydroxy: 51.3 ng/mL (ref 30.0–100.0)

## 2022-01-17 NOTE — Progress Notes (Signed)
Called pt and sch appt 

## 2022-01-17 NOTE — Telephone Encounter (Signed)
Called pt to sch f/u from review of labs.  Pt is wanting CMA to calling her regarding labs Please advise.

## 2022-01-27 ENCOUNTER — Ambulatory Visit (INDEPENDENT_AMBULATORY_CARE_PROVIDER_SITE_OTHER): Payer: BC Managed Care – PPO

## 2022-01-27 DIAGNOSIS — R350 Frequency of micturition: Secondary | ICD-10-CM | POA: Diagnosis not present

## 2022-01-27 LAB — POCT URINALYSIS DIPSTICK
Bilirubin, UA: NEGATIVE
Blood, UA: NEGATIVE
Glucose, UA: NEGATIVE
Ketones, UA: NEGATIVE
Nitrite, UA: NEGATIVE
Protein, UA: NEGATIVE
Spec Grav, UA: <=1.005 — AB (ref 1.010–1.025)
Urobilinogen, UA: 0.2 E.U./dL
pH, UA: 6.5 (ref 5.0–8.0)

## 2022-01-27 NOTE — Progress Notes (Signed)
Patient came in today to give a urine sample. Patient is complaining of urinary frequency, itching and burning. Urine was collected, tested and sent for a urine culture. tbw

## 2022-01-29 ENCOUNTER — Other Ambulatory Visit (HOSPITAL_BASED_OUTPATIENT_CLINIC_OR_DEPARTMENT_OTHER): Payer: Self-pay

## 2022-01-29 DIAGNOSIS — N898 Other specified noninflammatory disorders of vagina: Secondary | ICD-10-CM

## 2022-01-29 DIAGNOSIS — R350 Frequency of micturition: Secondary | ICD-10-CM

## 2022-01-29 MED ORDER — NITROFURANTOIN MONOHYD MACRO 100 MG PO CAPS: 100.0000 mg | ORAL_CAPSULE | Freq: Two times a day (BID) | ORAL | 0 refills | Status: DC

## 2022-01-29 MED ORDER — TERCONAZOLE 0.4 % VA CREA: 1.0000 | TOPICAL_CREAM | Freq: Every day | VAGINAL | 0 refills | Status: DC

## 2022-01-30 LAB — URINE CULTURE

## 2022-02-17 NOTE — Telephone Encounter (Signed)
Received a progress note from Dr. Lorie Apley office the patient drop off sent it for review.

## 2022-03-17 ENCOUNTER — Encounter (HOSPITAL_BASED_OUTPATIENT_CLINIC_OR_DEPARTMENT_OTHER): Payer: Self-pay | Admitting: Family Medicine

## 2022-03-17 ENCOUNTER — Ambulatory Visit (HOSPITAL_BASED_OUTPATIENT_CLINIC_OR_DEPARTMENT_OTHER): Payer: BC Managed Care – PPO | Admitting: Family Medicine

## 2022-03-17 DIAGNOSIS — S91339A Puncture wound without foreign body, unspecified foot, initial encounter: Secondary | ICD-10-CM | POA: Insufficient documentation

## 2022-03-17 DIAGNOSIS — Z23 Encounter for immunization: Secondary | ICD-10-CM | POA: Diagnosis not present

## 2022-03-17 NOTE — Assessment & Plan Note (Signed)
Patient presenting for evaluation of injuries to both feet.  She reports that she small laceration to her right foot by a nail.  She also indicates having a puncture wound to her left foot along plantar aspect.  This occurred a couple days ago.  She has some tenderness at the site, denies any systemic symptoms, no fever, chills, sweats.  Has been keeping the wounds clean and dry.  Does have questions about tetanus as she reports that it has been many years since her last tetanus shot. On exam, patient is in no acute distress, vital signs stable, afebrile.  Right foot with small subcentimeter superficial laceration along plantar aspect of foot between fourth and fifth toes.  Left foot with small puncture wound along plantar aspect near forefoot.  No significant surrounding erythema, no crepitus on palpation, mild tenderness to palpation, no drainage or weeping.  Normal DP and PT pulses bilaterally.  No swelling or edema in bilateral lower extremities. Given that it has been many years since her last tetanus booster, recommend completing this today Generally, wounds appear to be clean and healing well.  No signs of infection at this time.  Discussed potential warning signs for which patient should return to the office for further evaluation including increasing redness, pain, swelling, discharge, systemic symptoms.  If the symptoms should occur, would consider treatment with antibiotic therapy. Discussed general wound hygiene measures

## 2022-03-17 NOTE — Patient Instructions (Signed)
  Medication Instructions:  Your physician recommends that you continue on your current medications as directed. Please refer to the Current Medication list given to you today. --If you need a refill on any your medications before your next appointment, please call your pharmacy first. If no refills are authorized on file call the office.-- Lab Work: Your physician has recommended that you have lab work today: No If you have labs (blood work) drawn today and your tests are completely normal, you will receive your results via [facility] a phone call from our staff.  Please ensure you check your voicemail in the event that you authorized detailed messages to be left on a delegated number. If you have any lab test that is abnormal or we need to change your treatment, we will call you to review the results.  Referrals/Procedures/Imaging: No  Follow-Up: Your next appointment:   Your physician recommends that you schedule a follow-up appointment as needed with Jacolyn Reedy, NP.  You will receive a text message or e-mail with a link to a survey about your care and experience with [place] today! We would greatly appreciate your feedback!   Thanks for letting us be apart of your health journey!!  [facility] Care and [facility] Medicine   Dr. Arlina Robes [place]   We encourage you to activate your patient portal called "MyChart".  Sign up information is provided on this After Visit Summary.  MyChart is used to connect with patients for Virtual Visits (Telemedicine).  Patients are able to view lab/test results, encounter notes, upcoming appointments, etc.  Non-urgent messages can be sent to your provider as well. To learn more about what you can do with MyChart, please visit --  [email or web address].

## 2022-03-17 NOTE — Progress Notes (Signed)
    Procedures performed today:    None.  Independent interpretation of notes and tests performed by another provider:   None.  Brief History, Exam, Impression, and Recommendations:    BP 131/85   Pulse 70   Ht '5\' 8"'$  (1.727 m)   Wt 171 lb 12.8 oz (77.9 kg)   LMP 11/26/2011   SpO2 98%   BMI 26.12 kg/m   Puncture wound of foot Patient presenting for evaluation of injuries to both feet.  She reports that she small laceration to her right foot by a nail.  She also indicates having a puncture wound to her left foot along plantar aspect.  This occurred a couple days ago.  She has some tenderness at the site, denies any systemic symptoms, no fever, chills, sweats.  Has been keeping the wounds clean and dry.  Does have questions about tetanus as she reports that it has been many years since her last tetanus shot. On exam, patient is in no acute distress, vital signs stable, afebrile.  Right foot with small subcentimeter superficial laceration along plantar aspect of foot between fourth and fifth toes.  Left foot with small puncture wound along plantar aspect near forefoot.  No significant surrounding erythema, no crepitus on palpation, mild tenderness to palpation, no drainage or weeping.  Normal DP and PT pulses bilaterally.  No swelling or edema in bilateral lower extremities. Given that it has been many years since her last tetanus booster, recommend completing this today Generally, wounds appear to be clean and healing well.  No signs of infection at this time.  Discussed potential warning signs for which patient should return to the office for further evaluation including increasing redness, pain, swelling, discharge, systemic symptoms.  If the symptoms should occur, would consider treatment with antibiotic therapy. Discussed general wound hygiene measures  Return if symptoms worsen or fail to improve.   ___________________________________________ Kennia Vanvorst de [place], MD, ABFM,  CAQSM [facility]

## 2022-03-25 ENCOUNTER — Other Ambulatory Visit: Payer: BC Managed Care – PPO

## 2022-03-25 ENCOUNTER — Other Ambulatory Visit (HOSPITAL_BASED_OUTPATIENT_CLINIC_OR_DEPARTMENT_OTHER): Payer: Self-pay | Admitting: Obstetrics & Gynecology

## 2022-03-25 DIAGNOSIS — E2839 Other primary ovarian failure: Secondary | ICD-10-CM

## 2022-03-25 NOTE — Telephone Encounter (Signed)
Reviewed records from Dr. Collene Mares.  Last visit 3/03/24/22.  Previously seen for constipation, diverticulosis and anal fissure.  Large hiatal hernia seen on cardiac CT scan 09/10/2021. Epigastric pain and dysphagia for solids for 6 to 8 months She has epigastric pain after swallowing and frequent nausea. She has intolerance to gluten.  Colonoscopy 02/07/2015 revealed sigmoid diverticulosis with pseudopolyps.  Biopsies of the terminal ileum showed active inflammation with erosion.  Negative Cologuard 2 years ago.  Maternal grandmother had colon cancer in her early 50s.

## 2022-03-31 ENCOUNTER — Other Ambulatory Visit: Payer: Self-pay | Admitting: Internal Medicine

## 2022-03-31 ENCOUNTER — Telehealth: Payer: Self-pay | Admitting: Internal Medicine

## 2022-03-31 MED ORDER — BUDESONIDE-FORMOTEROL FUMARATE 160-4.5 MCG/ACT IN AERO: 2.0000 | INHALATION_SPRAY | Freq: Two times a day (BID) | RESPIRATORY_TRACT | 0 refills | Status: DC

## 2022-03-31 NOTE — Telephone Encounter (Signed)
ATC patient. LVMTCB. 

## 2022-03-31 NOTE — Telephone Encounter (Signed)
Called and spoke with patient. Advised patient she would need a f/u appointment in order to get refills. Got patient scheduled with Dr. Melvyn Novas. Patient stated that she's not completely out of her inhalers so she can wait for the refills until her appointment.   Nothing further needed.

## 2022-04-15 ENCOUNTER — Ambulatory Visit (INDEPENDENT_AMBULATORY_CARE_PROVIDER_SITE_OTHER): Payer: BC Managed Care – PPO

## 2022-04-15 ENCOUNTER — Telehealth (HOSPITAL_BASED_OUTPATIENT_CLINIC_OR_DEPARTMENT_OTHER): Payer: Self-pay | Admitting: *Deleted

## 2022-04-15 DIAGNOSIS — R350 Frequency of micturition: Secondary | ICD-10-CM | POA: Diagnosis not present

## 2022-04-15 LAB — POCT URINALYSIS DIPSTICK
Bilirubin, UA: NEGATIVE
Glucose, UA: NEGATIVE
Ketones, UA: NEGATIVE
Nitrite, UA: NEGATIVE
Protein, UA: NEGATIVE
Spec Grav, UA: 1.02 (ref 1.010–1.025)
Urobilinogen, UA: 0.2 E.U./dL
pH, UA: 6.5 (ref 5.0–8.0)

## 2022-04-15 NOTE — Telephone Encounter (Signed)
Pt called with reports of feeling like she has a UTI. Advised pt to come by the office so that we can send urine for culture. Pt provided with appt

## 2022-04-15 NOTE — Progress Notes (Signed)
Patient came in today to give a urine sample to be evaluated for UTI. Urine collected and sent off for urine culture. tbw

## 2022-04-16 ENCOUNTER — Encounter: Payer: Self-pay | Admitting: Gastroenterology

## 2022-04-16 ENCOUNTER — Ambulatory Visit: Payer: BC Managed Care – PPO | Admitting: Gastroenterology

## 2022-04-16 VITALS — BP 110/72 | HR 84 | Ht 68.0 in | Wt 171.0 lb

## 2022-04-16 DIAGNOSIS — K449 Diaphragmatic hernia without obstruction or gangrene: Secondary | ICD-10-CM | POA: Diagnosis not present

## 2022-04-16 DIAGNOSIS — R1310 Dysphagia, unspecified: Secondary | ICD-10-CM | POA: Diagnosis not present

## 2022-04-16 NOTE — Progress Notes (Unsigned)
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 Collene Mares 3026 Normal Cologuard 2021  PLAN: - Hernia brochure provided - EGD with possible dilation and biopsy - Discussed a trial of PPI which she declined because she doesn't feel that she has heartburn - May need surgery consultation based on endoscopic evaluation - Surveillance colonoscopy 2026  HPI: Nicole Fleming is a 61 y.o. female referred by Dr. Sabra Heck for a hiatal hernia. The history is obtained through the patient and review of her electronic health records.  Previously GI care with Dr. Watt Climes and Collene Mares. I have also reviewed records from Dr. Collene Mares.  Last visit with Dr. Collene Mares 3/03/24/22.  Previously seen for constipation, diverticulosis. She also had a hemorrhoidectomy in 2014 with Dr. Georgette Dover and has had a cholecystectomy.   Large hiatal hernia seen on cardiac CT scan 09/10/2021. Referred to discuss these findings. Has been having some epigastric pain and dysphagia for solids for 6 to 8 months. She feels like foods are getting stuck in the hernia. She has a sensation of "choking" around her xiphoid.  Occasionally feels a catch in her back and mild RUQ if she beds over that she thinks might be related.   No reflux, heartburn, nausea, odynophagia, dysphonia. No regurgitation, dysphagia, postprandial abdominal pain.  No history of gastric volvulus, GI bleeding, GI obstruction, strangulation, perforation, or respiratory compromise.    She will have diarrhea after eating [place]. But, she denies chronic diarrhea.  Intermittent challenges with perianal cleaning.   She has intolerance to gluten. Will develop severe abdominal pain within minutes of eating bread, particularly yeast rolls.    Colonoscopy 02/07/2015 revealed sigmoid diverticulosis with pseudopolyps.  Biopsies of the terminal ileum showed active inflammation with erosion.   Negative Cologuard 2 years ago.   There is no known family history of colon cancer or polyps. No family history of stomach cancer or other GI malignancy. No family history of inflammatory bowel disease or celiac.    Past Medical History:  Diagnosis Date   Abnormal pap 08/18/1998   h/o LEEP   Asthma    Contact lens/glasses fitting    wears contacts or glasses   Depression    and anxiety   Hemorrhoids, external    Hiatal hernia    IBS (irritable bowel syndrome)    questionable gluten allergy   Mitral valve prolapse 08/18/2004   echo 2013-no MVP-normal valve-no regurg   Schatzki's ring 08/18/2005   of esophagus    Past Surgical History:  Procedure Laterality Date   [place]   CHOLECYSTECTOMY  12/2005   ESOPHAGEAL DILATION  1/07   HEMORRHOID SURGERY N/A 03/01/2013   Procedure: HEMORRHOIDECTOMY;  Surgeon: Imogene Burn. Georgette Dover, MD;  Location: [facility];  Service: General;  Laterality: N/A;   LEEP  1999   lipoma removal  10/29/2011   abdominal wall   SKIN BIOPSY Left 03/01/2013   Procedure: SKIN BIOPSY  X 2  ABDOMINAL WALL;  Surgeon: Imogene Burn. Georgette Dover, MD;  Location: [facility];  Service: General;  Laterality: Left;   TUBAL LIGATION       Current Outpatient Medications  Medication Sig Dispense Refill   albuterol (PROAIR HFA) 108 (90 Base) MCG/ACT inhaler 2 puffs every 4 hours as needed for short of breath, wheezing or chest tightness 18 g 1   benzonatate (TESSALON PERLES) 100 MG capsule Take 1 capsule (100 mg total) by mouth 3 (three) times daily as needed for cough. 30 capsule 0    budesonide-formoterol (SYMBICORT) 160-4.5 MCG/ACT inhaler Inhale 2 puffs into the lungs 2 (two) times daily. 1 each 0   meloxicam (MOBIC) 15 MG tablet Take 1 tablet (15 mg total) by mouth daily. (Patient taking differently: Take 15 mg by mouth daily as needed.) 30 tablet 0   terconazole (TERAZOL 7) 0.4 % vaginal cream Place 1 applicator vaginally at bedtime. 45 g 0   No current facility-administered medications for this visit.    Allergies as of 04/16/2022 - Review Complete 04/16/2022  Allergen Reaction Noted   Amoxicillin Rash 06/24/2011    Family History  Problem Relation Age of Onset   Heart disease Father 16       cardiac arrest/mitral valve replaced    Social History   Socioeconomic History   Marital status: Single    Spouse name: Not on file   Number of children: 2   Years of education: Not on file   Highest education level: Not on file  Occupational History   Occupation: [employer]  Tobacco Use   Smoking status: Never   Smokeless tobacco: Never  Vaping Use   Vaping Use: Never used  Substance and Sexual Activity   Alcohol use: No   Drug use: No   Sexual activity: Not Currently    Partners: Male    Birth control/protection: Other-see comments    Comment: BTL   Other Topics Concern   Not on file  Social History Narrative   Not on file   Social Determinants of Health   Financial Resource Strain: Not on file  Food Insecurity: Not on file  Transportation Needs: Not on file  Physical Activity: Not on file  Stress: Not on file  Social Connections: Not on file  Intimate Partner Violence: Not on file    Review of Systems: 12 system ROS is negative except as noted above.   Physical Exam: General:   Alert,  well-nourished, pleasant and cooperative in NAD Head:  Normocephalic and atraumatic. Eyes:  Sclera clear, no icterus.   Conjunctiva pink. Ears:  Normal auditory acuity. Nose:  No deformity, discharge,  or lesions. Mouth:  No deformity or  lesions.   Neck:  Supple; no masses or thyromegaly. Lungs:  Clear throughout to auscultation.   No wheezes. Heart:  Regular rate and rhythm; no murmurs. Abdomen:  Soft, nontender, nondistended, normal bowel sounds, no rebound or guarding. No hepatosplenomegaly.   Rectal:  Deferred  Msk:  Symmetrical. No boney deformities LAD: No inguinal or umbilical LAD Extremities:  No clubbing or edema. Neurologic:  Alert and  oriented x4;  grossly nonfocal Skin:  Intact without significant lesions or rashes. Psych:  Alert and cooperative. Normal mood and affect.    Kathlen Sakurai L. Tarri Glenn, MD, MPH 04/17/2022, 12:18 PM

## 2022-04-16 NOTE — Patient Instructions (Addendum)
It was my pleasure to provide care to you today. Based on our discussion, I am providing you with my recommendations below:  RECOMMENDATION(S):   Upper endoscopy recommended.  Brochure about hiatal hernia offered. [email or web address].com is an additional source for accurate information about hernia.  We may ultimately want you to see a surgeon to discuss repair of the hernia.   FOLLOW UP:  After your procedure, you will receive a call from my office staff regarding my recommendation for follow up.  BMI:  If you are age 50 or older, your body mass index should be between 23-30. Your Body mass index is 26 kg/m. If this is out of the aforementioned range listed, please consider follow up with your Primary Care Provider.  If you are age 65 or younger, your body mass index should be between 19-25. Your Body mass index is 26 kg/m. If this is out of the aformentioned range listed, please consider follow up with your Primary Care Provider.   MY CHART:  The [facility] providers would like to encourage you to use [facility] to communicate with providers for non-urgent requests or questions.  Due to long hold times on the telephone, sending your provider a message by [facility] may be a faster and more efficient way to get a response.  Please allow 48 business hours for a response.  Please remember that this is for non-urgent requests.   Thank you for trusting me with your gastrointestinal care!    Thornton Park, MD, MPH

## 2022-04-17 ENCOUNTER — Encounter: Payer: Self-pay | Admitting: Gastroenterology

## 2022-04-19 LAB — URINE CULTURE

## 2022-04-20 ENCOUNTER — Other Ambulatory Visit (HOSPITAL_BASED_OUTPATIENT_CLINIC_OR_DEPARTMENT_OTHER): Payer: Self-pay | Admitting: Obstetrics & Gynecology

## 2022-04-20 DIAGNOSIS — N3000 Acute cystitis without hematuria: Secondary | ICD-10-CM

## 2022-04-20 MED ORDER — SULFAMETHOXAZOLE-TRIMETHOPRIM 800-160 MG PO TABS: 1.0000 | ORAL_TABLET | Freq: Two times a day (BID) | ORAL | 0 refills | Status: DC

## 2022-04-22 ENCOUNTER — Encounter: Payer: Self-pay | Admitting: Internal Medicine

## 2022-04-22 ENCOUNTER — Telehealth (HOSPITAL_BASED_OUTPATIENT_CLINIC_OR_DEPARTMENT_OTHER): Payer: Self-pay | Admitting: Obstetrics & Gynecology

## 2022-04-22 ENCOUNTER — Encounter: Payer: Self-pay | Admitting: Gastroenterology

## 2022-04-22 ENCOUNTER — Encounter (HOSPITAL_BASED_OUTPATIENT_CLINIC_OR_DEPARTMENT_OTHER): Payer: Self-pay | Admitting: Obstetrics & Gynecology

## 2022-04-22 ENCOUNTER — Ambulatory Visit: Payer: BC Managed Care – PPO | Admitting: Internal Medicine

## 2022-04-22 ENCOUNTER — Other Ambulatory Visit (HOSPITAL_BASED_OUTPATIENT_CLINIC_OR_DEPARTMENT_OTHER): Payer: Self-pay | Admitting: Obstetrics & Gynecology

## 2022-04-22 DIAGNOSIS — N3000 Acute cystitis without hematuria: Secondary | ICD-10-CM

## 2022-04-22 MED ORDER — PHENAZOPYRIDINE HCL 100 MG PO TABS: 100.0000 mg | ORAL_TABLET | Freq: Three times a day (TID) | ORAL | 0 refills | Status: DC | PRN

## 2022-04-22 MED ORDER — SULFAMETHOXAZOLE-TRIMETHOPRIM 800-160 MG PO TABS: 1.0000 | ORAL_TABLET | Freq: Two times a day (BID) | ORAL | 0 refills | Status: DC

## 2022-04-22 NOTE — Telephone Encounter (Signed)
Patient call and stated she left a message on Friday and need someone to please call her.

## 2022-04-22 NOTE — Telephone Encounter (Signed)
Duplicate message.      Closing encounter.

## 2022-04-22 NOTE — Progress Notes (Deleted)
Nicole Fleming, female    DOB: 09-20-1960    MRN: [id number]    Brief patient profile:  81   yowf never smoker with h/o allergies as child = itching/sneezing not wheezing took shots as a teenager  in [facility] and shots helped and everything was fine until around 2009 started having  breathing attacks in winter time >  to [facility] multiple times and rx with prn saba then eval by [facility] > dermatographia so ran RAST neg > never went back and reports since then using more and more albuterol and freq prednisone. Was prescribed advair but apparently never took it and referred to pulmonary clinic 07/20/2018 by Dr   Edwinna Areola.     History of Present Illness  07/20/2018  Pulmonary/ 1st office eval/Cindia Hustead  Chief Complaint  Patient presents with   Pulmonary Consult    Referred by Dr. Edwinna Areola. Pt c/o SOB x 6 months.  She states SOB can occue with or without exertion.  She is using her albuterol inhaler 2-4 x per day.   Dyspnea:  MMRC1 = can walk nl pace, flat grade, can't hurry or go uphills or steps s sob  Episodes of sob at rest resolve w/in 5 min p saba varies up to 4 x daily max assoc with chest tightness than never comes on with ex  Walks dog up to [address] loop some hills/ all conditions   Cough: dry cough x 10 years - more day than noct - wakes her up  Maybe once a week Sleep: ok to lie flat sound sleep / 4/7 nights at bedtime feels need for hs saba also  SABA use: freq needs  p supper o/w no pattern to daytime use  rec Plan A = Automatic = Symbicort 160 Take 2 puffs first thing in am and then another 2 puffs about 12 hours later.  Work on inhaler technique:  Plan B = Backup Only use your albuterol inhaler        08/25/2018  f/u ov/Sloan Takagi re: asthma vs panic disorder ? better on symb 160 2bid but never filled rx "the sample has not run out " (15 day given 07/20/18)  Chief Complaint  Patient presents with   Follow-up    pt states she is doing well, does note some dyspnea both at rest  and exertion.  Denies wheezing, cough, mucus production.   Dyspnea:  Walking dog daily up some hills / walks all over school s problem with sob but no aerobics   Cough: none Sleeping: no longer using albuterol at bedtime, still occ spell wakes her but much more likely at rest sitting - see below  SABA use: none at all since last ov  02: no  Spells of sob at rest assoc with panic sensation lasting up to 10 min and resolves s rx  - this too though goes away p depomedrol  rec Continue symbicort 160 Take 2 puffs first thing in am and then another 2 puffs about 12 hours later.  Only use your albuterol as a rescue medication     12/13/2020  Re-establish  ov/Omer Monter re: asthma vs panic disorder  Heart racing and sob since death of her mother  Chief Complaint  Patient presents with   Follow-up    Patient is having tightness in chest that comes and goes. Started a couple weeks ago. Denies cough  Dyspnea:  Walks up to 2 miles a day s symptoms of palpitations though ex tol is  not as good as used to be, no chest tightness with ex  Cough:  None  Sleeping: rarely wakes up with any sob/ chest tightness or sob  SABA use: 6 x in last year on just symbicort 160 x 2 each am  02: none  Covid status:   X 3 pfizer  Rec Plan A = Automatic = Always=   Symbicort 160 Take 2 puffs first thing in am and then another 2 puffs about 12 hours later.  Plan B = Backup (to supplement plan A, not to replace it) Only use your albuterol inhaler as a rescue medication Try albuterol 15 min before an activity that you know would make you short of breath  If you continue to have chest tightness that doesn't respond to this then next step is:  Try prilosec otc '20mg'$   Take 30-60 min before first meal of the day and Pepcid ac (famotidine) 20 mg after supper  until return. GERD diet     04/22/2022  f/u ov/Irvine Glorioso re: ***   maint on ***  No chief complaint on file.   Dyspnea:  *** Cough: *** Sleeping: *** SABA use: *** 02:  *** Covid status:   ***   No obvious day to day or daytime variability or assoc excess/ purulent sputum or mucus plugs or hemoptysis or cp or chest tightness, subjective wheeze or overt sinus or hb symptoms.   *** without nocturnal  or early am exacerbation  of respiratory  c/o's or need for noct saba. Also denies any obvious fluctuation of symptoms with weather or environmental changes or other aggravating or alleviating factors except as outlined above   No unusual exposure hx or h/o childhood pna/ asthma or knowledge of premature birth.  Current Allergies, Complete Past Medical History, Past Surgical History, Family History, and Social History were reviewed in [employer] record.  ROS  The following are not active complaints unless bolded Hoarseness, sore throat, dysphagia, dental problems, itching, sneezing,  nasal congestion or discharge of excess mucus or purulent secretions, ear ache,   fever, chills, sweats, unintended wt loss or wt gain, classically pleuritic or exertional cp,  orthopnea pnd or arm/hand swelling  or leg swelling, presyncope, palpitations, abdominal pain, anorexia, nausea, vomiting, diarrhea  or change in bowel habits or change in bladder habits, change in stools or change in urine, dysuria, hematuria,  rash, arthralgias, visual complaints, headache, numbness, weakness or ataxia or problems with walking or coordination,  change in mood or  memory.        No outpatient medications have been marked as taking for the 04/22/22 encounter (Appointment) with Tanda Rockers, MD.                   Objective:    Wt  04/22/2022           ***  12/13/2020        179  08/25/18 170 lb 9.6 oz (77.4 kg)  07/20/18 171 lb 9.6 oz (77.8 kg)  07/13/18 171 lb (77.6 kg)      Vital signs reviewed  04/22/2022  - Note at rest 02 sats  ***% on ***   General appearance:    ***                Assessment

## 2022-04-29 ENCOUNTER — Other Ambulatory Visit: Payer: Self-pay | Admitting: Gastroenterology

## 2022-04-29 ENCOUNTER — Ambulatory Visit (AMBULATORY_SURGERY_CENTER): Payer: BC Managed Care – PPO | Admitting: Gastroenterology

## 2022-04-29 ENCOUNTER — Encounter: Payer: Self-pay | Admitting: Gastroenterology

## 2022-04-29 VITALS — BP 125/89 | HR 65 | Temp 97.1°F | Resp 12 | Ht 68.0 in | Wt 171.0 lb

## 2022-04-29 DIAGNOSIS — K259 Gastric ulcer, unspecified as acute or chronic, without hemorrhage or perforation: Secondary | ICD-10-CM

## 2022-04-29 DIAGNOSIS — K219 Gastro-esophageal reflux disease without esophagitis: Secondary | ICD-10-CM | POA: Diagnosis not present

## 2022-04-29 DIAGNOSIS — K319 Disease of stomach and duodenum, unspecified: Secondary | ICD-10-CM | POA: Diagnosis not present

## 2022-04-29 DIAGNOSIS — K449 Diaphragmatic hernia without obstruction or gangrene: Secondary | ICD-10-CM | POA: Diagnosis not present

## 2022-04-29 DIAGNOSIS — R1310 Dysphagia, unspecified: Secondary | ICD-10-CM

## 2022-04-29 MED ORDER — SODIUM CHLORIDE 0.9 % IV SOLN: 500.0000 mL | INTRAVENOUS | Status: DC

## 2022-04-29 MED ORDER — PANTOPRAZOLE SODIUM 40 MG PO TBEC: 40.0000 mg | DELAYED_RELEASE_TABLET | Freq: Every day | ORAL | 0 refills | Status: DC

## 2022-04-29 NOTE — Progress Notes (Signed)
Pt's states no medical or surgical changes since previsit or office visit. 

## 2022-04-29 NOTE — Progress Notes (Signed)
Called to room to assist during endoscopic procedure.  Patient ID and intended procedure confirmed with present staff. Received instructions for my participation in the procedure from the performing physician.  

## 2022-04-29 NOTE — Progress Notes (Signed)
Indication for EGD: solid food dysphagia  Please see my 04/16/22 office note for complete details. There has been no change in history or physical exam. The patient remains an appropriate candidate for monitored anesthesia care in the [place].

## 2022-04-29 NOTE — Patient Instructions (Signed)
Handout provided about hiatal hernia.  Resume previous diet.  Continue present medications.   Pantoprazole 40 mg every AM for 8 weeks.  Esophagram if biopsies are negative.     NO ASPIRIN, ASPIRIN CONTAINING PRODUCTS (BC OR GOODY POWDERS) OR NSAIDS (IBUPROFEN, Naproxen , ADVIL, ALEVE, AND MOTRIN) ; TYLENOL IS OK TO TAKE    YOU HAD AN ENDOSCOPIC PROCEDURE TODAY AT [facility]:   Refer to the procedure report that was given to you for any specific questions about what was found during the examination.  If the procedure report does not answer your questions, please call your gastroenterologist to clarify.  If you requested that your care partner not be given the details of your procedure findings, then the procedure report has been included in a sealed envelope for you to review at your convenience later.  YOU SHOULD EXPECT: Some feelings of bloating in the abdomen. Passage of more gas than usual.  Walking can help get rid of the air that was put into your GI tract during the procedure and reduce the bloating. If you had a lower endoscopy (such as a colonoscopy or flexible sigmoidoscopy) you may notice spotting of blood in your stool or on the toilet paper. If you underwent a bowel prep for your procedure, you may not have a normal bowel movement for a few days.  Please Note:  You might notice some irritation and congestion in your nose or some drainage.  This is from the oxygen used during your procedure.  There is no need for concern and it should clear up in a day or so.  SYMPTOMS TO REPORT IMMEDIATELY:   Following upper endoscopy (EGD)  Vomiting of blood or coffee ground material  New chest pain or pain under the shoulder blades  Painful or persistently difficult swallowing  New shortness of breath  Fever of 100F or higher  Black, tarry-looking stools  For urgent or emergent issues, a gastroenterologist can be reached at any hour by calling [phone]. Do not use  MyChart messaging for urgent concerns.    DIET:  We do recommend a small meal at first, but then you may proceed to your regular diet.  Drink plenty of fluids but you should avoid alcoholic beverages for 24 hours.  ACTIVITY:  You should plan to take it easy for the rest of today and you should NOT DRIVE or use heavy machinery until tomorrow (because of the sedation medicines used during the test).    FOLLOW UP: Our staff will call the number listed on your records the next business day following your procedure.  We will call around 7:15- 8:00 am to check on you and address any questions or concerns that you may have regarding the information given to you following your procedure. If we do not reach you, we will leave a message.     If any biopsies were taken you will be contacted by phone or by letter within the next 1-3 weeks.  Please call us at [phone] if you have not heard about the biopsies in 3 weeks.    SIGNATURES/CONFIDENTIALITY: You and/or your care partner have signed paperwork which will be entered into your electronic medical record.  These signatures attest to the fact that that the information above on your After Visit Summary has been reviewed and is understood.  Full responsibility of the confidentiality of this discharge information lies with you and/or your care-partner.

## 2022-04-29 NOTE — Progress Notes (Signed)
Report to PACU, RN, vss, BBS= Clear.  

## 2022-04-29 NOTE — Op Note (Signed)
[place] Patient Name: Nicole Fleming Procedure Date: 04/29/2022 2:28 PM MRN: [id number] Endoscopist: Thornton Park MD, MD Age: 61 Referring MD:  Date of Birth: 07-Jun-1961 Gender: Female Account #: [id number] Procedure:                Upper GI endoscopy Indications:              Dysphagia Medicines:                Monitored Anesthesia Care Procedure:                Pre-Anesthesia Assessment:                           - Prior to the procedure, a History and Physical                            was performed, and patient medications and                            allergies were reviewed. The patient's tolerance of                            previous anesthesia was also reviewed. The risks                            and benefits of the procedure and the sedation                            options and risks were discussed with the patient.                            All questions were answered, and informed consent                            was obtained. Prior Anticoagulants: The patient has                            taken no previous anticoagulant or antiplatelet                            agents. ASA Grade Assessment: II - A patient with                            mild systemic disease. After reviewing the risks                            and benefits, the patient was deemed in                            satisfactory condition to undergo the procedure.                           After obtaining informed consent, the endoscope was  passed under direct vision. Throughout the                            procedure, the patient's blood pressure, pulse, and                            oxygen saturations were monitored continuously. The                            GIF [id number] #[id number] was introduced through the                            mouth, and advanced to the third part of duodenum.                            The upper GI endoscopy was accomplished  without                            difficulty. The patient tolerated the procedure                            well. Scope In: Scope Out: Findings:                 No endoscopic abnormality was evident in the                            esophagus to explain the patient's complaint of                            dysphagia. It was decided, however, to proceed with                            dilation of the lower third of the esophagus. A TTS                            dilator was passed through the scope. Dilation with                            a 16-17-18 mm balloon dilator was performed to 18                            mm. There was no resistance to a fully inflated                            balloon throughout the esophagus. The dilation site                            was examined and showed no change. Biopsies were                            taken from the mid/proximal and distal esophagus  with a cold forceps for histology. Estimated blood                            loss was minimal.                           A large hiatal hernia was present.                           A few small erosions with no bleeding and no                            stigmata of recent bleeding were found in the                            gastric antrum. There are mild verrucous changes to                            the prepyloric mucosa. Biopsies were taken from the                            antrum, body, and fundus with a cold forceps for                            histology. Estimated blood loss was minimal.                           The examined duodenum was normal. Complications:            No immediate complications. Estimated Blood Loss:     Estimated blood loss was minimal. Impression:               - No endoscopic esophageal abnormality to explain                            patient's dysphagia. Esophagus dilated. Dilated.                            Biopsied.                            - Large hiatal hernia.                           - Erosive gastropathy with no bleeding and no                            stigmata of recent bleeding. Biopsied.                           - Normal examined duodenum. Recommendation:           - Patient has a contact number available for                            emergencies. The signs and symptoms of potential  delayed complications were discussed with the                            patient. Return to normal activities tomorrow.                            Written discharge instructions were provided to the                            patient.                           - Resume previous diet.                           - Continue present medications.                           - Await pathology results.                           - No aspirin, ibuprofen, naproxen, or other                            non-steroidal anti-inflammatory drugs.                           - Pantoprazole 40 mg QAM x 8 weeks.                           - Esophagram if biopsies are negative. Thornton Park MD, MD 04/29/2022 2:58:47 PM This report has been signed electronically.

## 2022-04-30 ENCOUNTER — Telehealth: Payer: Self-pay

## 2022-04-30 DIAGNOSIS — R0789 Other chest pain: Secondary | ICD-10-CM

## 2022-04-30 DIAGNOSIS — K449 Diaphragmatic hernia without obstruction or gangrene: Secondary | ICD-10-CM

## 2022-04-30 DIAGNOSIS — R1310 Dysphagia, unspecified: Secondary | ICD-10-CM

## 2022-04-30 NOTE — Telephone Encounter (Signed)
Attempted to call pt again for more information and to check on her. Phone goes straight to voicemail. Left a message. Home and mobile number listed are the same contact number. Asked pt to call the office back with an update.

## 2022-04-30 NOTE — Telephone Encounter (Signed)
Left message for patient to call back  

## 2022-04-30 NOTE — Telephone Encounter (Signed)
Left message on follow up call. 

## 2022-04-30 NOTE — Telephone Encounter (Signed)
  Follow up Call-     04/29/2022    2:03 PM  Call back number  Post procedure Call Back phone  # [phone]  Permission to leave phone message Yes     Patient questions:  Do you have a fever, pain , or abdominal swelling? No. Pain Score  4 *  Have you tolerated food without any problems? No.  Have you been able to return to your normal activities? No.  Do you have any questions about your discharge instructions: Diet   Yes.   Medications  No. Follow up visit  No.  Do you have questions or concerns about your Care? Yes.  Pt states she feels like something is stuck, has a choking sensation.  Pt states she feels like she would feel better if she could vomit.  Actions: * If pain score is 4 or above: Physician/ provider Notified : Joelene Millin L. Tarri Glenn, MD.

## 2022-04-30 NOTE — Telephone Encounter (Signed)
Patient returned call & stated that she feels that there is something lodged in the middle of her chest. EGD was performed yesterday. Denies pain, just states it feels like a constant pressure. She is able to hold down fluids & was able to eat soup earlier with no difficulty. Currently there are no APP appointments available anytime soon. Will route to MD for further recommendations.

## 2022-04-30 NOTE — Telephone Encounter (Signed)
I called and spoke with patient. She states she felt something different after waking up in regards to the dysphagia symptoms (she was being worked up for this). However felt comfortable to go home and slept. It was when she awoke yesterday that she was experiencing a different dysphagia-like symptom that is just near the rib-cage ending. I have reviewed her EGD report and she has evidence of a large noted hiatal hernia. I think a complication is unlikely and this is probably more irritation and nociceptor/pain activation but we will need to be vigilant. Gastric volvulus from her HH seems less likely as well. She has been able to maintain her intake with soups today and that did not come back up. I will have her continue this with soups/soft foods today. She can come in tomorrow for CXR/KUB to ensure no complication. Will have Dr. Payton Emerald team reach out to see how patient is doing and then work on schduling a barium swallow if issues are still persisting. She has been instructed to initiate the PPI that was prescribed yesterday. She may need a GI cocktail but will hold on that for now. I'll place the orders for radiology as STAT. She agrees with this plan of action. If she were to develop inability to tolerate any oral intake she will need to come into the hospital.  Justice Britain, MD [facility] Advanced Endoscopy Office # [phone]

## 2022-04-30 NOTE — Telephone Encounter (Signed)
Routing to PM doc of the day

## 2022-05-01 ENCOUNTER — Ambulatory Visit (INDEPENDENT_AMBULATORY_CARE_PROVIDER_SITE_OTHER)
Admission: RE | Admit: 2022-05-01 | Discharge: 2022-05-01 | Disposition: A | Discharge status: Home / Self Care | Payer: BC Managed Care – PPO | Source: Ambulatory Visit | Attending: Gastroenterology | Admitting: Gastroenterology

## 2022-05-01 DIAGNOSIS — K449 Diaphragmatic hernia without obstruction or gangrene: Secondary | ICD-10-CM | POA: Diagnosis not present

## 2022-05-01 DIAGNOSIS — R1310 Dysphagia, unspecified: Secondary | ICD-10-CM

## 2022-05-01 DIAGNOSIS — R0789 Other chest pain: Secondary | ICD-10-CM | POA: Diagnosis not present

## 2022-05-01 NOTE — Telephone Encounter (Signed)
Called patient to get an update on her symptoms. She stated the pain has subsided some, however she still has the discomfort in the middle of her chest that feels like "she has had a bite of food that is lodged." She tolerated a soft diet today with no issues. Patient mentioned that a barium swallow study was offered to her yesterday, however she does not want to proceed. She would prefer to try Tylenol today to see if it provides any relief. Will update MD & call to check in with patient tomorrow.

## 2022-05-01 NOTE — Telephone Encounter (Signed)
Left message for patient to call back  

## 2022-05-02 ENCOUNTER — Other Ambulatory Visit: Payer: Self-pay

## 2022-05-02 MED ORDER — LIDOCAINE VISCOUS HCL 2 % MT SOLN: 30.0000 mL | Freq: Four times a day (QID) | OROMUCOSAL | 1 refills | Status: DC | PRN

## 2022-05-02 NOTE — Telephone Encounter (Signed)
Left message for patient to call back  

## 2022-05-02 NOTE — Addendum Note (Signed)
Addended by: Justice Britain on: 05/02/2022 05:07 PM   Modules accepted: Orders

## 2022-05-02 NOTE — Telephone Encounter (Signed)
Patient replied through [facility] with an update on her symptoms: "Thank you for reaching out to me again today.  The discomfort feeling is still present.  Last night I ate mashed potatoes and felt like I was choking.  Today I ate a hash brown and a ham & cheese slider feeling the same way.  At lunch I felt as though I might throw-up but I didn't.  It still feels as though I swallowed something that didn't go all the way down, but when I eat or drink it seems to pass by.  Today I have had some pain on my right side where the hernia is."  Dr. Rush Landmark, are you able to clarify order for GI cocktail. Do you prefer the following be sent in: 90 mL viscous lidocaine 90 mL '10mg'$ /68m dicyclomine 270 mL maalox  Swish & Swallow 30 mL QID

## 2022-05-02 NOTE — Telephone Encounter (Signed)
Louis Meckel to hear this.  Her x-rays were normal.  Agree that if she is still having issues into tomorrow would still recommend barium swallow but if she defers on this that is fine.  You could send in a GI cocktail to see if that may curb some of her issues (3-4 doses every 12 hours as needed).  Thanks. GM

## 2022-05-14 ENCOUNTER — Ambulatory Visit: Payer: BC Managed Care – PPO | Admitting: Physician Assistant

## 2022-05-20 ENCOUNTER — Encounter (HOSPITAL_BASED_OUTPATIENT_CLINIC_OR_DEPARTMENT_OTHER): Payer: Self-pay | Admitting: Nurse Practitioner

## 2022-05-20 ENCOUNTER — Ambulatory Visit (HOSPITAL_BASED_OUTPATIENT_CLINIC_OR_DEPARTMENT_OTHER): Payer: BC Managed Care – PPO | Admitting: Nurse Practitioner

## 2022-05-20 VITALS — BP 117/81 | HR 73 | Ht 68.0 in | Wt 167.0 lb

## 2022-05-20 DIAGNOSIS — M62830 Muscle spasm of back: Secondary | ICD-10-CM | POA: Insufficient documentation

## 2022-05-20 DIAGNOSIS — H938X3 Other specified disorders of ear, bilateral: Secondary | ICD-10-CM | POA: Diagnosis not present

## 2022-05-20 MED ORDER — CYCLOBENZAPRINE HCL 10 MG PO TABS: 10.0000 mg | ORAL_TABLET | Freq: Every day | ORAL | 0 refills | Status: DC

## 2022-05-20 NOTE — Assessment & Plan Note (Signed)
Bilateral ear dryness.  Discussed benign etiology with patient today.  Suggest use of moisturizers such as coconut oil to help improve moisture in and around the ears.  Patient will let me know if this is not effective.

## 2022-05-20 NOTE — Assessment & Plan Note (Signed)
Tenderness and spasm noted to the lumbar spine.  Range of motion intact with exacerbation of pain with flexion and extension.  We will send in Flexeril for use at bedtime.  Recommend ibuprofen during the day.  May also consider ice and heat.  Gentle stretching exercises provided.  If symptoms worsen or fail to improve we will consider formal PT.

## 2022-05-20 NOTE — Progress Notes (Signed)
Orma Render, DNP, AGNP-c Primary Care [facility] Sports Medicine [address]  [place],  [address] [phone]  Subjective:   Nicole Fleming is a 61 y.o. female presents to day for evaluation of: Follow-up (Patient presents today for follow up for itching ears bilateral.)  Itching Ears She tells me these are dry and irritated and has been ongoing since she had COVID She tells me she has been using over the counter ear drops to help with moisture and she tells me that helps, but the dryness returns quickly She would like this evaluated today.  She denies ear pain, hearing changes, drainage.  Back Pain She also endorses low back pain that started a few weeks ago.  She endorses muscle spasms that seemed to worsen while on a long bus trip to [place]. She tells me that mowing the grass yesterday seemed to aggravate the symptoms.  She denies paresthesias, bowel/bladder incontinence, injury, or movement limitations.   PMH, Medications, and Allergies reviewed and updated in chart as appropriate.   ROS negative except for what is listed in HPI. Objective:  BP 117/81   Pulse 73   Ht '5\' 8"'$  (1.727 m)   Wt 167 lb (75.8 kg)   LMP 11/26/2011   SpO2 93%   BMI 25.39 kg/m  Physical Exam Vitals and nursing note reviewed.  Constitutional:      General: She is not in acute distress.    Appearance: Normal appearance.  HENT:     Head: Normocephalic.     Right Ear: Tympanic membrane normal.     Left Ear: Tympanic membrane normal.     Ears:     Comments: Dryness noted to the auricle. No erythema, edema, drainage present.  Eyes:     Extraocular Movements: Extraocular movements intact.     Conjunctiva/sclera: Conjunctivae normal.     Pupils: Pupils are equal, round, and reactive to light.  Neck:     Vascular: No carotid bruit.  Cardiovascular:     Rate and Rhythm: Normal rate and regular rhythm.     Pulses: Normal pulses.     Heart sounds: Normal heart  sounds. No murmur heard. Pulmonary:     Effort: Pulmonary effort is normal.     Breath sounds: Normal breath sounds. No wheezing.  Abdominal:     General: Bowel sounds are normal. There is no distension.     Palpations: Abdomen is soft.     Tenderness: There is no abdominal tenderness. There is no guarding.  Musculoskeletal:        General: Tenderness present.     Cervical back: Normal range of motion and neck supple.     Right lower leg: No edema.     Left lower leg: No edema.     Comments: Tenderness to the lumbar spine bilaterally.  Muscle spasms noted.  Lymphadenopathy:     Cervical: No cervical adenopathy.  Skin:    General: Skin is warm and dry.     Capillary Refill: Capillary refill takes less than 2 seconds.  Neurological:     General: No focal deficit present.     Mental Status: She is alert and oriented to person, place, and time.  Psychiatric:        Mood and Affect: Mood normal.        Behavior: Behavior normal.        Thought Content: Thought content normal.        Judgment: Judgment normal.  Assessment & Plan:   Problem List Items Addressed This Visit     Irritation of ear, bilateral - Primary    Bilateral ear dryness.  Discussed benign etiology with patient today.  Suggest use of moisturizers such as coconut oil to help improve moisture in and around the ears.  Patient will let me know if this is not effective.      Spasm of muscle of lower back    Tenderness and spasm noted to the lumbar spine.  Range of motion intact with exacerbation of pain with flexion and extension.  We will send in Flexeril for use at bedtime.  Recommend ibuprofen during the day.  May also consider ice and heat.  Gentle stretching exercises provided.  If symptoms worsen or fail to improve we will consider formal PT.      Relevant Medications   cyclobenzaprine (FLEXERIL) 10 MG tablet      Orma Render, DNP, AGNP-c 05/20/2022  1:13 PM    History, Medications, Surgery,  SDOH, and Family History reviewed and updated as appropriate.

## 2022-05-20 NOTE — Patient Instructions (Addendum)
I recommend trying Cocoanut oil in the ears to see if this helps with moisture. If it does not seem to be helping, please send me a message and let me know and we can try something different.   I have sent in the flexeril for you to use at bedtime for your back pain. If this continues, please let me know.

## 2022-05-27 ENCOUNTER — Encounter: Payer: Self-pay | Admitting: Gastroenterology

## 2022-05-27 ENCOUNTER — Ambulatory Visit: Payer: BC Managed Care – PPO | Admitting: Gastroenterology

## 2022-06-02 ENCOUNTER — Encounter: Payer: Self-pay | Admitting: Physician Assistant

## 2022-06-02 ENCOUNTER — Ambulatory Visit: Payer: BC Managed Care – PPO | Admitting: Physician Assistant

## 2022-06-02 VITALS — BP 150/90 | HR 88 | Ht 68.0 in | Wt 171.0 lb

## 2022-06-02 DIAGNOSIS — R09A2 Foreign body sensation, throat: Secondary | ICD-10-CM

## 2022-06-02 DIAGNOSIS — R1310 Dysphagia, unspecified: Secondary | ICD-10-CM | POA: Diagnosis not present

## 2022-06-02 DIAGNOSIS — K449 Diaphragmatic hernia without obstruction or gangrene: Secondary | ICD-10-CM | POA: Diagnosis not present

## 2022-06-02 NOTE — Patient Instructions (Signed)
Start back on pantoprazole 40 mg every morning prior to breakfast.   Make sue your stomach is empty 3 hours prior to bed.   Elevate the head of your bed 45 degrees for sleep.  You have been scheduled for a Barium Esophogram at [facility] (1st floor of the hospital) on 06/04/22 at 11:00am. Please arrive 30 minutes prior to your appointment for registration. Make certain not to have anything to eat or drink 3 hours prior to your test. If you need to reschedule for any reason, please contact radiology at [phone] to do so. __________________________________________________________________ A barium swallow is an examination that concentrates on views of the esophagus. This tends to be a double contrast exam (barium and two liquids which, when combined, create a gas to distend the wall of the oesophagus) or single contrast (non-ionic iodine based). The study is usually tailored to your symptoms so a good history is essential. Attention is paid during the study to the form, structure and configuration of the esophagus, looking for functional disorders (such as aspiration, dysphagia, achalasia, motility and reflux) EXAMINATION You may be asked to change into a gown, depending on the type of swallow being performed. A radiologist and radiographer will perform the procedure. The radiologist will advise you of the type of contrast selected for your procedure and direct you during the exam. You will be asked to stand, sit or lie in several different positions and to hold a small amount of fluid in your mouth before being asked to swallow while the imaging is performed .In some instances you may be asked to swallow barium coated marshmallows to assess the motility of a solid food bolus. The exam can be recorded as a digital or video fluoroscopy procedure. POST PROCEDURE It will take 1-2 days for the barium to pass through your system. To facilitate this, it is important, unless otherwise directed,  to increase your fluids for the next 24-48hrs and to resume your normal diet.  This test typically takes about 30 minutes to perform. __________________________________________________________________________________ The [facility] providers would like to encourage you to use [facility] to communicate with providers for non-urgent requests or questions.  Due to long hold times on the telephone, sending your provider a message by [facility] may be a faster and more efficient way to get a response.  Please allow 48 business hours for a response.  Please remember that this is for non-urgent requests.   Due to recent changes in healthcare laws, you may see the results of your imaging and laboratory studies on MyChart before your provider has had a chance to review them.  We understand that in some cases there may be results that are confusing or concerning to you. Not all laboratory results come back in the same time frame and the provider may be waiting for multiple results in order to interpret others.  Please give [place] 48 hours in order for your provider to thoroughly review all the results before contacting the office for clarification of your results.

## 2022-06-02 NOTE — Progress Notes (Signed)
Subjective:    Patient ID: Nicole Fleming Fleming, female    DOB: 09-Oct-1960, 61 y.o.   MRN: [id number]  HPI Nicole Fleming Fleming is a 61 year old white female, established with Dr. Tarri Glenn who was initially seen on 04/16/2022 with complaints of dysphagia to solids for the previous 6 to 8 months per the notes.  Patient expressed complaint of feeling like foods are getting stuck in her hiatal hernia.  She had had a cardiac CT done September 10, 2021 which did show a large hiatal hernia. She had expressed symptom of dysphagia being in the area of the subxiphoid.  She underwent EGD on 04/29/2022 which showed a normal-appearing esophagus, and a large hiatal hernia.  She underwent empiric dilation in the distal third of the esophagus from 16 to 18 mm.  Noted to have a few erosions in the gastric antrum and some erosive changes in the prepylorus.  Biopsies showed reactive gastropathy no H. pylori.  Biopsies of the distal esophagus showed no increased eosinophilia and mid distal esophageal biopsies also negative with no increased eosinophilia.  She was started on a trial of Protonix 40 mg p.o. every morning AC.  She comes back in today for follow-up.  She has not been doing well since the procedure.  She has a new sensation of fullness "like something is caught in my throat", and points to the area of the suprasternal notch. She says this was not present prior to the procedure and is concerned about what is causing it and why it is persisting.  She took the Protonix for about 10 days and then stopped it because she did not notice any change in her symptoms. Initially she said she was not having any dysphagia symptoms prior to the procedure but on further discussion says that she was having the symptoms of food feeling as if it was slow to traverse through the end of her esophagus in the area of the hiatal hernia.  She says if she eats soft foods or just liquids she does not have any trouble but with heavier foods and meats has this  sensation and sometimes has to press on that area to assist food moving through.  She did not notice any improvement in this symptom after the esophageal dilation.  She expressed being unhappy with her care, says has been very difficult to get through our phone system, that she has not had good response to [employer], and is frustrated.  She wants to know what is going to be done about the hiatal hernia. She did have an appointment with Dr. Tarri Glenn about a week ago but this had been canceled due to Dr. Tarri Glenn being out ill.  Review of Systems Pertinent positive and negative review of systems were noted in the above HPI section.  All other review of systems was otherwise negative.   Outpatient Encounter Medications as of 06/02/2022  Medication Sig   albuterol (PROAIR HFA) 108 (90 Base) MCG/ACT inhaler 2 puffs every 4 hours as needed for short of breath, wheezing or chest tightness   benzonatate (TESSALON PERLES) 100 MG capsule Take 1 capsule (100 mg total) by mouth 3 (three) times daily as needed for cough.   budesonide-formoterol (SYMBICORT) 160-4.5 MCG/ACT inhaler Inhale 2 puffs into the lungs 2 (two) times daily.   terconazole (TERAZOL 7) 0.4 % vaginal cream Place 1 applicator vaginally at bedtime.   [DISCONTINUED] cyclobenzaprine (FLEXERIL) 10 MG tablet Take 1 tablet (10 mg total) by mouth at bedtime.   [DISCONTINUED] GI Cocktail (  alum & mag hydroxide, lidocaine, dicyclomine) oral mixture Take 30 mLs by mouth 4 (four) times daily as needed.   [DISCONTINUED] meloxicam (MOBIC) 15 MG tablet Take 1 tablet (15 mg total) by mouth daily. (Patient not taking: Reported on 05/20/2022)   [DISCONTINUED] pantoprazole (PROTONIX) 40 MG tablet TAKE 1 TABLET(40 MG) BY MOUTH DAILY   [DISCONTINUED] phenazopyridine (PYRIDIUM) 100 MG tablet Take 1 tablet (100 mg total) by mouth 3 (three) times daily as needed for pain.   [DISCONTINUED] sulfamethoxazole-trimethoprim (BACTRIM DS) 800-160 MG tablet Take 1 tablet  by mouth 2 (two) times daily.   No facility-administered encounter medications on file as of 06/02/2022.   Allergies  Allergen Reactions   Amoxicillin Rash    Allergic to the Grape flavor in liquid amoxicillin   Patient Active Problem List   Diagnosis Date Noted   Irritation of ear, bilateral 05/20/2022   Spasm of muscle of lower back 05/20/2022   Puncture wound of foot 03/17/2022   Statin intolerance 01/15/2022   Hiatal hernia 01/15/2022   Hyperlipidemia 09/17/2021   Elevated coronary artery calcium score 09/17/2021   Palpitations 05/24/2021   Hallux rigidus of left foot 01/23/2020   Asthma, chronic, mild persistent, uncomplicated 06/03/5101   DOE (dyspnea on exertion) 07/20/2018   Skin lesions - abdominal wall 01/20/2013   Chest pain 06/09/2012   Mitral valve prolapse 06/09/2012   External hemorrhoids 01/26/2012   Lipoma of abdominal wall 10/16/2011   RUQ abdominal pain 06/25/2011   Social History   Socioeconomic History   Marital status: Single    Spouse name: Not on file   Number of children: 2   Years of education: Not on file   Highest education level: Not on file  Occupational History   Occupation: [employer]  Tobacco Use   Smoking status: Never   Smokeless tobacco: Never  Vaping Use   Vaping Use: Never used  Substance and Sexual Activity   Alcohol use: No   Drug use: No   Sexual activity: Not Currently    Partners: Male    Birth control/protection: Other-see comments    Comment: BTL   Other Topics Concern   Not on file  Social History Narrative   Not on file   Social Determinants of Health   Financial Resource Strain: Not on file  Food Insecurity: Not on file  Transportation Needs: Not on file  Physical Activity: Not on file  Stress: Not on file  Social Connections: Not on file  Intimate Partner Violence: Not on file    [name]'s family history includes Heart disease (age of onset: 46) in her father.      Objective:     Vitals:   06/02/22 1434  BP: (!) 150/90  Pulse: 88  SpO2: 98%    Physical Exam Well-developed well-nourished WF  in no acute distress.  Height, Weight,171  BMI 26  HEENT; nontraumatic normocephalic, EOMI, PE R LA, sclera anicteric. Oropharynx;notexamined today  Neck; supple, no JVD, no palpable thyromegaly Cardiovascular; regular rate and rhythm with S1-S2, no murmur rub or gallop Pulmonary; Clear bilaterally Abdomen; soft, there is some mild tenderness in the epigastrium nondistended, no palpable mass or hepatosplenomegaly, bowel sounds are active Rectal; not done today Skin; benign exam, no jaundice rash or appreciable lesions Extremities; no clubbing cyanosis or edema skin warm and dry Neuro/Psych; alert and oriented x4, grossly nonfocal mood and affect appropriate        Assessment & Plan:   #60 61 year old female with large  hiatal hernia documented on cardiac CT, referred here for evaluation and had complaints of solid food dysphagia with heavier foods and meat, with sensation of food being slow to traverse through the distal esophagus/area of the hiatal hernia. EGD 04/29/2022 with normal-appearing esophagus, no esophageal stricture, large hiatal hernia-empiric dilation was done  No improvement in symptoms post dilation and has developed new symptom in addition of fullness in the throat which has persisted over the past month.  No evidence for eosinophilic esophagitis on biopsies  , Rule out motility disorder, rule out symptomatic hiatal hernia in setting of documented large hiatal hernia  Suspect the new fullness in the throat is a manifestation of globus.  #2 colon cancer screening-negative Cologuard 2021-prior colonoscopy per Dr. Collene Mares, plan  follow-up colonoscopy 2026  Plan; patient is encouraged to resume Protonix 40 mg p.o. every morning AC breakfast, and would like her to complete a 54-monthcourse. We will schedule for barium swallow with tablet Will await barium  swallow results, if no other findings noted at barium swallow she will benefit from surgical consultation to discuss hiatal hernia repair.     Taliyah Watrous S Glenice Ciccone PA-C 06/02/2022   Cc: EOrma Render NP

## 2022-06-04 ENCOUNTER — Ambulatory Visit (HOSPITAL_COMMUNITY)
Admission: RE | Admit: 2022-06-04 | Discharge: 2022-06-04 | Disposition: A | Discharge status: Home / Self Care | Payer: BC Managed Care – PPO | Source: Ambulatory Visit | Attending: Physician Assistant | Admitting: Physician Assistant

## 2022-06-04 DIAGNOSIS — R1310 Dysphagia, unspecified: Secondary | ICD-10-CM | POA: Diagnosis present

## 2022-06-04 NOTE — Progress Notes (Signed)
Would recommend esophageal manometry to further evaluate her dysmotility and globus, which needs to be evaluated prior to considering hiatal hernia surgery. We could repeat dilation but I would only do that if dysphagia is a predominant symptom.

## 2022-06-10 ENCOUNTER — Encounter: Payer: Self-pay | Admitting: Gastroenterology

## 2022-06-16 ENCOUNTER — Ambulatory Visit (INDEPENDENT_AMBULATORY_CARE_PROVIDER_SITE_OTHER): Payer: BC Managed Care – PPO | Admitting: Obstetrics & Gynecology

## 2022-06-16 ENCOUNTER — Encounter (HOSPITAL_BASED_OUTPATIENT_CLINIC_OR_DEPARTMENT_OTHER): Payer: Self-pay | Admitting: Obstetrics & Gynecology

## 2022-06-16 ENCOUNTER — Other Ambulatory Visit (HOSPITAL_COMMUNITY)
Admission: RE | Admit: 2022-06-16 | Discharge: 2022-06-16 | Disposition: A | Discharge status: Home / Self Care | Payer: BC Managed Care – PPO | Source: Ambulatory Visit | Attending: Obstetrics & Gynecology | Admitting: Obstetrics & Gynecology

## 2022-06-16 VITALS — BP 136/93 | HR 86 | Ht 68.0 in | Wt 170.0 lb

## 2022-06-16 DIAGNOSIS — Z01419 Encounter for gynecological examination (general) (routine) without abnormal findings: Secondary | ICD-10-CM

## 2022-06-16 DIAGNOSIS — Z124 Encounter for screening for malignant neoplasm of cervix: Secondary | ICD-10-CM | POA: Diagnosis present

## 2022-06-16 DIAGNOSIS — N6312 Unspecified lump in the right breast, upper inner quadrant: Secondary | ICD-10-CM

## 2022-06-16 DIAGNOSIS — R931 Abnormal findings on diagnostic imaging of heart and coronary circulation: Secondary | ICD-10-CM

## 2022-06-16 DIAGNOSIS — Z1211 Encounter for screening for malignant neoplasm of colon: Secondary | ICD-10-CM

## 2022-06-16 DIAGNOSIS — Z658 Other specified problems related to psychosocial circumstances: Secondary | ICD-10-CM

## 2022-06-16 DIAGNOSIS — B977 Papillomavirus as the cause of diseases classified elsewhere: Secondary | ICD-10-CM | POA: Insufficient documentation

## 2022-06-16 DIAGNOSIS — K449 Diaphragmatic hernia without obstruction or gangrene: Secondary | ICD-10-CM

## 2022-06-16 MED ORDER — CITALOPRAM HYDROBROMIDE 20 MG PO TABS: 20.0000 mg | ORAL_TABLET | Freq: Every day | ORAL | 1 refills | Status: DC

## 2022-06-16 NOTE — Progress Notes (Addendum)
61 y.o. G2P2 Single White or Caucasian female here for annual exam.  Doing well.  Still with same person and they are talking about getting married.  He lives in [place].  Mother is living with her the last few years.  Her mother does not want to move to [place].  This is causes lots of stressors.  Pt has a large hiatal hernia.  She's seen Dr. Tarri Glenn and had esophageal stretching.  Continues to have some sensation of the presence of something in her throat.  It is not always present.  Wonders if some of this is stress related.    Referral to general surgery has been discussed.  Hasn't been done yet.  We discussed today and I will place referral.    Patient's last menstrual period was 11/26/2011.          Sexually active: Yes.    The current method of family planning is tubal ligation.    Smoker:  no  Health Maintenance: Pap:  06/12/2021 Negative History of abnormal Pap:  h/o HR HPV MMG:  07/08/2021 Negative Colonoscopy: cologuard 12/2019 BMD:   scheduled 09/10/2022 Screening Labs: 12/2021   reports that she has never smoked. She has never used smokeless tobacco. She reports that she does not drink alcohol and does not use drugs.  Past Medical History:  Diagnosis Date   Abnormal pap 08/18/1998   h/o LEEP   Asthma    Contact lens/glasses fitting    wears contacts or glasses   Depression    and anxiety   Hemorrhoids, external    Hiatal hernia    IBS (irritable bowel syndrome)    questionable gluten allergy   Mitral valve prolapse 08/18/2004   echo 2013-no MVP-normal valve-no regurg   Schatzki's ring 08/18/2005   of esophagus    Past Surgical History:  Procedure Laterality Date   [place]   CHOLECYSTECTOMY  12/2005   ESOPHAGEAL DILATION  1/07   HEMORRHOID SURGERY N/A 03/01/2013   Procedure: HEMORRHOIDECTOMY;  Surgeon: Imogene Burn. Georgette Dover, MD;  Location: [facility];  Service: General;  Laterality: N/A;   LEEP  1999    lipoma removal  10/29/2011   abdominal wall   SKIN BIOPSY Left 03/01/2013   Procedure: SKIN BIOPSY  X 2  ABDOMINAL WALL;  Surgeon: Imogene Burn. Tsuei, MD;  Location: [facility];  Service: General;  Laterality: Left;   TUBAL LIGATION      Current Outpatient Medications  Medication Sig Dispense Refill   albuterol (PROAIR HFA) 108 (90 Base) MCG/ACT inhaler 2 puffs every 4 hours as needed for short of breath, wheezing or chest tightness 18 g 1   benzonatate (TESSALON PERLES) 100 MG capsule Take 1 capsule (100 mg total) by mouth 3 (three) times daily as needed for cough. 30 capsule 0   budesonide-formoterol (SYMBICORT) 160-4.5 MCG/ACT inhaler Inhale 2 puffs into the lungs 2 (two) times daily. 1 each 0   terconazole (TERAZOL 7) 0.4 % vaginal cream Place 1 applicator vaginally at bedtime. 45 g 0   No current facility-administered medications for this visit.    Family History  Problem Relation Age of Onset   Heart disease Father 34       cardiac arrest/mitral valve replaced   Colon cancer Neg Hx    Colon polyps Neg Hx    Esophageal cancer Neg Hx    Stomach cancer Neg Hx    Rectal cancer Neg Hx  ROS: Constitutional: negative Genitourinary:negative  Exam:   BP (!) 136/93 (BP Location: Left Arm, Patient Position: Sitting, Cuff Size: Large)   Pulse 86   Ht '5\' 8"'$  (1.727 m) Comment: Reported  Wt 170 lb (77.1 kg)   LMP 11/26/2011   BMI 25.85 kg/m   Height: '5\' 8"'$  (172.7 cm) (Reported)  General appearance: alert, cooperative and appears stated age Head: Normocephalic, without obvious abnormality, atraumatic Neck: no adenopathy, supple, symmetrical, trachea midline and thyroid normal to inspection and palpation Lungs: clear to auscultation bilaterally Breasts: normal appearance, no masses or tenderness Heart: regular rate and rhythm Abdomen: soft, non-tender; bowel sounds normal; no masses,  no organomegaly Extremities: extremities normal, atraumatic, no cyanosis or  edema Skin: Skin color, texture, turgor normal. No rashes or lesions Lymph nodes: Cervical, supraclavicular, and axillary nodes normal. No abnormal inguinal nodes palpated Neurologic: Grossly normal   Pelvic: External genitalia:  no lesions              Urethra:  normal appearing urethra with no masses, tenderness or lesions              Bartholins and Skenes: normal                 Vagina: normal appearing vagina with normal color and no discharge, no lesions              Cervix: no lesions              Pap taken: No. Bimanual Exam:  Uterus:  normal size, contour, position, consistency, mobility, non-tender              Adnexa: no mass, fullness, tenderness               Rectovaginal: Confirms               Anus:  normal sphincter tone, no lesions  Chaperone, Octaviano Batty, CMA, was present for exam.  Assessment/Plan: 1. Well woman exam with routine gynecological exam - Pap smear obtained today - Mammogram 06/2021 - Colonoscopy declined.  Cologuard ordered.   - Bone mineral density 09/10/2022 - lab work done done with PCP - vaccines reviewed/updated  2. 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 Colon cancer screening - Cologuard  5. Hiatal hernia - Ambulatory referral to General Surgery  6. Elevated coronary artery calcium score  7. Psychosocial stressors - citalopram (CELEXA) 20 MG tablet; Take 1 tablet (20 mg total) by mouth daily.  Dispense: 90 tablet; Refill: 1

## 2022-06-20 LAB — CYTOLOGY - PAP
Comment: NEGATIVE
Comment: NEGATIVE
Comment: NEGATIVE
Diagnosis: NEGATIVE
Diagnosis: REACTIVE
HPV 16: NEGATIVE
HPV 18 / 45: NEGATIVE
High risk HPV: POSITIVE — AB

## 2022-06-26 ENCOUNTER — Ambulatory Visit: Payer: Self-pay | Admitting: General Surgery

## 2022-06-26 NOTE — H&P (Signed)
Chief Complaint: New Patient (Eval of hiatal hernia )       History of Present Illness: Nicole Fleming is a 61 y.o. female who is seen today as an office consultation at the request of Dr. Sabra Heck for evaluation of New Patient (Eval of hiatal hernia ) .   Patient is a 61 year old female who comes in secondary to a large hiatal hernia. Patient states that she had this diagnosed approximately a year ago.  This was on a chest CT scan.  Patient was at that time encouraged to follow-up for work-up for hiatal hernia. I discussed with the patient she has had some dysphagia with solids and sometimes liquids.  She states that foods that are more bolus type foods to include bread and meats she has difficulty with swallowing.  Patient denies any reflux however she does state that she has had a chronic cough as well as a sore throat after the last endoscopy.  She denies any regurgitation of any on digested food or bile.   Patient did have a CT scan which a reviewed personally which shows a large hiatal hernia.  Patient has also undergone endoscopy which I reviewed.   Patient was set to be scheduled for manometry however she has not had this done yet.   She did have an upper GI which I reviewed this did show some spasm of the esophagus.   She had previous lap chole by Dr. Wayne Both in the past       Review of Systems: A complete review of systems was obtained from the patient.  I have reviewed this information and discussed as appropriate with the patient.  See HPI as well for other ROS.   Review of Systems  Constitutional:  Negative for fever.  HENT:  Negative for congestion.   Eyes:  Negative for blurred vision.  Respiratory:  Negative for cough, shortness of breath and wheezing.   Cardiovascular:  Negative for chest pain and palpitations.  Gastrointestinal:  Positive for abdominal pain. Negative for heartburn.  Genitourinary:  Negative for dysuria.  Musculoskeletal:  Negative for myalgias.  Skin:   Negative for rash.  Neurological:  Negative for dizziness and headaches.  Psychiatric/Behavioral:  Negative for depression and suicidal ideas.   All other systems reviewed and are negative.       Medical History: Past Medical History Past Medical History: Diagnosis Date  Asthma    Hyperlipidemia        There is no problem list on file for this patient.     Past Surgical History Past Surgical History: Procedure Laterality Date  ESOPHAGEAL DILATION   08/2005  CHOLECYSTECTOMY   12/2005  lipoma removal   10/29/2011  Hemmorhoidectomy   03/01/2013   Dr. Georgette Dover  CESAREAN SECTION       [phone]  Tubal ligation          Allergies Allergies Allergen Reactions  Amoxicillin Rash     Allergic to the Grape flavor in liquid amoxicillin      Current Outpatient Medications on File Prior to Visit Medication Sig Dispense Refill  citalopram (CELEXA) 20 MG tablet Take 20 mg by mouth once daily      pantoprazole (PROTONIX) 40 MG DR tablet         No current facility-administered medications on file prior to visit.     Family History History reviewed. No pertinent family history.     Social History   Tobacco Use Smoking Status Never Smokeless Tobacco Never  Social History Social History    Socioeconomic History  Marital status: Single Tobacco Use  Smoking status: Never  Smokeless tobacco: Never Substance and Sexual Activity  Alcohol use: Never  Drug use: Never      Objective:     Vitals:   06/26/22 1018 BP: 132/80 Pulse: 96 Temp: 36.7 C (98 F) SpO2: 97% Weight: 77.7 kg (171 lb 6.4 oz) Height: 172.7 cm ('5\' 8"'$ )   Body mass index is 26.06 kg/m.   Physical Exam Constitutional:      Appearance: Normal appearance.  HENT:     Head: Normocephalic and atraumatic.     Mouth/Throat:     Mouth: Mucous membranes are moist.     Pharynx: Oropharynx is clear.  Eyes:     General: No scleral icterus.    Pupils: Pupils are equal, round, and reactive to  light.  Cardiovascular:     Rate and Rhythm: Normal rate and regular rhythm.     Pulses: Normal pulses.     Heart sounds: No murmur heard.    No friction rub. No gallop.  Pulmonary:     Effort: Pulmonary effort is normal. No respiratory distress.     Breath sounds: Normal breath sounds. No stridor.  Abdominal:     General: Abdomen is flat.  Musculoskeletal:        General: No swelling.  Skin:    General: Skin is warm.  Neurological:     General: No focal deficit present.     Mental Status: She is alert and oriented to person, place, and time. Mental status is at baseline.  Psychiatric:        Mood and Affect: Mood normal.        Thought Content: Thought content normal.        Judgment: Judgment normal.            Assessment and Plan: Diagnoses and all orders for this visit:   Hiatal hernia     Nicole Fleming is a 61 y.o. female    Patient with large hiatal hernia.  I do believe that she would benefit from having manometry testing.  I did discuss with her if this appeared normal we will proceed with robotic hiatal hernia repair and toupee fundoplication.  If she does have any positive findings with manometry to diagnose achalasia we would change surgery.  I would call her at that point to discuss the next steps.       1.  We will proceed to the OR for a robotic hiatal hernia repair with mesh and fundoplication 2. All risks and benefits were discussed with the patient, to generally include infection, bleeding, damage to surrounding structures, possible pneumothorax, and recurrence. Alternatives were offered and described.  All questions were answered and the patient voiced understanding of the procedure and wishes to proceed at this point.

## 2022-06-27 ENCOUNTER — Telehealth: Payer: Self-pay

## 2022-06-27 ENCOUNTER — Other Ambulatory Visit: Payer: Self-pay

## 2022-06-27 DIAGNOSIS — K449 Diaphragmatic hernia without obstruction or gangrene: Secondary | ICD-10-CM

## 2022-06-27 NOTE — Telephone Encounter (Signed)
Spoke with the patient. She has seen the surgeon about her hiatal hernia. Surgical repeair is scheduled in December. Dr Tarri Glenn is ordering the esophageal manometry needed prior to the surgery. Patient is aware and in agreement with this. Esophageal manometry 07/23/22. Instructions will be mailed to her. [place] beach" and unable to access her calendar. She accepts this date and will plan to arrive to [facility] at 8:00 am. Understands she will need to fast.

## 2022-07-01 ENCOUNTER — Ambulatory Visit
Admission: RE | Admit: 2022-07-01 | Discharge: 2022-07-01 | Disposition: A | Discharge status: Home / Self Care | Payer: BC Managed Care – PPO | Source: Ambulatory Visit | Attending: Obstetrics & Gynecology | Admitting: Obstetrics & Gynecology

## 2022-07-01 ENCOUNTER — Other Ambulatory Visit (HOSPITAL_BASED_OUTPATIENT_CLINIC_OR_DEPARTMENT_OTHER): Payer: Self-pay | Admitting: Obstetrics & Gynecology

## 2022-07-01 DIAGNOSIS — Z01419 Encounter for gynecological examination (general) (routine) without abnormal findings: Secondary | ICD-10-CM

## 2022-07-01 DIAGNOSIS — R931 Abnormal findings on diagnostic imaging of heart and coronary circulation: Secondary | ICD-10-CM

## 2022-07-01 DIAGNOSIS — Z124 Encounter for screening for malignant neoplasm of cervix: Secondary | ICD-10-CM

## 2022-07-01 DIAGNOSIS — B977 Papillomavirus as the cause of diseases classified elsewhere: Secondary | ICD-10-CM

## 2022-07-01 DIAGNOSIS — N6312 Unspecified lump in the right breast, upper inner quadrant: Secondary | ICD-10-CM

## 2022-07-01 DIAGNOSIS — Z658 Other specified problems related to psychosocial circumstances: Secondary | ICD-10-CM

## 2022-07-01 DIAGNOSIS — K449 Diaphragmatic hernia without obstruction or gangrene: Secondary | ICD-10-CM

## 2022-07-01 DIAGNOSIS — Z1211 Encounter for screening for malignant neoplasm of colon: Secondary | ICD-10-CM

## 2022-07-16 ENCOUNTER — Encounter: Payer: Self-pay | Admitting: Gastroenterology

## 2022-07-21 ENCOUNTER — Ambulatory Visit (HOSPITAL_BASED_OUTPATIENT_CLINIC_OR_DEPARTMENT_OTHER): Payer: BC Managed Care – PPO | Admitting: Nurse Practitioner

## 2022-07-23 ENCOUNTER — Ambulatory Visit (HOSPITAL_COMMUNITY)
Admission: RE | Admit: 2022-07-23 | Discharge: 2022-07-23 | Disposition: A | Discharge status: Home / Self Care | Payer: BC Managed Care – PPO | Attending: Gastroenterology | Admitting: Gastroenterology

## 2022-07-23 ENCOUNTER — Encounter (HOSPITAL_COMMUNITY)
Admission: RE | Disposition: A | Discharge status: Home / Self Care | Payer: Self-pay | Source: Home / Self Care | Attending: Gastroenterology

## 2022-07-23 DIAGNOSIS — K449 Diaphragmatic hernia without obstruction or gangrene: Secondary | ICD-10-CM | POA: Insufficient documentation

## 2022-07-23 DIAGNOSIS — K219 Gastro-esophageal reflux disease without esophagitis: Secondary | ICD-10-CM

## 2022-07-23 DIAGNOSIS — R1310 Dysphagia, unspecified: Secondary | ICD-10-CM

## 2022-07-23 HISTORY — PX: ESOPHAGEAL MANOMETRY: SHX5429

## 2022-07-23 SURGERY — MANOMETRY, ESOPHAGUS
Anesthesia: Choice

## 2022-07-23 MED ORDER — LIDOCAINE VISCOUS HCL 2 % MT SOLN
OROMUCOSAL | Status: AC
  Filled 2022-07-23: qty 15

## 2022-07-23 SURGICAL SUPPLY — 2 items
FACESHIELD LNG OPTICON STERILE (SAFETY) IMPLANT
GLOVE BIO SURGEON STRL SZ8 (GLOVE) ×2 IMPLANT

## 2022-07-23 NOTE — Progress Notes (Signed)
Esophageal Manometry done per protocol. Patient tolerated well without distress or complication.  

## 2022-07-24 ENCOUNTER — Ambulatory Visit: Payer: BC Managed Care – PPO | Admitting: Podiatry

## 2022-07-24 DIAGNOSIS — M79672 Pain in left foot: Secondary | ICD-10-CM

## 2022-07-24 DIAGNOSIS — M79671 Pain in right foot: Secondary | ICD-10-CM

## 2022-07-24 DIAGNOSIS — M79675 Pain in left toe(s): Secondary | ICD-10-CM | POA: Diagnosis not present

## 2022-07-24 DIAGNOSIS — S90122A Contusion of left lesser toe(s) without damage to nail, initial encounter: Secondary | ICD-10-CM | POA: Diagnosis not present

## 2022-07-24 DIAGNOSIS — M7989 Other specified soft tissue disorders: Secondary | ICD-10-CM | POA: Diagnosis not present

## 2022-07-24 NOTE — Patient Instructions (Signed)
Toe Fracture A toe fracture is a break in one of the toe bones (phalanges). This may happen if you: Drop a heavy object on your toe. Stub your toe. Twist your toe. Exercise the same way too much. What are the signs or symptoms? The main symptoms are swelling and pain in the toe. You may also have: Bruising. Stiffness. Numbness. A change in the way the toe looks. Broken bones that poke through the skin. Blood under the toenail. How is this treated? Treatments may include: Taping the broken toe to a toe that is next to it (buddy taping). Wearing a shoe that has a wide, rigid sole to protect the toe and to limit its movement. Wearing a cast. Surgery. This may be needed if the: Pieces of broken bone are out of place. Bone pokes through the skin. Physical therapy. Follow these instructions at home: If you have a shoe: Wear the shoe as told by your doctor. Remove it only as told by your doctor. Loosen the shoe if your toes tingle, become numb, or turn cold and blue. Keep the shoe clean and dry. If you have a cast: Do not put pressure on any part of the cast until it is fully hardened. This may take a few hours. Do not stick anything inside the cast to scratch your skin. Check the skin around the cast every day. Tell your doctor about any concerns. You may put lotion on dry skin around the edges of the cast. Do not put lotion on the skin under the cast. Keep the cast clean and dry. Bathing Do not take baths, swim, or use a hot tub until your doctor says it is okay. Ask your doctor if you can take showers. If the shoe or cast is not waterproof: Do not let it get wet. Cover it with a watertight covering when you take a bath or a shower. Activity Do not use your foot to support your body weight until your doctor says it is okay. Use crutches as told by your doctor. Ask your doctor what activities are safe for you during recovery. Avoid activities as told by your doctor. Do  exercises as told by your doctor or therapist. Driving Do not drive or use heavy machinery while taking pain medicine. Do not drive while wearing a cast on a foot that you use for driving. Managing pain, stiffness, and swelling  Put ice on the injured area if told by your doctor: Put ice in a plastic bag. Place a towel between your skin and the bag. If you have a shoe, remove it as told by your doctor. If you have a cast, place a towel between your cast and the bag. Leave the ice on for 20 minutes, 2-3 times per day. Raise (elevate) the injured area above the level of your heart while you are sitting or lying down. General instructions If your toe was taped to a toe that is next to it, follow your doctor's instructions for changing the gauze and tape. Change it more often: If the gauze and tape get wet. If this happens, dry the space between the toes. If the gauze and tape are too tight and they cause your toe to become pale or to lose feeling (go numb). If your doctor did not give you a protective shoe, wear sturdy shoes that support your foot. Your shoes should not: Pinch your toes. Fit tightly against your toes. Do not use any tobacco products, including cigarettes, chewing tobacco, or e-cigarettes.  These can delay bone healing. If you need help quitting, ask your doctor. Take medicines only as told by your doctor. Keep all follow-up visits as told by your doctor. This is important. Contact a doctor if: Your pain medicine is not helping. You have a fever. You notice a bad smell coming from your cast. Get help right away if: You lose feeling (have numbness) in your toe or foot, and it is getting worse. Your toe or your foot tingles. Your toe or your foot gets cold or turns blue. You have redness or swelling in your toe or foot, and it is getting worse. You have very bad pain. Summary A toe fracture is a break in one of the toe bones. Use ice and raise your foot. This will help  lessen pain and swelling. Use crutches as told by your doctor. This information is not intended to replace advice given to you by your health care provider. Make sure you discuss any questions you have with your health care provider. Document Revised: 01/07/2021 Document Reviewed: 01/07/2021 Elsevier Patient Education  [place].

## 2022-07-24 NOTE — Progress Notes (Signed)
Subjective:   Patient ID: Nicole Fleming, female   DOB: 61 y.o.   MRN: [id number]   HPI Chief Complaint  Patient presents with   Toe Injury    Patient came in today for a foot injury on Jun 04 2022, patient hit her foot with the car door, rate of pain 6 out of 10, red and swollen, X-Rays taken today     DOI- 06/04/2022  61 year old female presents the office today with the above concerns. She states that she hit her left foot on a car door on June 04, 2022 and since then she has had pain and swelling to the fourth and fifth toes.  She had no recent treatment after the injury.  No open lesions that she reports.  Review of Systems  All other systems reviewed and are negative.  Past Medical History:  Diagnosis Date   Abnormal pap 08/18/1998   h/o LEEP   Asthma    Contact lens/glasses fitting    wears contacts or glasses   Depression    and anxiety   Hemorrhoids, external    Hiatal hernia    IBS (irritable bowel syndrome)    questionable gluten allergy   Mitral valve prolapse 08/18/2004   echo 2013-no MVP-normal valve-no regurg   Schatzki's ring 08/18/2005   of esophagus    Past Surgical History:  Procedure Laterality Date   [place]  12/2005   ESOPHAGEAL DILATION  1/07   ESOPHAGEAL MANOMETRY N/A 07/23/2022   Procedure: ESOPHAGEAL MANOMETRY (EM);  Surgeon: Thornton Park, MD;  Location: [facility];  Service: Gastroenterology;  Laterality: N/A;   HEMORRHOID SURGERY N/A 03/01/2013   Procedure: HEMORRHOIDECTOMY;  Surgeon: Imogene Burn. Georgette Dover, MD;  Location: [facility];  Service: General;  Laterality: N/A;   LEEP  1999   lipoma removal  10/29/2011   abdominal wall   SKIN BIOPSY Left 03/01/2013   Procedure: SKIN BIOPSY  X 2  ABDOMINAL WALL;  Surgeon: Imogene Burn. Tsuei, MD;  Location: [facility];  Service: General;  Laterality: Left;   TUBAL LIGATION       Current Outpatient Medications:     albuterol (PROAIR HFA) 108 (90 Base) MCG/ACT inhaler, 2 puffs every 4 hours as needed for short of breath, wheezing or chest tightness, Disp: 18 g, Rfl: 1   benzonatate (TESSALON PERLES) 100 MG capsule, Take 1 capsule (100 mg total) by mouth 3 (three) times daily as needed for cough. (Patient not taking: Reported on 07/25/2022), Disp: 30 capsule, Rfl: 0   budesonide-formoterol (SYMBICORT) 160-4.5 MCG/ACT inhaler, Inhale 2 puffs into the lungs 2 (two) times daily. (Patient taking differently: Inhale 2 puffs into the lungs daily as needed (shortness of breath).), Disp: 1 each, Rfl: 0   citalopram (CELEXA) 20 MG tablet, Take 1 tablet (20 mg total) by mouth daily. (Patient taking differently: Take 20 mg by mouth daily as needed (anxiety).), Disp: 90 tablet, Rfl: 1   diphenhydrAMINE (BENADRYL) 25 MG tablet, Take 25 mg by mouth every 6 (six) hours as needed for allergies., Disp: , Rfl:    terconazole (TERAZOL 7) 0.4 % vaginal cream, Place 1 applicator vaginally at bedtime. (Patient not taking: Reported on 07/25/2022), Disp: 45 g, Rfl: 0  Allergies  Allergen Reactions   Amoxicillin Rash    Childhood allergy Allergic to the Grape flavor in liquid amoxicillin           Objective:  Physical  Exam  General: AAO x3, NAD  Dermatological: Skin is warm, dry and supple bilateral. There are no open sores, no preulcerative lesions, no rash or signs of infection present.  Vascular: Dorsalis Pedis artery and Posterior Tibial artery pedal pulses are 2/4 bilateral with immedate capillary fill time. There is no pain with calf compression, swelling, warmth, erythema.   Neruologic: Grossly intact via light touch bilateral.   Musculoskeletal: Status post first major arthrodesis which she is very happy with.  There is edema present left fifth toe about there is palpation of the fourth and fifth toe.  There is no pain on the metatarsals.  Flexor, extensor tendons appear to be intact.  Gait: Unassisted,  Nonantalgic.       Assessment:   Possible subacute fracture left fourth, fifth toes     Plan:  -Treatment options discussed including all alternatives, risks, and complications -Etiology of symptoms were discussed -X-rays were obtained and reviewed with the patient.  Radiolucency noted on the fourth and fifth phalanx concerning for possible old fracture.  No subacute fracture noted otherwise. -Given the swelling discomfort I showed her how to buddy splint the fourth and fifth toes together and throughout the toes have any residual swelling.  Discussed shoes with good support and stiffer soles shoes to avoid any excess pressure.  Ice, elevation. -If no improvement or resolution of this in the next couple weeks to let me know or sooner if there is any worsening or changes.  Trula Slade DPM

## 2022-07-25 ENCOUNTER — Other Ambulatory Visit (HOSPITAL_COMMUNITY): Payer: Self-pay | Admitting: *Deleted

## 2022-07-25 ENCOUNTER — Encounter (HOSPITAL_COMMUNITY): Payer: Self-pay | Admitting: Gastroenterology

## 2022-07-25 NOTE — Progress Notes (Signed)
Surgical Instructions    Your procedure is scheduled on Tuesday December 19th.  Report to [facility] Main Entrance "A" at 5:30 A.M., then check in with the Admitting office.  Call this number if you have problems the morning of surgery:  [phone]   If you have any questions prior to your surgery date call [phone]: Open Monday-Friday 8am-4pm If you experience any cold or flu symptoms such as cough, fever, chills, shortness of breath, etc. between now and your scheduled surgery, please notify us at the above number     Remember:  Do not eat after midnight the night before your surgery  You may drink clear liquids until 4:30am the morning of your surgery.   Clear liquids allowed are: Water, Non-Citrus Juices (without pulp), Carbonated Beverages, Clear Tea, Black Coffee ONLY (NO MILK, CREAM OR POWDERED CREAMER of any kind), and Gatorade   Enhanced Recovery after Surgery  Enhanced Recovery after Surgery is a protocol used to improve the stress on your body and your recovery after surgery.  Patient Instructions  The day of surgery (if you do NOT have diabetes):  Drink ONE (1) Pre-Surgery Clear Ensure by __4:30___ am the morning of surgery   This drink was given to you during your hospital  pre-op appointment visit. Nothing else to drink after completing the  Pre-Surgery Clear Ensure.          If you have questions, please contact your surgeon's office.     Take these medicines the morning of surgery with A SIP OF WATER: IF NEEDED Benadryl Albuterol - please bring with you Tessalon Perles Symbicort - please bring with you Celexa  As of today, STOP taking any Aspirin (unless otherwise instructed by your surgeon) Aleve, Naproxen, Ibuprofen, Motrin, Advil, Goody's, BC's, all herbal medications, fish oil, and all vitamins.           Do not wear jewelry or makeup. Do not wear lotions, powders, perfumes or deodorant. Do not shave 48 hours prior to surgery.   Do not bring  valuables to the hospital. Do not wear nail polish, gel polish, artificial nails, or any other type of covering on natural nails (fingers and toes) If you have artificial nails or gel coating that need to be removed by a nail salon, please have this removed prior to surgery. Artificial nails or gel coating may interfere with anesthesia's ability to adequately monitor your vital signs.  [facility] is not responsible for any belongings or valuables.    Do NOT Smoke (Tobacco/Vaping)  24 hours prior to your procedure  If you use a CPAP at night, you may bring your mask for your overnight stay.   Contacts, glasses, hearing aids, dentures or partials may not be worn into surgery, please bring cases for these belongings   For patients admitted to the hospital, discharge time will be determined by your treatment team.   Patients discharged the day of surgery will not be allowed to drive home, and someone needs to stay with them for 24 hours.   SURGICAL WAITING ROOM VISITATION Patients having surgery or a procedure may have no more than 2 support people in the waiting area - these visitors may rotate.   Children under the age of 24 must have an adult with them who is not the patient. If the patient needs to stay at the hospital during part of their recovery, the visitor guidelines for inpatient rooms apply. Pre-op nurse will coordinate an appropriate time for 1 support person  to accompany patient in pre-op.  This support person may not rotate.   Please refer to [email or web address] for the visitor guidelines for Inpatients (after your surgery is over and you are in a regular room).    Special instructions:    Oral Hygiene is also important to reduce your risk of infection.  Remember - BRUSH YOUR TEETH THE MORNING OF SURGERY WITH YOUR REGULAR TOOTHPASTE   [place]- Preparing For Surgery  Before surgery, you can play an important role.  Because skin is not sterile, your skin needs to be as free of germs as possible. You can reduce the number of germs on your skin by washing with CHG (chlorahexidine gluconate) Soap before surgery.  CHG is an antiseptic cleaner which kills germs and bonds with the skin to continue killing germs even after washing.     Please do not use if you have an allergy to CHG or antibacterial soaps. If your skin becomes reddened/irritated stop using the CHG.  Do not shave (including legs and underarms) for at least 48 hours prior to first CHG shower. It is OK to shave your face.  Please follow these instructions carefully.     Shower the NIGHT BEFORE SURGERY and the MORNING OF SURGERY with CHG Soap.   If you chose to wash your hair, wash your hair first as usual with your normal shampoo. After you shampoo, rinse your hair and body thoroughly to remove the shampoo.  Then [employer] and genitals (private parts) with your normal soap and rinse thoroughly to remove soap.  After that Use CHG Soap as you would any other liquid soap. You can apply CHG directly to the skin and wash gently with a scrungie or a clean washcloth.   Apply the CHG Soap to your body ONLY FROM THE NECK DOWN.  Do not use on open wounds or open sores. Avoid contact with your eyes, ears, mouth and genitals (private parts). Wash Face and genitals (private parts)  with your normal soap.   Wash thoroughly, paying special attention to the area where your surgery will be performed.  Thoroughly rinse your body with warm water from the neck down.  DO NOT shower/wash with your normal soap after using and rinsing off the CHG Soap.  Pat yourself dry with a CLEAN TOWEL.  Wear CLEAN PAJAMAS to bed the night before surgery  Place CLEAN SHEETS on your bed the night before your surgery  DO NOT SLEEP WITH PETS.   Day of Surgery:  Take a shower with CHG soap. Wear Clean/Comfortable clothing the morning of surgery Do not apply any  deodorants/lotions.   Remember to brush your teeth WITH YOUR REGULAR TOOTHPASTE.    If you received a COVID test during your pre-op visit, it is requested that you wear a mask when out in public, stay away from anyone that may not be feeling well, and notify your surgeon if you develop symptoms. If you have been in contact with anyone that has tested positive in the last 10 days, please notify your surgeon.    Please read over the following fact sheets that you were given.

## 2022-07-28 ENCOUNTER — Other Ambulatory Visit (HOSPITAL_COMMUNITY): Payer: Self-pay | Admitting: Urology

## 2022-07-28 ENCOUNTER — Encounter (HOSPITAL_COMMUNITY)
Admission: RE | Admit: 2022-07-28 | Discharge: 2022-07-28 | Disposition: A | Discharge status: Home / Self Care | Payer: BC Managed Care – PPO | Source: Ambulatory Visit | Attending: Anesthesiology | Admitting: Anesthesiology

## 2022-07-28 ENCOUNTER — Encounter (HOSPITAL_COMMUNITY): Payer: Self-pay

## 2022-07-28 ENCOUNTER — Other Ambulatory Visit: Payer: Self-pay

## 2022-07-28 VITALS — BP 135/81 | HR 79 | Temp 98.0°F | Resp 18 | Ht 68.0 in | Wt 170.5 lb

## 2022-07-28 DIAGNOSIS — Z01818 Encounter for other preprocedural examination: Secondary | ICD-10-CM | POA: Diagnosis not present

## 2022-07-28 DIAGNOSIS — I2510 Atherosclerotic heart disease of native coronary artery without angina pectoris: Secondary | ICD-10-CM

## 2022-07-28 DIAGNOSIS — R9431 Abnormal electrocardiogram [ECG] [EKG]: Secondary | ICD-10-CM | POA: Diagnosis not present

## 2022-07-28 HISTORY — DX: Personal history of other specified conditions: Z87.898

## 2022-07-28 LAB — CBC
HCT: 39.8 % (ref 36.0–46.0)
Hemoglobin: 12 g/dL (ref 12.0–15.0)
MCH: 24.9 pg — ABNORMAL LOW (ref 26.0–34.0)
MCHC: 30.2 g/dL (ref 30.0–36.0)
MCV: 82.7 fL (ref 80.0–100.0)
Platelets: 229 10*3/uL (ref 150–400)
RBC: 4.81 MIL/uL (ref 3.87–5.11)
RDW: 13.4 % (ref 11.5–15.5)
WBC: 6 10*3/uL (ref 4.0–10.5)
nRBC: 0 % (ref 0.0–0.2)

## 2022-07-28 LAB — BASIC METABOLIC PANEL
Anion gap: 7 (ref 5–15)
BUN: 11 mg/dL (ref 8–23)
CO2: 27 mmol/L (ref 22–32)
Calcium: 9.3 mg/dL (ref 8.9–10.3)
Chloride: 107 mmol/L (ref 98–111)
Creatinine, Ser: 0.7 mg/dL (ref 0.44–1.00)
GFR, Estimated: >60 mL/min (ref >60)
Glucose, Bld: 101 mg/dL — ABNORMAL HIGH (ref 70–99)
Potassium: 4.2 mmol/L (ref 3.5–5.1)
Sodium: 141 mmol/L (ref 135–145)

## 2022-07-28 NOTE — Progress Notes (Signed)
PCP - Clarise Cruz Early Cardiologist - Quay Burow- pt states that she was not instructed to get cardiac clearance prior to surgery. Pt denies recent palpitations within the last year. Pt states she has a history of Mitral Valve prolapse from when she was 57, but was told that she no longer has this issue.   PPM/ICD - denies  Chest x-ray - N/A EKG - 07/28/2022 Stress Test - pt reports having normal stress test "years ago" ECHO - 07/03/21 Cardiac Cath - denies  Sleep Study - denies  Blood Thinner Instructions: N/A Aspirin Instructions:N/A  ERAS Protcol -ERAS + ensure  COVID TEST- N/A   Anesthesia review: does pt need cardiac clearance prior to surgery? Pt states she is due to see Dr Gwenlyn Found for her yearly appointment, but has not scheduled yet.   Patient denies shortness of breath, fever, cough and chest pain at PAT appointment   All instructions explained to the patient, with a verbal understanding of the material. Patient agrees to go over the instructions while at home for a better understanding. Patient also instructed to self quarantine after being tested for COVID-19. The opportunity to ask questions was provided.

## 2022-07-30 NOTE — Progress Notes (Signed)
Anesthesia Chart Review:  Case: [id number] Date/Time: 08/05/22 0715   Procedure: ROBOTIC HIATAL HERNIA REPAIR WITH MESH AND FUNDOPLICATION   Anesthesia type: General   Pre-op diagnosis: HIATAL HERNIA   Location: [facility] ROOM 10 / [facility]   Surgeons: Ralene Ok, MD       DISCUSSION: Patient is a 61 year old female scheduled for the above procedure.   History includes never smoker, asthma, MVP (not seen on 07/03/21 echo), palpitations (post-COVID 07/2020 & in setting of stressful co-worker, none recent), IBS (gluten intolerance?), hiatal hernia, cholecystectomy (01/07/06), esophageal dilation (04/29/22).   She was referred to cardiologist Dr. Gwenlyn Found by pulmonologist Dr. Melvyn Novas in the Fall of 222 for palpitations and SOB. Palpitations noted post-COVID 07/2020. She had also reported previous history of MVP. She underwent a Zio monitor and echo. 05/2021 monitor showed sinus bradycardia-sinus tachycardia, occasional PACs/PVCs, short runs of SVT. 06/2021 echo showed LVEF 60-65%, no regional wall motion abnormalities, normal RVSF, possible mild myxomatous changes of the MV but no MVP, no MR or MS, mild-moderate TR. Her palpitations resolved after a stressful colleague left her workplace. She also had a coronary calcium score, last on 1/24/23t was 38 (81st percentile) and also showed a large hiatal hernia. She was started on statin therapy. He also referred her to GI. Last cardiology input is from [place], Isaac Laud, [place] on 10/30/21 prior to EGD and no additional cardiac testing recommended at that time. Statin was held in May due to side effects.   She had cardiology office evaluation within the past year and telephonic evaluation in March 2023. She denied any recent palpitations. Denied chest pain and SOB. EKG showed likely RA/RL lead reversal, but otherwise appears overall stable since last year.   Anesthesia team to evaluate on the day of surgery.   VS: BP 135/81   Pulse 79   Temp 36.7 C (Oral)   Resp 18   Ht 5'  8" (1.727 m)   Wt 77.3 kg   LMP 11/26/2011   SpO2 99%   BMI 25.92 kg/m   PROVIDERS: Early, Coralee Pesa, NP is PCP  Thornton Park, MD is GI Hale Bogus, MD is GYN Christinia Gully, MD is pulmonology. Last visit 01/18/21.   LABS: Labs reviewed: Acceptable for surgery. (all labs ordered are listed, but only abnormal results are displayed)  Labs Reviewed  BASIC METABOLIC PANEL - Abnormal; Notable for the following components:      Result Value   Glucose, Bld 101 (*)    All other components within normal limits  CBC - Abnormal; Notable for the following components:   MCH 24.9 (*)    All other components within normal limits    Spirometry 07/20/18: FVC 3.43 (92%). FEV1 2.41 (84%). Mild airway obstruction.   IMAGES: Barium Swallow Study 06/04/22: IMPRESSION: - Moderate hiatal hernia. 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 - Moderate esophageal dysmotility.  CXR 05/01/22: FINDINGS: Normal heart size and pulmonary vascularity. Large hiatal hernia. Lungs clear. No pulmonary infiltrate, pleural effusion, or pneumothorax. No pneumomediastinum. Osseous structures unremarkable. IMPRESSION: No acute abnormalities. Large hiatal hernia.   EKG: Normal sinus rhythm Low voltage QRS Cannot rule out Anterior infarct , age undetermined Abnormal ECG When compared with 05/24/21 EKG, poor anterior r wave progression appears stable. Lead II is no flat/very low voltage, suspect RA/RL lead reversal.    CV: CT Cardiac Scoring 09/10/21: FINDINGS: Coronary Calcium Score: Left main: 0 Left  anterior descending artery: 38 Left circumflex artery: 0 Right coronary artery: 0 Total: 38 Percentile: 81 Pericardium: Normal. Ascending Aorta: Normal caliber.  Mild aortic atherosclerosis. IMPRESSION: - Coronary calcium score of 38. This was 70 percentile for age-, race-, and sex-matched controls. - Mild aortic  atherosclerosis. RECOMMENDATIONS...  - If CAC is 1 to 39, it is reasonable to initiate statin therapy for patients >=39 years of age. - If CAC is >=100 or >=75th percentile, it is reasonable to initiate statin therapy at any age. - Cardiology referral should be considered for patients with CAC scores >=400 or >=75th percentile. (Comparison CT Cardiac Scoring 07/03/21: Coronary calcium score of 35. This was 81st percentile for age-, race-, and sex-matched controls.)    Echo 07/03/21: IMPRESSIONS   1. Left ventricular ejection fraction, by estimation, is 60 to 65%. The  left ventricle has normal function. The left ventricle has no regional  wall motion abnormalities. Left ventricular diastolic parameters were  normal.   2. Right ventricular systolic function is normal. The right ventricular  size is normal.   3. Possible mild myxomatous changes of the mitral valve, but prolapse is  not seen. The mitral valve is normal in structure. No evidence of mitral  valve regurgitation. No evidence of mitral stenosis.   4. Tricuspid valve regurgitation is mild to moderate.   5. The aortic valve is tricuspid. Aortic valve regurgitation is not  visualized. No aortic stenosis is present.   6. The inferior vena cava is normal in size with greater than 50%  respiratory variability, suggesting right atrial pressure of 3 mmHg.  - Comparison(s): Prior images unable to be directly viewed, comparison made  by report only.    Long term monitor 06/01/21-06/15/21: Patient had a min HR of 54 bpm, max HR of 203 bpm, and avg HR of 82 bpm. Predominant underlying rhythm was Sinus Rhythm. 24 Supraventricular Tachycardia runs occurred, the run with the fastest interval lasting 4 beats with a max rate of 203 bpm, the longest lasting 9.7 secs with an avg rate of 136 bpm. Supraventricular Tachycardia was detected within +/- 45 seconds of symptomatic patient event(s). Isolated SVEs were rare (<1.0%), SVE Couplets were  rare (<1.0%), and SVE Triplets were rare (<1.0%). Isolated VEs were rare (<1.0%), VE Couplets were rare (<1.0%), and no VE Triplets were present.   1. SR/SB/ST 2. Occasional PACs/PVCs 3. Short runs of SVT  Past Medical History:  Diagnosis Date   Abnormal pap 08/18/1998   h/o LEEP   Asthma    Contact lens/glasses fitting    wears contacts or glasses   Depression    and anxiety   Hemorrhoids, external    Hiatal hernia    History of palpitations    not currently   IBS (irritable bowel syndrome)    questionable gluten allergy   Mitral valve prolapse 08/18/2004   at 61 yo, echo 2013-no MVP-normal valve-no regurg   Schatzki's ring 08/18/2005   of esophagus    Past Surgical History:  Procedure Laterality Date   [place]  12/2005   ESOPHAGEAL DILATION  1/07   ESOPHAGEAL MANOMETRY N/A 07/23/2022   Procedure: ESOPHAGEAL MANOMETRY (EM);  Surgeon: Thornton Park, MD;  Location: [facility];  Service: Gastroenterology;  Laterality: N/A;   HEMORRHOID SURGERY N/A 03/01/2013   Procedure: HEMORRHOIDECTOMY;  Surgeon: Imogene Burn. Georgette Dover, MD;  Location: [facility];  Service: General;  Laterality: N/A;   LEEP  1999   lipoma removal  10/29/2011   abdominal wall   SKIN BIOPSY Left 03/01/2013   Procedure: SKIN BIOPSY  X 2  ABDOMINAL WALL;  Surgeon: Imogene Burn. Tsuei, MD;  Location: [facility];  Service: General;  Laterality: Left;   TUBAL LIGATION      MEDICATIONS:  albuterol (PROAIR HFA) 108 (90 Base) MCG/ACT inhaler   benzonatate (TESSALON PERLES) 100 MG capsule   budesonide-formoterol (SYMBICORT) 160-4.5 MCG/ACT inhaler   citalopram (CELEXA) 20 MG tablet   diphenhydrAMINE (BENADRYL) 25 MG tablet   terconazole (TERAZOL 7) 0.4 % vaginal cream   No current facility-administered medications for this encounter.    Myra Gianotti, PA-C Surgical Short Stay/Anesthesiology [facility] Phone [phone] [facility]  Phone [phone] 07/30/2022 1:32 PM

## 2022-07-30 NOTE — Anesthesia Preprocedure Evaluation (Addendum)
Anesthesia Evaluation  Patient identified by MRN, date of birth, ID band Patient awake    Reviewed: Allergy & Precautions, H&P , NPO status , Patient's Chart, lab work & pertinent test results  Airway Mallampati: II   Neck ROM: full    Dental   Pulmonary asthma    breath sounds clear to auscultation       Cardiovascular + Valvular Problems/Murmurs MVP  Rhythm:regular Rate:Normal     Neuro/Psych  PSYCHIATRIC DISORDERS  Depression       GI/Hepatic hiatal hernia,,,  Endo/Other    Renal/GU      Musculoskeletal   Abdominal   Peds  Hematology   Anesthesia Other Findings   Reproductive/Obstetrics                             Anesthesia Physical Anesthesia Plan  ASA: 2  Anesthesia Plan: General   Post-op Pain Management:    Induction: Intravenous  PONV Risk Score and Plan: 3 and Ondansetron, Dexamethasone, Midazolam and Treatment may vary due to age or medical condition  Airway Management Planned: Oral ETT  Additional Equipment:   Intra-op Plan:   Post-operative Plan: Extubation in OR  Informed Consent: I have reviewed the patients History and Physical, chart, labs and discussed the procedure including the risks, benefits and alternatives for the proposed anesthesia with the patient or authorized representative who has indicated his/her understanding and acceptance.     Dental advisory given  Plan Discussed with: CRNA, Anesthesiologist and Surgeon  Anesthesia Plan Comments: (PAT note written 07/30/2022 by Myra Gianotti, PA-C.  )       Anesthesia Quick Evaluation

## 2022-08-02 LAB — COLOGUARD

## 2022-08-05 ENCOUNTER — Ambulatory Visit (HOSPITAL_COMMUNITY): Payer: BC Managed Care – PPO | Admitting: Vascular Surgery

## 2022-08-05 ENCOUNTER — Encounter (HOSPITAL_COMMUNITY): Payer: Self-pay | Admitting: General Surgery

## 2022-08-05 ENCOUNTER — Ambulatory Visit (HOSPITAL_COMMUNITY): Payer: BC Managed Care – PPO | Admitting: Anesthesiology

## 2022-08-05 ENCOUNTER — Encounter (HOSPITAL_COMMUNITY)
Admission: RE | Disposition: A | Discharge status: Home / Self Care | Payer: Self-pay | Source: Home / Self Care | Attending: General Surgery

## 2022-08-05 ENCOUNTER — Observation Stay (HOSPITAL_COMMUNITY)
Admission: RE | Admit: 2022-08-05 | Discharge: 2022-08-06 | Disposition: A | Discharge status: Home / Self Care | Payer: BC Managed Care – PPO | Attending: General Surgery | Admitting: General Surgery

## 2022-08-05 ENCOUNTER — Other Ambulatory Visit: Payer: Self-pay

## 2022-08-05 DIAGNOSIS — K449 Diaphragmatic hernia without obstruction or gangrene: Secondary | ICD-10-CM | POA: Diagnosis not present

## 2022-08-05 DIAGNOSIS — Z79899 Other long term (current) drug therapy: Secondary | ICD-10-CM | POA: Diagnosis not present

## 2022-08-05 DIAGNOSIS — Z9889 Other specified postprocedural states: Secondary | ICD-10-CM | POA: Diagnosis present

## 2022-08-05 DIAGNOSIS — J45909 Unspecified asthma, uncomplicated: Secondary | ICD-10-CM | POA: Diagnosis not present

## 2022-08-05 DIAGNOSIS — Z8719 Personal history of other diseases of the digestive system: Secondary | ICD-10-CM

## 2022-08-05 HISTORY — PX: XI ROBOTIC ASSISTED HIATAL HERNIA REPAIR: SHX6889

## 2022-08-05 SURGERY — REPAIR, HERNIA, HIATAL, ROBOT-ASSISTED
Anesthesia: General

## 2022-08-05 MED ORDER — LACTATED RINGERS IV SOLN: INTRAVENOUS | Status: DC

## 2022-08-05 MED ORDER — FENTANYL CITRATE (PF) 250 MCG/5ML IJ SOLN
INTRAMUSCULAR | Status: AC
  Filled 2022-08-05: qty 5

## 2022-08-05 MED ORDER — DEXAMETHASONE SODIUM PHOSPHATE 10 MG/ML IJ SOLN
INTRAMUSCULAR | Status: AC
  Filled 2022-08-05: qty 1

## 2022-08-05 MED ORDER — OXYCODONE HCL 5 MG/5ML PO SOLN: 5.0000 mg | Freq: Once | ORAL | Status: DC | PRN

## 2022-08-05 MED ORDER — ACETAMINOPHEN 500 MG PO TABS
1000.0000 mg | ORAL_TABLET | ORAL | Status: AC
  Administered 2022-08-05: 1000 mg via ORAL
  Filled 2022-08-05: qty 2

## 2022-08-05 MED ORDER — BUPIVACAINE HCL (PF) 0.25 % IJ SOLN
INTRAMUSCULAR | Status: AC
  Filled 2022-08-05: qty 30

## 2022-08-05 MED ORDER — ENSURE PRE-SURGERY PO LIQD: 296.0000 mL | Freq: Once | ORAL | Status: DC

## 2022-08-05 MED ORDER — CHLORHEXIDINE GLUCONATE CLOTH 2 % EX PADS: 6.0000 | MEDICATED_PAD | Freq: Once | CUTANEOUS | Status: DC

## 2022-08-05 MED ORDER — EPHEDRINE 5 MG/ML INJ
INTRAVENOUS | Status: AC
  Filled 2022-08-05: qty 5

## 2022-08-05 MED ORDER — ONDANSETRON HCL 4 MG/2ML IJ SOLN: 4.0000 mg | Freq: Four times a day (QID) | INTRAMUSCULAR | Status: DC | PRN

## 2022-08-05 MED ORDER — ONDANSETRON 4 MG PO TBDP: 4.0000 mg | ORAL_TABLET | Freq: Four times a day (QID) | ORAL | Status: DC | PRN

## 2022-08-05 MED ORDER — LACTATED RINGERS IV SOLN: INTRAVENOUS | Status: DC | PRN

## 2022-08-05 MED ORDER — ROCURONIUM BROMIDE 10 MG/ML (PF) SYRINGE
PREFILLED_SYRINGE | INTRAVENOUS | Status: DC | PRN
  Administered 2022-08-05: 20 mg via INTRAVENOUS
  Administered 2022-08-05: 60 mg via INTRAVENOUS

## 2022-08-05 MED ORDER — DEXTROSE-NACL 5-0.9 % IV SOLN: INTRAVENOUS | Status: DC

## 2022-08-05 MED ORDER — PROPOFOL 10 MG/ML IV BOLUS
INTRAVENOUS | Status: AC
  Filled 2022-08-05: qty 20

## 2022-08-05 MED ORDER — EPHEDRINE SULFATE-NACL 50-0.9 MG/10ML-% IV SOSY
PREFILLED_SYRINGE | INTRAVENOUS | Status: DC | PRN
  Administered 2022-08-05: 5 mg via INTRAVENOUS

## 2022-08-05 MED ORDER — MIDAZOLAM HCL 2 MG/2ML IJ SOLN
INTRAMUSCULAR | Status: DC | PRN
  Administered 2022-08-05: 2 mg via INTRAVENOUS

## 2022-08-05 MED ORDER — SODIUM CHLORIDE 0.9 % IV SOLN
INTRAVENOUS | Status: DC | PRN
  Administered 2022-08-05: 40 mL

## 2022-08-05 MED ORDER — FENTANYL CITRATE (PF) 100 MCG/2ML IJ SOLN
25.0000 ug | INTRAMUSCULAR | Status: DC | PRN
  Administered 2022-08-05: 50 ug via INTRAVENOUS

## 2022-08-05 MED ORDER — LIDOCAINE 2% (20 MG/ML) 5 ML SYRINGE
INTRAMUSCULAR | Status: DC | PRN
  Administered 2022-08-05: 60 mg via INTRAVENOUS

## 2022-08-05 MED ORDER — SUGAMMADEX SODIUM 200 MG/2ML IV SOLN
INTRAVENOUS | Status: DC | PRN
  Administered 2022-08-05: 200 mg via INTRAVENOUS

## 2022-08-05 MED ORDER — MIDAZOLAM HCL 2 MG/2ML IJ SOLN
INTRAMUSCULAR | Status: AC
  Filled 2022-08-05: qty 2

## 2022-08-05 MED ORDER — ONDANSETRON HCL 4 MG/2ML IJ SOLN
INTRAMUSCULAR | Status: DC | PRN
  Administered 2022-08-05: 4 mg via INTRAVENOUS

## 2022-08-05 MED ORDER — FENTANYL CITRATE (PF) 250 MCG/5ML IJ SOLN
INTRAMUSCULAR | Status: DC | PRN
  Administered 2022-08-05 (×2): 50 ug via INTRAVENOUS
  Administered 2022-08-05: 100 ug via INTRAVENOUS

## 2022-08-05 MED ORDER — FENTANYL CITRATE (PF) 100 MCG/2ML IJ SOLN
INTRAMUSCULAR | Status: AC
  Filled 2022-08-05: qty 2

## 2022-08-05 MED ORDER — VANCOMYCIN HCL IN DEXTROSE 1-5 GM/200ML-% IV SOLN
1000.0000 mg | INTRAVENOUS | Status: AC
  Administered 2022-08-05: 1000 mg via INTRAVENOUS
  Filled 2022-08-05: qty 200

## 2022-08-05 MED ORDER — BUPIVACAINE HCL (PF) 0.25 % IJ SOLN
INTRAMUSCULAR | Status: DC | PRN
  Administered 2022-08-05: 20 mL

## 2022-08-05 MED ORDER — ORAL CARE MOUTH RINSE: 15.0000 mL | Freq: Once | OROMUCOSAL | Status: AC

## 2022-08-05 MED ORDER — HYDROCODONE-ACETAMINOPHEN 7.5-325 MG/15ML PO SOLN
10.0000 mL | ORAL | Status: DC | PRN
  Administered 2022-08-05 – 2022-08-06 (×2): 10 mL via ORAL
  Filled 2022-08-05 (×2): qty 15

## 2022-08-05 MED ORDER — ROCURONIUM BROMIDE 10 MG/ML (PF) SYRINGE
PREFILLED_SYRINGE | INTRAVENOUS | Status: AC
  Filled 2022-08-05: qty 10

## 2022-08-05 MED ORDER — PHENYLEPHRINE HCL-NACL 20-0.9 MG/250ML-% IV SOLN
INTRAVENOUS | Status: DC | PRN
  Administered 2022-08-05: 20 ug/min via INTRAVENOUS

## 2022-08-05 MED ORDER — ONDANSETRON HCL 4 MG/2ML IJ SOLN
INTRAMUSCULAR | Status: AC
  Filled 2022-08-05: qty 2

## 2022-08-05 MED ORDER — HYDROMORPHONE HCL 1 MG/ML IJ SOLN
1.0000 mg | INTRAMUSCULAR | Status: DC | PRN
  Administered 2022-08-05 (×2): 1 mg via INTRAVENOUS
  Filled 2022-08-05 (×2): qty 1

## 2022-08-05 MED ORDER — LIDOCAINE 2% (20 MG/ML) 5 ML SYRINGE
INTRAMUSCULAR | Status: AC
  Filled 2022-08-05: qty 5

## 2022-08-05 MED ORDER — BUPIVACAINE LIPOSOME 1.3 % IJ SUSP
INTRAMUSCULAR | Status: AC
  Filled 2022-08-05: qty 20

## 2022-08-05 MED ORDER — CHLORHEXIDINE GLUCONATE 0.12 % MT SOLN
15.0000 mL | Freq: Once | OROMUCOSAL | Status: AC
  Administered 2022-08-05: 15 mL via OROMUCOSAL
  Filled 2022-08-05: qty 15

## 2022-08-05 MED ORDER — OXYCODONE HCL 5 MG PO TABS: 5.0000 mg | ORAL_TABLET | Freq: Once | ORAL | Status: DC | PRN

## 2022-08-05 MED ORDER — 0.9 % SODIUM CHLORIDE (POUR BTL) OPTIME
TOPICAL | Status: DC | PRN
  Administered 2022-08-05: 1000 mL

## 2022-08-05 MED ORDER — PROPOFOL 10 MG/ML IV BOLUS
INTRAVENOUS | Status: DC | PRN
  Administered 2022-08-05: 160 mg via INTRAVENOUS

## 2022-08-05 MED ORDER — DEXAMETHASONE SODIUM PHOSPHATE 10 MG/ML IJ SOLN
INTRAMUSCULAR | Status: DC | PRN
  Administered 2022-08-05: 5 mg via INTRAVENOUS

## 2022-08-05 SURGICAL SUPPLY — 68 items
ADH SKN CLS APL DERMABOND .7 (GAUZE/BANDAGES/DRESSINGS) ×1
APL PRP STRL LF DISP 70% ISPRP (MISCELLANEOUS) ×1
APPLIER CLIP 5 13 M/L LIGAMAX5 (MISCELLANEOUS)
APR CLP MED LRG 5 ANG JAW (MISCELLANEOUS)
CANNULA REDUC XI 12-8 STAPL (CANNULA) ×1
CANNULA REDUCER 12-8 DVNC XI (CANNULA) ×1 IMPLANT
CHLORAPREP W/TINT 26 (MISCELLANEOUS) ×1 IMPLANT
CLIP APPLIE 5 13 M/L LIGAMAX5 (MISCELLANEOUS) IMPLANT
COVER MAYO STAND STRL (DRAPES) ×1 IMPLANT
COVER SURGICAL LIGHT HANDLE (MISCELLANEOUS) ×1 IMPLANT
COVER TIP SHEARS 8 DVNC (MISCELLANEOUS) IMPLANT
COVER TIP SHEARS 8MM DA VINCI (MISCELLANEOUS)
DEFOGGER SCOPE WARMER CLEARIFY (MISCELLANEOUS) ×1 IMPLANT
DERMABOND ADVANCED .7 DNX12 (GAUZE/BANDAGES/DRESSINGS) ×1 IMPLANT
DEVICE TROCAR PUNCTURE CLOSURE (ENDOMECHANICALS) ×1 IMPLANT
DRAIN PENROSE 0.5X18 (DRAIN) IMPLANT
DRAPE ARM DVNC X/XI (DISPOSABLE) ×4 IMPLANT
DRAPE CARDIOVASC SPLIT 88X140 (DRAPES) ×1 IMPLANT
DRAPE COLUMN DVNC XI (DISPOSABLE) ×1 IMPLANT
DRAPE DA VINCI XI ARM (DISPOSABLE) ×4
DRAPE DA VINCI XI COLUMN (DISPOSABLE) ×1
DRAPE ORTHO SPLIT 77X108 STRL (DRAPES) ×1
DRAPE SURG ORHT 6 SPLT 77X108 (DRAPES) ×1 IMPLANT
ELECT REM PT RETURN 9FT ADLT (ELECTROSURGICAL) ×1
ELECTRODE REM PT RTRN 9FT ADLT (ELECTROSURGICAL) ×1 IMPLANT
GLOVE BIO SURGEON STRL SZ7.5 (GLOVE) ×2 IMPLANT
GLOVE SURG SYN 7.5  E (GLOVE) ×3
GLOVE SURG SYN 7.5 E (GLOVE) ×3 IMPLANT
GLOVE SURG SYN 7.5 PF PI (GLOVE) ×3 IMPLANT
GOWN STRL REUS W/ TWL LRG LVL3 (GOWN DISPOSABLE) ×1 IMPLANT
GOWN STRL REUS W/ TWL XL LVL3 (GOWN DISPOSABLE) ×2 IMPLANT
GOWN STRL REUS W/TWL 2XL LVL3 (GOWN DISPOSABLE) ×1 IMPLANT
GOWN STRL REUS W/TWL LRG LVL3 (GOWN DISPOSABLE) ×1
GOWN STRL REUS W/TWL XL LVL3 (GOWN DISPOSABLE) ×2
KIT BASIN OR (CUSTOM PROCEDURE TRAY) ×1 IMPLANT
KIT TURNOVER KIT B (KITS) IMPLANT
MARKER SKIN DUAL TIP RULER LAB (MISCELLANEOUS) ×1 IMPLANT
MESH BIO-A 7X10 SYN MAT (Mesh General) ×1 IMPLANT
NDL 22X1.5 STRL (OR ONLY) (MISCELLANEOUS) ×1 IMPLANT
NDL INSUFFLATION 14GA 120MM (NEEDLE) ×1 IMPLANT
NEEDLE 22X1.5 STRL (OR ONLY) (MISCELLANEOUS) ×1 IMPLANT
NEEDLE INSUFFLATION 14GA 120MM (NEEDLE) ×1 IMPLANT
OBTURATOR OPTICAL STANDARD 8MM (TROCAR)
OBTURATOR OPTICAL STND 8 DVNC (TROCAR)
OBTURATOR OPTICALSTD 8 DVNC (TROCAR) IMPLANT
PENCIL SMOKE EVACUATOR (MISCELLANEOUS) IMPLANT
SCISSORS LAP 5X35 DISP (ENDOMECHANICALS) IMPLANT
SEAL CANN UNIV 5-8 DVNC XI (MISCELLANEOUS) ×3 IMPLANT
SEAL XI 5MM-8MM UNIVERSAL (MISCELLANEOUS) ×3
SEALER VESSEL DA VINCI XI (MISCELLANEOUS) ×1
SEALER VESSEL EXT DVNC XI (MISCELLANEOUS) ×1 IMPLANT
SET IRRIG TUBING LAPAROSCOPIC (IRRIGATION / IRRIGATOR) ×1 IMPLANT
SET TUBE SMOKE EVAC HIGH FLOW (TUBING) ×1 IMPLANT
SPIKE FLUID TRANSFER (MISCELLANEOUS) ×1 IMPLANT
STAPLER CANNULA SEAL DVNC XI (STAPLE) ×1 IMPLANT
STAPLER CANNULA SEAL XI (STAPLE) ×1
STAPLER VISISTAT 35W (STAPLE) IMPLANT
STOPCOCK 4 WAY LG BORE MALE ST (IV SETS) ×1 IMPLANT
SUT ETHIBOND 0 36 GRN (SUTURE) ×2 IMPLANT
SUT ETHIBOND 2 0 SH (SUTURE)
SUT ETHIBOND 2 0 SH 36X2 (SUTURE) IMPLANT
SUT MNCRL AB 4-0 PS2 18 (SUTURE) ×1 IMPLANT
SUT SILK 0 SH 30 (SUTURE) ×1 IMPLANT
SUT VICRYL 0 UR6 27IN ABS (SUTURE) ×1 IMPLANT
SYR 30ML SLIP (SYRINGE) ×1 IMPLANT
TRAY FOLEY MTR SLVR 16FR STAT (SET/KITS/TRAYS/PACK) ×1 IMPLANT
TRAY LAPAROSCOPIC MC (CUSTOM PROCEDURE TRAY) ×1 IMPLANT
TROCAR ADV FIXATION 5X100MM (TROCAR) ×1 IMPLANT

## 2022-08-05 NOTE — Discharge Instructions (Signed)
EATING AFTER YOUR ESOPHAGEAL SURGERY (Stomach Fundoplication, Hiatal Hernia repair, Achalasia surgery, etc)  ######################################################################  EAT Start with a pureed / full liquid diet (see below) Gradually transition to a high fiber diet with a fiber supplement over the next month after discharge.    WALK Walk an hour a day.  Control your pain to do that.    CONTROL PAIN Control pain so that you can walk, sleep, tolerate sneezing/coughing, go up/down stairs.  HAVE A BOWEL MOVEMENT DAILY Keep your bowels regular to avoid problems.  OK to try a laxative to override constipation.  OK to use an antidairrheal to slow down diarrhea.  Call if not better after 2 tries  CALL IF YOU HAVE PROBLEMS/CONCERNS Call if you are still struggling despite following these instructions. Call if you have concerns not answered by these instructions  ######################################################################   After your esophageal surgery, expect some sticking with swallowing over the next 1-2 months.    If food sticks when you eat, it is called "dysphagia".  This is due to swelling around your esophagus at the wrap & hiatal diaphragm repair.  It will gradually ease off over the next few months.  To help you through this temporary phase, we start you out on a pureed (blenderized) diet.  Your first meal in the hospital was thin liquids.  You should have been given a pureed diet by the time you left the hospital.  We ask patients to stay on a pureed diet for the first 2-3 weeks to avoid anything getting "stuck" near your recent surgery.  Don't be alarmed if your ability to swallow doesn't progress according to this plan.  Everyone is different and some diets can advance more or less quickly.    It is often helpful to crush your medications or split them as they can sometimes stick, especially the first week or so.   Some BASIC RULES to follow  are:  Maintain an upright position whenever eating or drinking.  Take small bites - just a teaspoon size bite at a time.  Eat slowly.  It may also help to eat only one food at a time.  Consider nibbling through smaller, more frequent meals & avoid the urge to eat BIG meals  Do not push through feelings of fullness, nausea, or bloatedness  Do not mix solid foods and liquids in the same mouthful  Try not to "wash foods down" with large gulps of liquids.  Avoid carbonated (bubbly/fizzy) drinks.    Avoid foods that make you feel gassy or bloated.  Start with bland foods first.  Wait on trying greasy, fried, or spicy meals until you are tolerating more bland solids well.  Understand that it will be hard to burp and belch at first.  This gradually improves with time.  Expect to be more gassy/flatulent/bloated initially.  Walking will help your body manage it better.  Consider using medications for bloating that contain simethicone such as  Maalox or Gas-X   Consider crushing her medications, especially smaller pills.  The ability to swallow pills should get easier after a few weeks  Eat in a relaxed atmosphere & minimize distractions.  Avoid talking while eating.    Do not use straws.  Following each meal, sit in an upright position (90 degree angle) for 60 to 90 minutes.  Going for a short walk can help as well  If food does stick, don't panic.  Try to relax and let the food pass on its own.    Sipping WARM LIQUID such as strong hot black tea can also help slide it down.   Be gradual in changes & use common sense:  -If you easily tolerating a certain "level" of foods, advance to the next level gradually -If you are having trouble swallowing a particular food, then avoid it.   -If food is sticking when you advance your diet, go back to thinner previous diet (the lower LEVEL) for 1-2 days.  LEVEL 1 = PUREED DIET  Do for the first 2 WEEKS AFTER SURGERY  -Foods in this group are  pureed or blenderized to a smooth, mashed potato-like consistency.  -If necessary, the pureed foods can keep their shape with the addition of a thickening agent.   -Meat should be pureed to a smooth, pasty consistency.  Hot broth or gravy may be added to the pureed meat, approximately 1 oz. of liquid per 3 oz. serving of meat. -CAUTION:  If any foods do not puree into a smooth consistency, swallowing will be more difficult.  (For example, nuts or seeds sometimes do not blend well.)  Hot Foods Cold Foods  Pureed scrambled eggs and cheese Pureed cottage cheese  Baby cereals Thickened juices and nectars  Thinned cooked cereals (no lumps) Thickened milk or eggnog  Pureed French toast or pancakes Ensure  Mashed potatoes Ice cream  Pureed parsley, au gratin, scalloped potatoes, candied sweet potatoes Fruit or Italian ice, sherbet  Pureed buttered or alfredo noodles Plain yogurt  Pureed vegetables (no corn or peas) Instant breakfast  Pureed soups and creamed soups Smooth pudding, mousse, custard  Pureed scalloped apples Whipped gelatin  Gravies Sugar, syrup, honey, jelly  Sauces, cheese, tomato, barbecue, white, creamed Cream  Any baby food Creamer  Alcohol in moderation (not beer or champagne) Margarine  Coffee or tea Mayonnaise   Ketchup, mustard   Apple sauce   SAMPLE MENU:  PUREED DIET Breakfast Lunch Dinner   Orange juice, 1/2 cup  Cream of wheat, 1/2 cup  Pineapple juice, 1/2 cup  Pureed turkey, barley soup, 3/4 cup  Pureed Hawaiian chicken, 3 oz   Scrambled eggs, mashed or blended with cheese, 1/2 cup  Tea or coffee, 1 cup   Whole milk, 1 cup   Non-dairy creamer, 2 Tbsp.  Mashed potatoes, 1/2 cup  Pureed cooled broccoli, 1/2 cup  Apple sauce, 1/2 cup  Coffee or tea  Mashed potatoes, 1/2 cup  Pureed spinach, 1/2 cup  Frozen yogurt, 1/2 cup  Tea or coffee      LEVEL 2 = SOFT DIET  After your first 2 weeks, you can advance to a soft diet.   Keep on this  diet until everything goes down easily.  Hot Foods Cold Foods  White fish Cottage cheese  Stuffed fish Junior baby fruit  Baby food meals Semi thickened juices  Minced soft cooked, scrambled, poached eggs nectars  Souffle & omelets Ripe mashed bananas  Cooked cereals Canned fruit, pineapple sauce, milk  potatoes Milkshake  Buttered or Alfredo noodles Custard  Cooked cooled vegetable Puddings, including tapioca  Sherbet Yogurt  Vegetable soup or alphabet soup Fruit ice, Italian ice  Gravies Whipped gelatin  Sugar, syrup, honey, jelly Junior baby desserts  Sauces:  Cheese, creamed, barbecue, tomato, white Cream  Coffee or tea Margarine   SAMPLE MENU:  LEVEL 2 Breakfast Lunch Dinner   Orange juice, 1/2 cup  Oatmeal, 1/2 cup  Scrambled eggs with cheese, 1/2 cup  Decaffeinated tea, 1 cup  Whole milk, 1 cup    Non-dairy creamer, 2 Tbsp  Pineapple juice, 1/2 cup  Minced beef, 3 oz  Gravy, 2 Tbsp  Mashed potatoes, 1/2 cup  Minced fresh broccoli, 1/2 cup  Applesauce, 1/2 cup  Coffee, 1 cup  Turkey, barley soup, 3/4 cup  Minced Hawaiian chicken, 3 oz  Mashed potatoes, 1/2 cup  Cooked spinach, 1/2 cup  Frozen yogurt, 1/2 cup  Non-dairy creamer, 2 Tbsp      LEVEL 3 = CHOPPED DIET  -After all the foods in level 2 (soft diet) are passing through well you should advance up to more chopped foods.  -It is still important to cut these foods into small pieces and eat slowly.  Hot Foods Cold Foods  Poultry Cottage cheese  Chopped Swedish meatballs Yogurt  Meat salads (ground or flaked meat) Milk  Flaked fish (tuna) Milkshakes  Poached or scrambled eggs Soft, cold, dry cereal  Souffles and omelets Fruit juices or nectars  Cooked cereals Chopped canned fruit  Chopped French toast or pancakes Canned fruit cocktail  Noodles or pasta (no rice) Pudding, mousse, custard  Cooked vegetables (no frozen peas, corn, or mixed vegetables) Green salad  Canned small sweet peas  Ice cream  Creamed soup or vegetable soup Fruit ice, Italian ice  Pureed vegetable soup or alphabet soup Non-dairy creamer  Ground scalloped apples Margarine  Gravies Mayonnaise  Sauces:  Cheese, creamed, barbecue, tomato, white Ketchup  Coffee or tea Mustard   SAMPLE MENU:  LEVEL 3 Breakfast Lunch Dinner   Orange juice, 1/2 cup  Oatmeal, 1/2 cup  Scrambled eggs with cheese, 1/2 cup  Decaffeinated tea, 1 cup  Whole milk, 1 cup  Non-dairy creamer, 2 Tbsp  Ketchup, 1 Tbsp  Margarine, 1 tsp  Salt, 1/4 tsp  Sugar, 2 tsp  Pineapple juice, 1/2 cup  Ground beef, 3 oz  Gravy, 2 Tbsp  Mashed potatoes, 1/2 cup  Cooked spinach, 1/2 cup  Applesauce, 1/2 cup  Decaffeinated coffee  Whole milk  Non-dairy creamer, 2 Tbsp  Margarine, 1 tsp  Salt, 1/4 tsp  Pureed turkey, barley soup, 3/4 cup  Barbecue chicken, 3 oz  Mashed potatoes, 1/2 cup  Ground fresh broccoli, 1/2 cup  Frozen yogurt, 1/2 cup  Decaffeinated tea, 1 cup  Non-dairy creamer, 2 Tbsp  Margarine, 1 tsp  Salt, 1/4 tsp  Sugar, 1 tsp    LEVEL 4:  REGULAR FOODS  -Foods in this group are soft, moist, regularly textured foods.   -This level includes meat and breads, which tend to be the hardest things to swallow.   -Eat very slowly, chew well and continue to avoid carbonated drinks. -most people are at this level in 4-6 weeks  Hot Foods Cold Foods  Baked fish or skinned Soft cheeses - cottage cheese  Souffles and omelets Cream cheese  Eggs Yogurt  Stuffed shells Milk  Spaghetti with meat sauce Milkshakes  Cooked cereal Cold dry cereals (no nuts, dried fruit, coconut)  French toast or pancakes Crackers  Buttered toast Fruit juices or nectars  Noodles or pasta (no rice) Canned fruit  Potatoes (all types) Ripe bananas  Soft, cooked vegetables (no corn, lima, or baked beans) Peeled, ripe, fresh fruit  Creamed soups or vegetable soup Cakes (no nuts, dried fruit, coconut)  Canned chicken  noodle soup Plain doughnuts  Gravies Ice cream  Bacon dressing Pudding, mousse, custard  Sauces:  Cheese, creamed, barbecue, tomato, white Fruit ice, Italian ice, sherbet  Decaffeinated tea or coffee Whipped gelatin  Pork chops Regular gelatin     Canned fruited gelatin molds   Sugar, syrup, honey, jam, jelly   Cream   Non-dairy   Margarine   Oil   Mayonnaise   Ketchup   Mustard   TROUBLESHOOTING IRREGULAR BOWELS  1) Avoid extremes of bowel movements (no bad constipation/diarrhea)  2) Miralax 17gm mixed in 8oz. water or juice-daily. May use BID as needed.  3) Gas-x,Phazyme, etc. as needed for gas & bloating.  4) Soft,bland diet. No spicy,greasy,fried foods.  5) Prilosec over-the-counter as needed  6) May hold gluten/wheat products from diet to see if symptoms improve.  7) May try probiotics (Align, Activa, etc) to help calm the bowels down  7) If symptoms become worse call back immediately.    If you have any questions please call our office at [facility]: [phone].  

## 2022-08-05 NOTE — Op Note (Signed)
08/05/2022  9:18 AM  PATIENT:  Nicole Fleming  61 y.o. female  PRE-OPERATIVE DIAGNOSIS:  HIATAL HERNIA  POST-OPERATIVE DIAGNOSIS:  HIATAL HERNIA  PROCEDURE:  Procedure(s): ROBOTIC HIATAL HERNIA REPAIR WITH MESH AND TOUPET FUNDOPLICATION (N/A)  SURGEON:  Surgeon(s) and Role:    Ralene Ok, MD - Primary  ASSISTANTS: Pryor Curia, RNFA   ANESTHESIA:   local and general  EBL:  minimal   BLOOD ADMINISTERED:none  DRAINS: none   LOCAL MEDICATIONS USED:  BUPIVICAINE   SPECIMEN:  No Specimen  DISPOSITION OF SPECIMEN:  N/A  COUNTS:  YES  TOURNIQUET:  * No tourniquets in log *  DICTATION: .Dragon Dictation The patient was taken back to the operating room and placed in the supine position with bilateral SCDs in place. The patient was prepped and draped in the usual sterile fashion. After appropriate antibiotics were confirmed a timeout was called and all facts were verified.   A Veress needle technique was used to insufflate the abdomen to 15 mm of mercury the paramedian stab incision. Subsequent to this an 8 mm trocar was introduced as was a 8 millimeter camera. At this time the subsequent robotic trochars x3, were then placed adjacent to this trocar approximately 8-10 cm away. Each trocar was inserted under direct visualization, there were total of 4 trochars. A 57m trocar was placed in the midclavicular line.  A 0vicryl was placed to help with closure at the end of the case.The assistant trocar was then placed in the right lower quadrant under direct visualization. The Nathanson retractor was then visualized inserted into the abdomen and the incision just to the left of the falciform ligament. This was then placed to retract the liver appropriately. At this time the patient was positioned in reverse Trendelenburg.   At this time the robot patient cart was brought to the bedside and placed in good position and the arms were docked to the trochars appropriately. At this time  I proceeded to incised the gastrohepatic ligament.  At this time I proceeded to mobilize the stomach inferiorly and visualize the right crus. The peritoneum over the right crus was incised and right crus was identified. I proceeded to dissect this inferiorly until the left crus was seen joining the right crus. Once the right crus was adequately dissected we turned our to the left crus which was dissected away. This required traction of the stomach to the right side. Once this was visualized we then proceeded to circumferentially dissect the esophagus away from the surrounding tissue. The anterior and posterior vagus was seen along the esophagus at the GE junction.  These were both preserved throughout the entire case.  At this time the phrenoesophageal fat pad was dissected away from the esophagus. There was a moderate-sized hiatal hernia seen. I mobilized the esophagus cephalad approximately 4-5 cm, clearing away the surrounding tissue. The anterior hernia sac was dissected away from the stomach and esophagus.  At this time we turned our attention to the greater curvature the stomach and the omentum was mobilized using the robotic vessel sealer. This was taken up to the greater curvature to the hiatus. This mobilized the entire greater curvature to allow mobilization and the wrap. I then proceeded to bring the greater curvature the stomach posterior to the esophagus, and a shoeshine technique was used to evaluate the mobilization of the greater curvature.   At this time I proceeded to close the hiatus using interrupted 0 Ethibonds x 4. This brought together the hiatal  closure without undue stricture to the esophagus.   A piece of Gore Bio A hiatal mesh was placed over the hiatal closure and sutured to the crus using 0 Ethibonds sutures x 3.  At this time the greater curvature was brought around the esophagus and sutured using 0 silk sutures interrupted fashion approximately 1 cm apart x3 on each side of the  esophagus in a Toupet fashion. A left collar stitch was then used to gastropexy the stomach from the wrap to the diaphragm just lateral to the left crus as.  A second collar stitch was placed from the wrap to the right crus.  The wrap lay loose with no strangulation of the esophagus.  At this time the robot was undocked. The liver trocar was removed. At this time insufflation was evacuated. Skin was reapproximated for Monocryl subcuticular fashion. The skin was then dressed with Dermabond. The patient tolerated the procedure well and was taken to the recovery room in stable condition.   PLAN OF CARE: Admit for overnight observation  PATIENT DISPOSITION:  PACU - hemodynamically stable.   Delay start of Pharmacological VTE agent (>24hrs) due to surgical blood loss or risk of bleeding: not applicable

## 2022-08-05 NOTE — Anesthesia Procedure Notes (Signed)
Procedure Name: Intubation Date/Time: 08/05/2022 7:40 AM  Performed by: Reece Agar, CRNAPre-anesthesia Checklist: Patient identified, Emergency Drugs available, Suction available and Patient being monitored Patient Re-evaluated:Patient Re-evaluated prior to induction Oxygen Delivery Method: Circle System Utilized Preoxygenation: Pre-oxygenation with 100% oxygen Induction Type: IV induction Ventilation: Mask ventilation without difficulty Laryngoscope Size: Mac and 3 Grade View: Grade I Tube type: Oral Tube size: 7.0 mm Number of attempts: 1 Airway Equipment and Method: Stylet Placement Confirmation: ETT inserted through vocal cords under direct vision, positive ETCO2 and breath sounds checked- equal and bilateral Secured at: 21 cm Tube secured with: Tape Dental Injury: Teeth and Oropharynx as per pre-operative assessment

## 2022-08-05 NOTE — Transfer of Care (Signed)
Immediate Anesthesia Transfer of Care Note  Patient: Nicole Fleming  Procedure(s) Performed: ROBOTIC HIATAL HERNIA REPAIR WITH MESH AND FUNDOPLICATION  Patient Location: PACU  Anesthesia Type:General  Level of Consciousness: drowsy  Airway & Oxygen Therapy: Patient Spontanous Breathing and Patient connected to face mask oxygen  Post-op Assessment: Report given to RN and Post -op Vital signs reviewed and stable  Post vital signs: Reviewed and stable  Last Vitals:  Vitals Value Taken Time  BP 143/84 08/05/22 0945  Temp    Pulse 77 08/05/22 0947  Resp 14 08/05/22 0947  SpO2 98 % 08/05/22 0947  Vitals shown include unvalidated device data.  Last Pain:  Vitals:   08/05/22 0622  TempSrc:   PainSc: 0-No pain      Patients Stated Pain Goal: 0 (47/18/55 0158)  Complications: No notable events documented.

## 2022-08-05 NOTE — H&P (Signed)
Chief Complaint: New Patient (Eval of hiatal hernia )       History of Present Illness: Nicole Fleming is a 61 y.o. female who is seen today as an office consultation at the request of Dr. Sabra Heck for evaluation of New Patient (Eval of hiatal hernia ) .   Patient is a 61 year old female who comes in secondary to a large hiatal hernia. Patient states that she had this diagnosed approximately a year ago.  This was on a chest CT scan.  Patient was at that time encouraged to follow-up for work-up for hiatal hernia. I discussed with the patient she has had some dysphagia with solids and sometimes liquids.  She states that foods that are more bolus type foods to include bread and meats she has difficulty with swallowing.  Patient denies any reflux however she does state that she has had a chronic cough as well as a sore throat after the last endoscopy.  She denies any regurgitation of any on digested food or bile.   Patient did have a CT scan which a reviewed personally which shows a large hiatal hernia.  Patient has also undergone endoscopy which I reviewed.   Patient was set to be scheduled for manometry however she has not had this done yet.   She did have an upper GI which I reviewed this did show some spasm of the esophagus.   She had previous lap chole by Dr. Wayne Both in the past       Review of Systems: A complete review of systems was obtained from the patient.  I have reviewed this information and discussed as appropriate with the patient.  See HPI as well for other ROS.   Review of Systems  Constitutional:  Negative for fever.  HENT:  Negative for congestion.   Eyes:  Negative for blurred vision.  Respiratory:  Negative for cough, shortness of breath and wheezing.   Cardiovascular:  Negative for chest pain and palpitations.  Gastrointestinal:  Positive for abdominal pain. Negative for heartburn.  Genitourinary:  Negative for dysuria.  Musculoskeletal:  Negative for myalgias.  Skin:   Negative for rash.  Neurological:  Negative for dizziness and headaches.  Psychiatric/Behavioral:  Negative for depression and suicidal ideas.   All other systems reviewed and are negative.       Medical History: Past Medical History Past Medical History: Diagnosis        Date            Asthma                         Hyperlipidemia                    There is no problem list on file for this patient.     Past Surgical History Past Surgical History: Procedure       Laterality         Date            ESOPHAGEAL DILATION                 08/2005            CHOLECYSTECTOMY                      12/2005            lipoma removal  10/29/2011            Hemmorhoidectomy                03/01/2013             Dr. Bryson Ha SECTION                                     [phone]            Tubal ligation                       Allergies Allergies Allergen           Reactions            Amoxicillin       Rash                         Allergic to the Grape flavor in liquid amoxicillin       Current Outpatient Medications on File Prior to Visit Medication       Sig       Dispense         Refill            citalopram (CELEXA) 20 MG tablet    Take 20 mg by mouth once daily                                pantoprazole (PROTONIX) 40 MG DR tablet                                        No current facility-administered medications on file prior to visit.     Family History History reviewed. No pertinent family history.     Social History   Tobacco Use Smoking Status           Never Smokeless Tobacco   Never     Social History Social History     Socioeconomic History            Marital status:  Single Tobacco Use            Smoking status:          Never            Smokeless tobacco:    Never Substance and Sexual Activity            Alcohol use:    Never            Drug use:        Never       Objective:     BP 119/81   Pulse 73    Temp 97.8 F (36.6 C) (Oral)   Resp 17   Ht '5\' 8"'$  (1.727 m)   Wt 72.6 kg   LMP 11/26/2011   SpO2 96%   BMI 24.33 kg/m     Body mass index is 26.06 kg/m.   Physical Exam Constitutional:      Appearance: Normal appearance.  HENT:     Head: Normocephalic and atraumatic.     Mouth/Throat:     Mouth: Mucous membranes are moist.  Pharynx: Oropharynx is clear.  Eyes:     General: No scleral icterus.    Pupils: Pupils are equal, round, and reactive to light.  Cardiovascular:     Rate and Rhythm: Normal rate and regular rhythm.     Pulses: Normal pulses.     Heart sounds: No murmur heard.    No friction rub. No gallop.  Pulmonary:     Effort: Pulmonary effort is normal. No respiratory distress.     Breath sounds: Normal breath sounds. No stridor.  Abdominal:     General: Abdomen is flat.  Musculoskeletal:        General: No swelling.  Skin:    General: Skin is warm.  Neurological:     General: No focal deficit present.     Mental Status: She is alert and oriented to person, place, and time. Mental status is at baseline.  Psychiatric:        Mood and Affect: Mood normal.        Thought Content: Thought content normal.        Judgment: Judgment normal.            Assessment and Plan: Diagnoses and all orders for this visit:   Hiatal hernia     Nicole Fleming is a 61 y.o. female    Patient with large hiatal hernia.  I do believe that she would benefit from having manometry testing.  I did discuss with her if this appeared normal we will proceed with robotic hiatal hernia repair and toupee fundoplication.  If she does have any positive findings with manometry to diagnose achalasia we would change surgery.  I would call her at that point to discuss the next steps.       1.          We will proceed to the OR for a robotic hiatal hernia repair with mesh and fundoplication 2.         All risks and benefits were discussed with the patient, to generally include  infection, bleeding, damage to surrounding structures, possible pneumothorax, and recurrence. Alternatives were offered and described.  All questions were answered and the patient voiced understanding of the procedure and wishes to proceed at this point.

## 2022-08-06 ENCOUNTER — Encounter (HOSPITAL_COMMUNITY): Payer: Self-pay | Admitting: General Surgery

## 2022-08-06 ENCOUNTER — Observation Stay (HOSPITAL_COMMUNITY): Payer: BC Managed Care – PPO

## 2022-08-06 DIAGNOSIS — K449 Diaphragmatic hernia without obstruction or gangrene: Secondary | ICD-10-CM | POA: Diagnosis not present

## 2022-08-06 MED ORDER — IOHEXOL 300 MG/ML  SOLN
100.0000 mL | Freq: Once | INTRAMUSCULAR | Status: AC | PRN
  Administered 2022-08-06: 60 mL via ORAL

## 2022-08-06 MED ORDER — HYDROCODONE-ACETAMINOPHEN 7.5-325 MG/15ML PO SOLN: 10.0000 mL | ORAL | 0 refills | Status: DC | PRN

## 2022-08-06 NOTE — Anesthesia Postprocedure Evaluation (Signed)
Anesthesia Post Note  Patient: Nicole Fleming  Procedure(s) Performed: ROBOTIC HIATAL HERNIA REPAIR WITH MESH AND FUNDOPLICATION     Patient location during evaluation: PACU Anesthesia Type: General Level of consciousness: awake and alert Pain management: pain level controlled Vital Signs Assessment: post-procedure vital signs reviewed and stable Respiratory status: spontaneous breathing, nonlabored ventilation, respiratory function stable and patient connected to nasal cannula oxygen Cardiovascular status: blood pressure returned to baseline and stable Postop Assessment: no apparent nausea or vomiting Anesthetic complications: no   No notable events documented.  Last Vitals:  Vitals:   08/06/22 0501 08/06/22 0724  BP: 129/77 124/81  Pulse: 75 83  Resp:  16  Temp: 36.7 C 36.9 C  SpO2: 96% 94%    Last Pain:  Vitals:   08/06/22 0957  TempSrc:   PainSc: [place]

## 2022-08-06 NOTE — Plan of Care (Signed)

## 2022-08-06 NOTE — Discharge Summary (Signed)
Physician Discharge Summary  Patient ID: Nicole Fleming MRN: [id number] DOB/AGE: 1961/06/19 61 y.o.  Admit date: 08/05/2022 Discharge date: 08/06/2022  Admission Diagnoses:s/p hiatal hernia repair  Discharge Diagnoses:  Principal Problem:   Hiatal hernia Active Problems:   S/P Nissen fundoplication (without gastrostomy tube) procedure   Discharged Condition: good  Hospital Course: Pt admitted post op seen op note for full details. Pt did well . Tol clears and had an esophagram.  Pt with normal findings.  She was started on a pureed diet.  She had good pain control, was deemed stable for Dc and  was dc'd home.  Consults: None  Significant Diagnostic Studies: DG esopg with no leak  Treatments: surgery: as above  Discharge Exam: Blood pressure 124/81, pulse 83, temperature 98.5 F (36.9 C), temperature source Oral, resp. rate 16, height '5\' 8"'$  (1.727 m), weight 72.6 kg, last menstrual period 11/26/2011, SpO2 94 %. General appearance: alert GI: soft, non-tender; bowel sounds normal; no masses,  no organomegaly and inc c/d/i  Disposition: Discharge disposition: 01-Home or Self Care       Discharge Instructions     Diet - low sodium heart healthy   Complete by: As directed    Increase activity slowly   Complete by: As directed       Allergies as of 08/06/2022       Reactions   Amoxicillin Rash   Childhood allergy Allergic to the Grape flavor in liquid amoxicillin        Medication List     TAKE these medications    albuterol 108 (90 Base) MCG/ACT inhaler Commonly known as: ProAir HFA 2 puffs every 4 hours as needed for short of breath, wheezing or chest tightness   benzonatate 100 MG capsule Commonly known as: Tessalon Perles Take 1 capsule (100 mg total) by mouth 3 (three) times daily as needed for cough.   budesonide-formoterol 160-4.5 MCG/ACT inhaler Commonly known as: Symbicort Inhale 2 puffs into the lungs 2 (two) times daily. What changed:   when to take this reasons to take this   citalopram 20 MG tablet Commonly known as: CELEXA Take 1 tablet (20 mg total) by mouth daily. What changed:  when to take this reasons to take this   diphenhydrAMINE 25 MG tablet Commonly known as: BENADRYL Take 25 mg by mouth every 6 (six) hours as needed for allergies.   HYDROcodone-acetaminophen 7.5-325 mg/15 ml solution Commonly known as: HYCET Take 10 mLs by mouth every 4 (four) hours as needed for moderate pain.   terconazole 0.4 % vaginal cream Commonly known as: TERAZOL 7 Place 1 applicator vaginally at bedtime.        Follow-up Information     Ralene Ok, MD. Schedule an appointment as soon as possible for a visit in 3 week(s).   Specialty: General Surgery Contact information: [place] [address] [place] [phone]                 Signed: Ralene Ok 08/06/2022, 10:33 AM

## 2022-08-06 NOTE — Progress Notes (Signed)
Nutrition Brief Note  RD consult received for pureed diet x 2 weeks. Pt is s/p Nissan fundoplication (without gastrostomy tube).   Spoke with pt at bedside. She reports feeling well today. Provided handout on nutrition recommendations and foods to avoid until diet advancement approved by MD.  Provided Ensure and Boost Breeze for patient to try as well as coupons to help with purchasing after discharge.   All questions were addressed.   Clayborne Dana, RDN, LDN Clinical Nutrition

## 2022-08-06 NOTE — Progress Notes (Signed)
Discharge instructions (including medications) discussed with and copy provided to patient/caregiver 

## 2022-08-28 ENCOUNTER — Ambulatory Visit (HOSPITAL_BASED_OUTPATIENT_CLINIC_OR_DEPARTMENT_OTHER): Payer: BC Managed Care – PPO | Admitting: Obstetrics & Gynecology

## 2022-09-10 ENCOUNTER — Inpatient Hospital Stay: Admission: RE | Admit: 2022-09-10 | Payer: BC Managed Care – PPO | Source: Ambulatory Visit

## 2022-09-24 DIAGNOSIS — K219 Gastro-esophageal reflux disease without esophagitis: Secondary | ICD-10-CM

## 2022-09-24 DIAGNOSIS — R1310 Dysphagia, unspecified: Secondary | ICD-10-CM

## 2022-12-03 ENCOUNTER — Telehealth (HOSPITAL_BASED_OUTPATIENT_CLINIC_OR_DEPARTMENT_OTHER): Payer: Self-pay | Admitting: Obstetrics & Gynecology

## 2022-12-03 DIAGNOSIS — R42 Dizziness and giddiness: Secondary | ICD-10-CM

## 2022-12-03 NOTE — Telephone Encounter (Signed)
Pt call requesting recommendations regarding vertigo.  Has had this in the past.  PCP: [facility] Skeens has moved from location so didn't know who to call.  Advised where [facility] Skeens is now located.  Antivert  qid recommended.  As well, she reports some increased allergy issues due to pollen season.  Has taken zyrtec in the past so recommended restarting.  Referral for Eppley maneuver discussed and placed today.  Signs and symptoms to be seen in the ER discussed.  She is going to make appt with new PCP as well.  Options discussed.  Pt aware she does not need a referral.

## 2022-12-08 ENCOUNTER — Ambulatory Visit: Payer: BC Managed Care – PPO | Attending: Obstetrics & Gynecology

## 2022-12-08 DIAGNOSIS — R2681 Unsteadiness on feet: Secondary | ICD-10-CM | POA: Diagnosis present

## 2022-12-08 DIAGNOSIS — R42 Dizziness and giddiness: Secondary | ICD-10-CM | POA: Diagnosis not present

## 2022-12-08 NOTE — Therapy (Signed)
OUTPATIENT PHYSICAL THERAPY VESTIBULAR EVALUATION     Patient Name: Nicole Fleming MRN: [id number] DOB:09-Jun-1961, 62 y.o., female Today's Date: 12/08/2022  END OF SESSION:  PT End of Session - 12/08/22 1523     Visit Number 1    Number of Visits 4    Date for PT Re-Evaluation 01/05/23    Authorization Type BC[employer] statePPO    PT Start Time 1530    PT Stop Time 1615    PT Time Calculation (min) 45 min             Past Medical History:  Diagnosis Date   Abnormal pap 08/18/1998   h/o LEEP   Asthma    Contact lens/glasses fitting    wears contacts or glasses   Depression    and anxiety   Hemorrhoids, external    Hiatal hernia    History of palpitations    not currently   IBS (irritable bowel syndrome)    questionable gluten allergy   Mitral valve prolapse 08/18/2004   at 62 yo, echo 2013-no MVP-normal valve-no regurg   Schatzki's ring 08/18/2005   of esophagus   Past Surgical History:  Procedure Laterality Date   CESAREAN SECTION  1990   CESAREAN SECTION  1992   CHOLECYSTECTOMY  12/2005   ESOPHAGEAL DILATION  1/07   ESOPHAGEAL MANOMETRY N/A 07/23/2022   Procedure: ESOPHAGEAL MANOMETRY (EM);  Surgeon: Tressia Danas, MD;  Location: [facility];  Service: Gastroenterology;  Laterality: N/A;   HEMORRHOID SURGERY N/A 03/01/2013   Procedure: HEMORRHOIDECTOMY;  Surgeon: Wilmon Arms. Corliss Skains, MD;  Location: [facility];  Service: General;  Laterality: N/A;   LEEP  1999   lipoma removal  10/29/2011   abdominal wall   SKIN BIOPSY Left 03/01/2013   Procedure: SKIN BIOPSY  X 2  ABDOMINAL WALL;  Surgeon: Wilmon Arms. Corliss Skains, MD;  Location: [facility];  Service: General;  Laterality: Left;   TUBAL LIGATION     XI ROBOTIC ASSISTED HIATAL HERNIA REPAIR N/A 08/05/2022   Procedure: ROBOTIC HIATAL HERNIA REPAIR WITH MESH AND FUNDOPLICATION;  Surgeon: Axel Filler, MD;  Location: [facility];  Service: General;  Laterality: N/A;   Patient Active Problem List    Diagnosis Date Noted   Dysphagia 09/24/2022   Gastroesophageal reflux disease 09/24/2022   S/P Nissen fundoplication (without gastrostomy tube) procedure 08/05/2022   Irritation of ear, bilateral 05/20/2022   Spasm of muscle of lower back 05/20/2022   Puncture wound of foot 03/17/2022   Statin intolerance 01/15/2022   Hiatal hernia 01/15/2022   Hyperlipidemia 09/17/2021   Elevated coronary artery calcium score 09/17/2021   Palpitations 05/24/2021   Hallux rigidus of left foot 01/23/2020   Asthma, chronic, mild persistent, uncomplicated 07/21/2018   DOE (dyspnea on exertion) 07/20/2018   Skin lesions - abdominal wall 01/20/2013   Chest pain 06/09/2012   Mitral valve prolapse 06/09/2012   External hemorrhoids 01/26/2012   Lipoma of abdominal wall 10/16/2011   RUQ abdominal pain 06/25/2011    PCP: Enid Skeens, NP REFERRING PROVIDER: Jerene Bears, MD  REFERRING DIAG: R42 (ICD-10-CM) - Vertigo  THERAPY DIAG:  Dizziness and giddiness  Unsteadiness on feet  ONSET DATE: 9 days ago  Rationale for Evaluation and Treatment: Rehabilitation  SUBJECTIVE:   SUBJECTIVE STATEMENT: 9 day history of vertigo with rolling in bed. Feeling dizzy and nauseated. Denies any tinnitus, denies photo/phonophobia.  Symptoms last a few minutes.  Bending over to tie shoes causes. Pt notes remote hx  of vertigo with spontaneous recovery after a day.   Pt accompanied by: self  PERTINENT HISTORY: PMH  PAIN:  Are you having pain? No  PRECAUTIONS: None  WEIGHT BEARING RESTRICTIONS: No  FALLS: Has patient fallen in last 6 months? No  LIVING ENVIRONMENT: Lives with: lives with their family Lives in: House/apartment Stairs: No Has following equipment at home: None  PLOF: Independent  PATIENT GOALS: eliminate symptoms  OBJECTIVE:   DIAGNOSTIC FINDINGS: n/a  COGNITION: Overall cognitive status: Within functional limits for tasks assessed   SENSATION: Not tested    POSTURE:  No  Significant postural limitations  Cervical ROM:    Extension guarded due to provocation of symptoms  STRENGTH: NT    TRANSFERS: Assistive device utilized:   Sit to stand: Complete Independence Stand to sit: Complete Independence Chair to chair: Complete Independence Floor:  NT   GAIT: Gait pattern: WFL Distance walked:  Assistive device utilized: None Level of assistance: Complete Independence Comments:   FUNCTIONAL TESTS:  TBD  PATIENT SURVEYS:  FOTO 39 (predicted 62)  VESTIBULAR ASSESSMENT:  GENERAL OBSERVATION: unremarkable   SYMPTOM BEHAVIOR:  Subjective history: 9 day hx of positional vertigo  Non-Vestibular symptoms:  denies  Type of dizziness: Spinning/Vertigo  Frequency: head position changes  Duration: 30-45 sec  Aggravating factors: Induced by position change: rolling to the left and supine to sit  Relieving factors: head stationary and slow movements  Progression of symptoms: unchanged  OCULOMOTOR EXAM:  Ocular Alignment: normal  Ocular ROM: No Limitations  Spontaneous Nystagmus: absent  Gaze-Induced Nystagmus: absent  Smooth Pursuits: saccades, catch up saccades looking right to left  Saccades: intact  Convergence/Divergence: 4 cm     VESTIBULAR - OCULAR REFLEX:   Slow VOR: Normal  VOR Cancellation: Normal  Head-Impulse Test: NT  Dynamic Visual Acuity: NT   POSITIONAL TESTING: Right Dix-Hallpike: no nystagmus Left Dix-Hallpike: upbeating, left nystagmus Right Roll Test: no nystagmus Left Roll Test: no nystagmus  MOTION SENSITIVITY:  Motion Sensitivity Quotient Intensity: 0 = none, 1 = Lightheaded, 2 = Mild, 3 = Moderate, 4 = Severe, 5 = Vomiting  Intensity  1. Sitting to supine   2. Supine to L side   3. Supine to R side   4. Supine to sitting   5. L Hallpike-Dix   6. Up from L    7. R Hallpike-Dix   8. Up from R    9. Sitting, head tipped to L knee   10. Head up from L knee   11. Sitting, head tipped to R knee   12. Head  up from R knee   13. Sitting head turns x5   14.Sitting head nods x5   15. In stance, 180 turn to L    16. In stance, 180 turn to R     OTHOSTATICS: not done  FUNCTIONAL GAIT: TBD   VESTIBULAR TREATMENT:                                                                                                   DATE: 12/08/22  Canalith Repositioning:  Epley Left:  Number of Reps: 1 , rechecked with left Dix-Hallpike and no nystagmus or symptoms Gaze Adaptation:   Habituation:   Other:   PATIENT EDUCATION: Education details: regarding signs/symptoms of BPPV and relevant anatomy Person educated: Patient Education method: Explanation and Handouts Education comprehension: verbalized understanding  HOME EXERCISE PROGRAM:  GOALS: Goals reviewed with patient? Yes  SHORT TERM GOALS: Target date: same as LTG    LONG TERM GOALS: Target date: 01/05/2023    Independent with relevant HEP Baseline:  Goal status: INITIAL  2.  Remain symptom free for improved QoL Baseline:  Goal status: INITIAL  3.  Meet predicted outcomes FOTO survey of 62 for improved symptom presentation Baseline: 39 Goal status: INITIAL    ASSESSMENT:  CLINICAL IMPRESSION: Patient is a 62 y.o. lady who was seen today for physical therapy evaluation and treatment for vertigo.  Unrevealing oculomotor exam and no indications of other vestibular pathology noted at this time.  Positional tests reveal +left Dix-Hallpike with left, upbeating nystagmus and subjective report of spinning/vertigo.  Performed Left Epley maneuver and recheck of left Dix-Hallpike reveals no symptoms or nystagmus.  Educated pt on relevant post-treatment follow-up and we have scheduled a tentative follow-up for later this week if needed and if not to place POC on 30-day hold should symptoms reemerge.   OBJECTIVE IMPAIRMENTS: dizziness.   ACTIVITY LIMITATIONS: bending, bed mobility, reach over head, and showering with eyes closed and tipping head  back  PARTICIPATION LIMITATIONS: yard work  PERSONAL FACTORS: Time since onset of injury/illness/exacerbation are also affecting patient's functional outcome.   REHAB POTENTIAL: Excellent  CLINICAL DECISION MAKING: Stable/uncomplicated  EVALUATION COMPLEXITY: Low   PLAN:  PT FREQUENCY: 1-2x/week, if needed  PT DURATION: 4 weeks  PLANNED INTERVENTIONS: Therapeutic exercises, Therapeutic activity, Neuromuscular re-education, Balance training, Gait training, Patient/Family education, Self Care, Joint mobilization, Vestibular training, Canalith repositioning, Aquatic Therapy, Dry Needling, Electrical stimulation, Spinal mobilization, and Manual therapy  PLAN FOR NEXT SESSION: f/u as needed   4:18 PM, 12/08/22 M. Shary Decamp, PT, DPT Physical Therapist- [place] Office Number: [phone]

## 2022-12-09 ENCOUNTER — Encounter (HOSPITAL_BASED_OUTPATIENT_CLINIC_OR_DEPARTMENT_OTHER): Payer: Self-pay | Admitting: Obstetrics & Gynecology

## 2022-12-11 ENCOUNTER — Ambulatory Visit: Payer: BC Managed Care – PPO

## 2023-05-25 ENCOUNTER — Ambulatory Visit (HOSPITAL_BASED_OUTPATIENT_CLINIC_OR_DEPARTMENT_OTHER): Payer: BC Managed Care – PPO | Admitting: Obstetrics & Gynecology

## 2023-06-12 ENCOUNTER — Encounter (HOSPITAL_BASED_OUTPATIENT_CLINIC_OR_DEPARTMENT_OTHER): Payer: Self-pay | Admitting: Obstetrics & Gynecology

## 2023-06-16 ENCOUNTER — Ambulatory Visit (HOSPITAL_BASED_OUTPATIENT_CLINIC_OR_DEPARTMENT_OTHER): Payer: BC Managed Care – PPO | Admitting: Obstetrics & Gynecology

## 2023-06-23 ENCOUNTER — Ambulatory Visit (HOSPITAL_BASED_OUTPATIENT_CLINIC_OR_DEPARTMENT_OTHER): Payer: BC Managed Care – PPO | Admitting: Obstetrics & Gynecology

## 2023-06-23 ENCOUNTER — Encounter (HOSPITAL_BASED_OUTPATIENT_CLINIC_OR_DEPARTMENT_OTHER): Payer: Self-pay | Admitting: Obstetrics & Gynecology

## 2023-06-23 ENCOUNTER — Other Ambulatory Visit (HOSPITAL_COMMUNITY)
Admission: RE | Admit: 2023-06-23 | Discharge: 2023-06-23 | Disposition: A | Discharge status: Home / Self Care | Payer: BC Managed Care – PPO | Source: Ambulatory Visit | Attending: Obstetrics & Gynecology | Admitting: Obstetrics & Gynecology

## 2023-06-23 VITALS — BP 133/89 | HR 63 | Ht 67.5 in | Wt 173.8 lb

## 2023-06-23 DIAGNOSIS — Z1231 Encounter for screening mammogram for malignant neoplasm of breast: Secondary | ICD-10-CM | POA: Diagnosis not present

## 2023-06-23 DIAGNOSIS — E2839 Other primary ovarian failure: Secondary | ICD-10-CM

## 2023-06-23 DIAGNOSIS — F419 Anxiety disorder, unspecified: Secondary | ICD-10-CM

## 2023-06-23 DIAGNOSIS — B977 Papillomavirus as the cause of diseases classified elsewhere: Secondary | ICD-10-CM | POA: Diagnosis present

## 2023-06-23 DIAGNOSIS — R87810 Cervical high risk human papillomavirus (HPV) DNA test positive: Secondary | ICD-10-CM | POA: Insufficient documentation

## 2023-06-23 DIAGNOSIS — Z13820 Encounter for screening for osteoporosis: Secondary | ICD-10-CM

## 2023-06-23 DIAGNOSIS — Z1151 Encounter for screening for human papillomavirus (HPV): Secondary | ICD-10-CM | POA: Insufficient documentation

## 2023-06-23 DIAGNOSIS — Z01419 Encounter for gynecological examination (general) (routine) without abnormal findings: Secondary | ICD-10-CM | POA: Diagnosis present

## 2023-06-23 DIAGNOSIS — K625 Hemorrhage of anus and rectum: Secondary | ICD-10-CM

## 2023-06-23 DIAGNOSIS — K648 Other hemorrhoids: Secondary | ICD-10-CM

## 2023-06-23 DIAGNOSIS — Z658 Other specified problems related to psychosocial circumstances: Secondary | ICD-10-CM

## 2023-06-23 NOTE — Progress Notes (Unsigned)
62 y.o. G2P2 Single White or Caucasian female here for annual exam.  Doing well but is having some new RUQ pain that was present before her hiatal hernia repair in 07/2022.  Have encouraged her to reach out to Dr. Derrell Lolling.    Denies vaginal bleeding.    H/o elevated coronary CT scoring.  She started on a statin.  She has significant side effects so was advised to stop.    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  Had a significant pay raise.  Stressors are better.  Uses celexa less regularly.    She is having some discharge that seems to be form the rectum.  H/O significant hemorrhoids, s/p surgical correction in 2014 with Dr. Corliss Skains.  Does have intermittent rectal bleeding with bowel movements.  She knows this is worse if having some more constipation.  Cheese causes this for her.    Patient's last menstrual period was 11/26/2011.          Sexually active: Yes.    The current method of family planning is post menopausal status.    Smoker:  no  Health Maintenance: Pap:  05/2022 neg with +HR HPV History of abnormal Pap:  yes MMG:  06/2022, order placed Colonoscopy:  cologuard 12/2019 BMD:   ordered Screening Labs: done 12/2021   reports that she has never smoked. She has never used smokeless tobacco. She reports that she does not drink alcohol and does not use drugs.  Past Medical History:  Diagnosis Date   Abnormal pap 08/18/1998   h/o LEEP   Asthma    Contact lens/glasses fitting    wears contacts or glasses   Depression    and anxiety   Hemorrhoids, external    Hiatal hernia    History of palpitations    not currently   IBS (irritable bowel syndrome)    questionable gluten allergy   Mitral valve prolapse 08/18/2004   at 62 yo, echo 2013-no MVP-normal valve-no regurg   Schatzki's ring 08/18/2005   of esophagus    Past Surgical History:  Procedure Laterality Date   CESAREAN SECTION  1990   CESAREAN SECTION  1992   CHOLECYSTECTOMY  12/2005   ESOPHAGEAL DILATION  1/07   ESOPHAGEAL  MANOMETRY N/A 07/23/2022   Procedure: ESOPHAGEAL MANOMETRY (EM);  Surgeon: Tressia Danas, MD;  Location: [facility];  Service: Gastroenterology;  Laterality: N/A;   HEMORRHOID SURGERY N/A 03/01/2013   Procedure: HEMORRHOIDECTOMY;  Surgeon: Wilmon Arms. Corliss Skains, MD;  Location: [facility];  Service: General;  Laterality: N/A;   LEEP  1999   lipoma removal  10/29/2011   abdominal wall   SKIN BIOPSY Left 03/01/2013   Procedure: SKIN BIOPSY  X 2  ABDOMINAL WALL;  Surgeon: Wilmon Arms. Corliss Skains, MD;  Location: [facility];  Service: General;  Laterality: Left;   TUBAL LIGATION     XI ROBOTIC ASSISTED HIATAL HERNIA REPAIR N/A 08/05/2022   Procedure: ROBOTIC HIATAL HERNIA REPAIR WITH MESH AND FUNDOPLICATION;  Surgeon: Axel Filler, MD;  Location: [facility];  Service: General;  Laterality: N/A;    Current Outpatient Medications  Medication Sig Dispense Refill   albuterol (PROAIR HFA) 108 (90 Base) MCG/ACT inhaler 2 puffs every 4 hours as needed for short of breath, wheezing or chest tightness 18 g 1   budesonide-formoterol (SYMBICORT) 160-4.5 MCG/ACT inhaler Inhale 2 puffs into the lungs 2 (two) times daily. (Patient taking differently: Inhale 2 puffs into the lungs daily as needed (shortness of breath).) 1  each 0   citalopram (CELEXA) 20 MG tablet Take 1 tablet (20 mg total) by mouth daily. (Patient taking differently: Take 20 mg by mouth daily as needed (anxiety).) 90 tablet 1   diphenhydrAMINE (BENADRYL) 25 MG tablet Take 25 mg by mouth every 6 (six) hours as needed for allergies.     No current facility-administered medications for this visit.    Family History  Problem Relation Age of Onset   Heart disease Father 2       cardiac arrest/mitral valve replaced   Colon cancer Neg Hx    Colon polyps Neg Hx    Esophageal cancer Neg Hx    Stomach cancer Neg Hx    Rectal cancer Neg Hx     ROS: Constitutional: negative Genitourinary:negative  Exam:   BP 133/89 (BP  Location: Right Arm, Patient Position: Sitting, Cuff Size: Normal)   Pulse 63   Ht 5' 7.5" (1.715 m)   Wt 173 lb 12.8 oz (78.8 kg)   LMP 11/26/2011   BMI 26.82 kg/m   Height: 5' 7.5" (171.5 cm)  General appearance: alert, cooperative and appears stated age Head: Normocephalic, without obvious abnormality, atraumatic Neck: no adenopathy, supple, symmetrical, trachea midline and thyroid normal to inspection and palpation Lungs: clear to auscultation bilaterally Breasts: normal appearance, no masses or tenderness Heart: regular rate and rhythm Abdomen: soft, non-tender; bowel sounds normal; no masses,  no organomegaly Extremities: extremities normal, atraumatic, no cyanosis or edema Skin: Skin color, texture, turgor normal. No rashes or lesions Lymph nodes: Cervical, supraclavicular, and axillary nodes normal. No abnormal inguinal nodes palpated Neurologic: Grossly normal   Pelvic: External genitalia:  no lesions              Urethra:  normal appearing urethra with no masses, tenderness or lesions              Bartholins and Skenes: normal                 Vagina: normal appearing vagina with normal color and no discharge, no lesions              Cervix: no lesions              Pap taken: Yes.   Bimanual Exam:  Uterus:  normal size, contour, position, consistency, mobility, non-tender              Adnexa: normal adnexa and no mass, fullness, tenderness               Rectovaginal: Confirms               Anus:  normal sphincter tone, no lesions  Chaperone, Ina Homes, CMA, was present for exam.  Assessment/Plan:

## 2023-06-30 LAB — CYTOLOGY - PAP
Comment: NEGATIVE
Comment: NEGATIVE
Comment: NEGATIVE
Diagnosis: NEGATIVE
HPV 16: NEGATIVE
HPV 18 / 45: NEGATIVE
High risk HPV: POSITIVE — AB

## 2023-07-10 ENCOUNTER — Encounter (HOSPITAL_BASED_OUTPATIENT_CLINIC_OR_DEPARTMENT_OTHER): Payer: Self-pay | Admitting: Obstetrics & Gynecology

## 2023-07-21 ENCOUNTER — Ambulatory Visit
Admission: RE | Admit: 2023-07-21 | Discharge: 2023-07-21 | Disposition: A | Discharge status: Home / Self Care | Payer: BC Managed Care – PPO | Source: Ambulatory Visit | Attending: Obstetrics & Gynecology | Admitting: Obstetrics & Gynecology

## 2023-07-21 DIAGNOSIS — Z1231 Encounter for screening mammogram for malignant neoplasm of breast: Secondary | ICD-10-CM

## 2023-07-30 ENCOUNTER — Ambulatory Visit (HOSPITAL_BASED_OUTPATIENT_CLINIC_OR_DEPARTMENT_OTHER): Payer: BC Managed Care – PPO | Admitting: Obstetrics & Gynecology

## 2023-07-30 ENCOUNTER — Other Ambulatory Visit (HOSPITAL_COMMUNITY)
Admission: RE | Admit: 2023-07-30 | Discharge: 2023-07-30 | Disposition: A | Discharge status: Home / Self Care | Payer: BC Managed Care – PPO | Source: Ambulatory Visit | Attending: Obstetrics & Gynecology | Admitting: Obstetrics & Gynecology

## 2023-07-30 ENCOUNTER — Encounter (HOSPITAL_BASED_OUTPATIENT_CLINIC_OR_DEPARTMENT_OTHER): Payer: Self-pay | Admitting: Obstetrics & Gynecology

## 2023-07-30 VITALS — BP 144/109 | HR 80 | Wt 171.0 lb

## 2023-07-30 DIAGNOSIS — Z1151 Encounter for screening for human papillomavirus (HPV): Secondary | ICD-10-CM | POA: Insufficient documentation

## 2023-07-30 DIAGNOSIS — B977 Papillomavirus as the cause of diseases classified elsewhere: Secondary | ICD-10-CM

## 2023-07-30 DIAGNOSIS — R87810 Cervical high risk human papillomavirus (HPV) DNA test positive: Secondary | ICD-10-CM | POA: Diagnosis present

## 2023-07-30 DIAGNOSIS — N952 Postmenopausal atrophic vaginitis: Secondary | ICD-10-CM

## 2023-07-30 DIAGNOSIS — N905 Atrophy of vulva: Secondary | ICD-10-CM | POA: Diagnosis not present

## 2023-07-30 MED ORDER — ESTRADIOL 0.1 MG/GM VA CREA: TOPICAL_CREAM | VAGINAL | 1 refills | Status: DC

## 2023-07-30 NOTE — Progress Notes (Signed)
62 y.o. G2P2 Single Not Hispanic or Latino female here for colposcopy with possible biopsies and/or ECC due to +HR HPV with normal Pap obtained 06/23/2023.  16/18/45 testing was also negative.    Prior evaluation/treatment:  h/o LEEP in 1999.  Pt is very upset about her positive HR HPV testing.  Getting married next week.  Has a lot of questions about HPV that were all answered to the best of my ability.  Patient's last menstrual period was 11/26/2011.          Sexually active: not currently The current method of family planning is post menopausal status.     Patient has been counseled about results and procedure.  Risks and benefits have bene reviewed including immediate and/or delayed bleeding, infection, cervical scaring from procedure, possibility of needing additional follow up as well as treatment.  Rare risks of missing a lesion discussed as well.  All questions answered.  Pt ready to proceed.  Consent obtained.  LMP 11/26/2011   General appearance: alert, cooperative and appears stated age Lymph nodes: No abnormal inguinal nodes palpated Neurologic: Grossly normal  Pelvic: External genitalia:  no lesions              Urethra:  normal appearing urethra with no masses, tenderness or lesions              Bartholins and Skenes: normal                 Vagina: normal appearing vagina with normal color and no discharge, no lesions               Physical Exam Constitutional:      Appearance: Normal appearance.  Neurological:     General: No focal deficit present.  Psychiatric:        Mood and Affect: Mood normal.     Speculum placed.  3% acetic acid applied to cervix for >45 seconds.  Cervix visualized with both 7.5X and 15X magnification.  Green filter also used.  Lugols solution was not used.  Findings:  no AWE.  Biopsy:  none obtained.  ECC:  was performed.  Monsel's was not needed.  Cervix is almost flush with vaginal apex.  Excellent hemostasis was present.  Pt tolerated procedure  well and all instruments were removed.   Chaperone, Raechel Ache, RN, was present during procedure.  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 Vaginal atrophy - will start vaginal estradiol cream.  Rx to pharmacy. - estradiol (ESTRACE) 0.1 MG/GM vaginal cream; 1 gram vaginally twice weekly  Dispense: 42.5 g; Refill: 1  - Pathology results will be called to patient and follow-up planned pending results.

## 2023-08-03 ENCOUNTER — Ambulatory Visit: Payer: Self-pay | Admitting: General Surgery

## 2023-08-03 LAB — SURGICAL PATHOLOGY

## 2023-08-03 NOTE — H&P (Signed)
    REFERRING PHYSICIAN:  Jerene Fleming., MD  PROVIDER:  Elenora Gamma, MD  MRN: [id number] DOB: 1961/07/14 DATE OF ENCOUNTER: 08/03/2023  Subjective   Chief Complaint: Wound Check (NEW PROBLEM - Eval of hems)     History of Present Illness: Nicole Fleming is a 62 y.o. female who is seen today as an office consultation at the request of Nicole Fleming for evaluation of Wound Check (NEW PROBLEM - Eval of hems) .   Patient with approximately 3 months of anal irritation, worse with hard bowel movements.  She has occasional bleeding.  She reports mostly regular bowel habits with occasional diarrhea and constipation.  She has a history of hemorrhoidectomy with Nicole Fleming approximately 10 years ago.   Review of Systems: A complete review of systems was obtained from the patient.  I have reviewed this information and discussed as appropriate with the patient.  See HPI as well for other ROS.    Medical History: Past Medical History:  Diagnosis Date   Arthritis    Asthma (HHS-HCC)    Hyperlipidemia     There is no problem list on file for this patient.   Past Surgical History:  Procedure Laterality Date   ESOPHAGEAL DILATION  08/2005   CHOLECYSTECTOMY  12/2005   lipoma removal  10/29/2011   Hemmorhoidectomy  03/01/2013   Nicole Fleming   CESAREAN SECTION     [phone]   REPAIR PARAESOPHAGEAL HIATAL HERNIA FUNDOPLICATION W/MESH/PROSTHESIS     Tubal ligation       Allergies  Allergen Reactions   Amoxicillin Rash    Allergic to the Grape flavor in liquid amoxicillin    Current Outpatient Medications on File Prior to Visit  Medication Sig Dispense Refill   citalopram (CELEXA) 20 MG tablet Take 20 mg by mouth once daily     pantoprazole (PROTONIX) 40 MG DR tablet      No current facility-administered medications on file prior to visit.    History reviewed. No pertinent family history.   Social History   Tobacco Use  Smoking Status Never  Smokeless Tobacco Never      Social History   Socioeconomic History   Marital status: Single  Tobacco Use   Smoking status: Never   Smokeless tobacco: Never  Substance and Sexual Activity   Alcohol use: Never   Drug use: Never    Objective:    There were no vitals filed for this visit.   Exam Gen: NAD Abd: soft Rectal: Prolapse, grade 4 right anterior hemorrhoid   Labs, Imaging and Diagnostic Testing:  Procedure: Anoscopy Surgeon: Nicole Fus After the risks and benefits were explained, written consent was obtained for above procedure.  A medical assistant chaperone was present thoroughout the entire procedure.  Anesthesia: none Diagnosis: Hemorrhoids Findings: Grade 4 right anterior, no other hemorrhoid disease noted.  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  I have recommended hemorrhoidectomy.  We discussed this in detail.  All questions were answered.  Risk include bleeding, infection, damage to adjacent structures and recurrence.  All of these risks are very low.  Nicole Panda, MD Colon and Rectal Surgery [facility]

## 2023-08-03 NOTE — H&P (View-Only) (Signed)
    REFERRING PHYSICIAN:  Jerene Bears., MD  PROVIDER:  Elenora Gamma, MD  MRN: [id number] DOB: 1961/07/14 DATE OF ENCOUNTER: 08/03/2023  Subjective   Chief Complaint: Wound Check (NEW PROBLEM - Eval of hems)     History of Present Illness: Nicole Fleming is a 62 y.o. female who is seen today as an office consultation at the request of Dr. Hyacinth Meeker for evaluation of Wound Check (NEW PROBLEM - Eval of hems) .   Patient with approximately 3 months of anal irritation, worse with hard bowel movements.  She has occasional bleeding.  She reports mostly regular bowel habits with occasional diarrhea and constipation.  She has a history of hemorrhoidectomy with Dr. Harlon Flor approximately 10 years ago.   Review of Systems: A complete review of systems was obtained from the patient.  I have reviewed this information and discussed as appropriate with the patient.  See HPI as well for other ROS.    Medical History: Past Medical History:  Diagnosis Date   Arthritis    Asthma (HHS-HCC)    Hyperlipidemia     There is no problem list on file for this patient.   Past Surgical History:  Procedure Laterality Date   ESOPHAGEAL DILATION  08/2005   CHOLECYSTECTOMY  12/2005   lipoma removal  10/29/2011   Hemmorhoidectomy  03/01/2013   Dr. Corliss Skains   CESAREAN SECTION     [phone]   REPAIR PARAESOPHAGEAL HIATAL HERNIA FUNDOPLICATION W/MESH/PROSTHESIS     Tubal ligation       Allergies  Allergen Reactions   Amoxicillin Rash    Allergic to the Grape flavor in liquid amoxicillin    Current Outpatient Medications on File Prior to Visit  Medication Sig Dispense Refill   citalopram (CELEXA) 20 MG tablet Take 20 mg by mouth once daily     pantoprazole (PROTONIX) 40 MG DR tablet      No current facility-administered medications on file prior to visit.    History reviewed. No pertinent family history.   Social History   Tobacco Use  Smoking Status Never  Smokeless Tobacco Never      Social History   Socioeconomic History   Marital status: Single  Tobacco Use   Smoking status: Never   Smokeless tobacco: Never  Substance and Sexual Activity   Alcohol use: Never   Drug use: Never    Objective:    There were no vitals filed for this visit.   Exam Gen: NAD Abd: soft Rectal: Prolapse, grade 4 right anterior hemorrhoid   Labs, Imaging and Diagnostic Testing:  Procedure: Anoscopy Surgeon: Maisie Fus After the risks and benefits were explained, written consent was obtained for above procedure.  A medical assistant chaperone was present thoroughout the entire procedure.  Anesthesia: none Diagnosis: Hemorrhoids Findings: Grade 4 right anterior, no other hemorrhoid disease noted.  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  I have recommended hemorrhoidectomy.  We discussed this in detail.  All questions were answered.  Risk include bleeding, infection, damage to adjacent structures and recurrence.  All of these risks are very low.  Nicole Panda, MD Colon and Rectal Surgery [facility]

## 2023-08-10 ENCOUNTER — Encounter (HOSPITAL_BASED_OUTPATIENT_CLINIC_OR_DEPARTMENT_OTHER): Payer: Self-pay | Admitting: General Surgery

## 2023-08-10 NOTE — Progress Notes (Signed)
Spoke w/ via phone for pre-op interview---Zaela Lab needs dos---- NONE        Lab results------ COVID test -----patient states asymptomatic no test needed Arrive at -------0700 NPO after MN NO Solid Food.  Clear liquids from MN until---0600 Med rec completed Medications to take morning of surgery -----Symbicort and Albuterol PRN Diabetic medication ----- Patient instructed no nail polish to be worn day of surgery Patient instructed to bring photo id and insurance card day of surgery Patient aware to have Driver (ride ) / caregiver    for 24 hours after surgery - Ula Lingo Patient Special Instructions ----- Pre-Op special Instructions ----- Patient verbalized understanding of instructions that were given at this phone interview. Patient denies chest pain, sob, fever, cough at the interview.

## 2023-08-14 ENCOUNTER — Other Ambulatory Visit: Payer: Self-pay

## 2023-08-14 ENCOUNTER — Ambulatory Visit (HOSPITAL_BASED_OUTPATIENT_CLINIC_OR_DEPARTMENT_OTHER): Payer: BC Managed Care – PPO | Admitting: Anesthesiology

## 2023-08-14 ENCOUNTER — Encounter (HOSPITAL_BASED_OUTPATIENT_CLINIC_OR_DEPARTMENT_OTHER): Payer: Self-pay | Admitting: General Surgery

## 2023-08-14 ENCOUNTER — Encounter (HOSPITAL_BASED_OUTPATIENT_CLINIC_OR_DEPARTMENT_OTHER)
Admission: RE | Disposition: A | Discharge status: Home / Self Care | Payer: Self-pay | Source: Home / Self Care | Attending: General Surgery

## 2023-08-14 ENCOUNTER — Ambulatory Visit (HOSPITAL_BASED_OUTPATIENT_CLINIC_OR_DEPARTMENT_OTHER)
Admission: RE | Admit: 2023-08-14 | Discharge: 2023-08-14 | Disposition: A | Discharge status: Home / Self Care | Payer: BC Managed Care – PPO | Attending: General Surgery | Admitting: General Surgery

## 2023-08-14 DIAGNOSIS — Z538 Procedure and treatment not carried out for other reasons: Secondary | ICD-10-CM | POA: Insufficient documentation

## 2023-08-14 DIAGNOSIS — Z7951 Long term (current) use of inhaled steroids: Secondary | ICD-10-CM | POA: Insufficient documentation

## 2023-08-14 DIAGNOSIS — K449 Diaphragmatic hernia without obstruction or gangrene: Secondary | ICD-10-CM | POA: Insufficient documentation

## 2023-08-14 DIAGNOSIS — J45909 Unspecified asthma, uncomplicated: Secondary | ICD-10-CM | POA: Insufficient documentation

## 2023-08-14 DIAGNOSIS — K643 Fourth degree hemorrhoids: Secondary | ICD-10-CM | POA: Insufficient documentation

## 2023-08-14 DIAGNOSIS — K219 Gastro-esophageal reflux disease without esophagitis: Secondary | ICD-10-CM | POA: Insufficient documentation

## 2023-08-14 DIAGNOSIS — Z9049 Acquired absence of other specified parts of digestive tract: Secondary | ICD-10-CM | POA: Diagnosis not present

## 2023-08-14 HISTORY — PX: HEMORRHOID SURGERY: SHX153

## 2023-08-14 HISTORY — DX: Anxiety disorder, unspecified: F41.9

## 2023-08-14 SURGERY — HEMORRHOIDECTOMY
Anesthesia: Monitor Anesthesia Care | Site: Rectum

## 2023-08-14 MED ORDER — KETAMINE HCL 10 MG/ML IJ SOLN
INTRAMUSCULAR | Status: DC | PRN
  Administered 2023-08-14: 15 mg via INTRAVENOUS

## 2023-08-14 MED ORDER — FENTANYL CITRATE (PF) 100 MCG/2ML IJ SOLN: 25.0000 ug | INTRAMUSCULAR | Status: DC | PRN

## 2023-08-14 MED ORDER — MEPERIDINE HCL 25 MG/ML IJ SOLN: 6.2500 mg | INTRAMUSCULAR | Status: DC | PRN

## 2023-08-14 MED ORDER — BUPIVACAINE-EPINEPHRINE (PF) 0.5% -1:200000 IJ SOLN
INTRAMUSCULAR | Status: DC | PRN
  Administered 2023-08-14: 30 mL

## 2023-08-14 MED ORDER — ACETAMINOPHEN 500 MG PO TABS
1000.0000 mg | ORAL_TABLET | ORAL | Status: AC
  Administered 2023-08-14: 1000 mg via ORAL

## 2023-08-14 MED ORDER — LIDOCAINE HCL (PF) 2 % IJ SOLN
INTRAMUSCULAR | Status: AC
Start: 2023-08-14 — End: ?
  Filled 2023-08-14: qty 5

## 2023-08-14 MED ORDER — MIDAZOLAM HCL 2 MG/2ML IJ SOLN
0.5000 mg | Freq: Once | INTRAMUSCULAR | Status: DC | PRN
Start: 2023-08-14 — End: 2023-08-14

## 2023-08-14 MED ORDER — CELECOXIB 200 MG PO CAPS
200.0000 mg | ORAL_CAPSULE | ORAL | Status: AC
  Administered 2023-08-14: 200 mg via ORAL

## 2023-08-14 MED ORDER — MIDAZOLAM HCL 2 MG/2ML IJ SOLN
INTRAMUSCULAR | Status: AC
  Filled 2023-08-14: qty 2

## 2023-08-14 MED ORDER — KETAMINE HCL 50 MG/5ML IJ SOSY
PREFILLED_SYRINGE | INTRAMUSCULAR | Status: AC
  Filled 2023-08-14: qty 5

## 2023-08-14 MED ORDER — MIDAZOLAM HCL 2 MG/2ML IJ SOLN
INTRAMUSCULAR | Status: DC | PRN
  Administered 2023-08-14: 2 mg via INTRAVENOUS

## 2023-08-14 MED ORDER — ONDANSETRON HCL 4 MG/2ML IJ SOLN
4.0000 mg | Freq: Once | INTRAMUSCULAR | Status: AC
  Administered 2023-08-14: 4 mg via INTRAVENOUS

## 2023-08-14 MED ORDER — ONDANSETRON HCL 4 MG/2ML IJ SOLN
INTRAMUSCULAR | Status: AC
  Filled 2023-08-14: qty 2

## 2023-08-14 MED ORDER — GABAPENTIN 300 MG PO CAPS
ORAL_CAPSULE | ORAL | Status: AC
  Filled 2023-08-14: qty 1

## 2023-08-14 MED ORDER — ALBUTEROL SULFATE (2.5 MG/3ML) 0.083% IN NEBU
INHALATION_SOLUTION | RESPIRATORY_TRACT | Status: AC
  Filled 2023-08-14: qty 3

## 2023-08-14 MED ORDER — BUPIVACAINE LIPOSOME 1.3 % IJ SUSP: 20.0000 mL | Freq: Once | INTRAMUSCULAR | Status: DC

## 2023-08-14 MED ORDER — CELECOXIB 200 MG PO CAPS
ORAL_CAPSULE | ORAL | Status: AC
  Filled 2023-08-14: qty 1

## 2023-08-14 MED ORDER — FENTANYL CITRATE (PF) 100 MCG/2ML IJ SOLN
INTRAMUSCULAR | Status: DC | PRN
  Administered 2023-08-14: 50 ug via INTRAVENOUS

## 2023-08-14 MED ORDER — BUPIVACAINE-EPINEPHRINE 0.5% -1:200000 IJ SOLN: INTRAMUSCULAR | Status: DC | PRN

## 2023-08-14 MED ORDER — OXYCODONE HCL 5 MG PO TABS: 5.0000 mg | ORAL_TABLET | Freq: Once | ORAL | Status: DC | PRN

## 2023-08-14 MED ORDER — GABAPENTIN 300 MG PO CAPS
300.0000 mg | ORAL_CAPSULE | ORAL | Status: AC
  Administered 2023-08-14: 300 mg via ORAL

## 2023-08-14 MED ORDER — BUPIVACAINE LIPOSOME 1.3 % IJ SUSP: INTRAMUSCULAR | Status: DC | PRN

## 2023-08-14 MED ORDER — SODIUM CHLORIDE 0.9 % IV SOLN: INTRAVENOUS | Status: DC

## 2023-08-14 MED ORDER — OXYCODONE HCL 5 MG/5ML PO SOLN: 5.0000 mg | Freq: Once | ORAL | Status: DC | PRN

## 2023-08-14 MED ORDER — ACETAMINOPHEN 500 MG PO TABS
ORAL_TABLET | ORAL | Status: AC
Start: 2023-08-14 — End: ?
  Filled 2023-08-14: qty 2

## 2023-08-14 MED ORDER — PROPOFOL 500 MG/50ML IV EMUL
INTRAVENOUS | Status: AC
  Filled 2023-08-14: qty 50

## 2023-08-14 MED ORDER — PROPOFOL 500 MG/50ML IV EMUL
INTRAVENOUS | Status: DC | PRN
  Administered 2023-08-14: 200 ug/kg/min via INTRAVENOUS

## 2023-08-14 MED ORDER — FENTANYL CITRATE (PF) 100 MCG/2ML IJ SOLN
INTRAMUSCULAR | Status: AC
  Filled 2023-08-14: qty 2

## 2023-08-14 MED ORDER — PROPOFOL 10 MG/ML IV BOLUS
INTRAVENOUS | Status: DC | PRN
  Administered 2023-08-14: 30 mg via INTRAVENOUS

## 2023-08-14 MED ORDER — ALBUTEROL SULFATE (2.5 MG/3ML) 0.083% IN NEBU
2.5000 mg | INHALATION_SOLUTION | Freq: Once | RESPIRATORY_TRACT | Status: AC
  Administered 2023-08-14: 2.5 mg via RESPIRATORY_TRACT

## 2023-08-14 MED ORDER — GLYCOPYRROLATE PF 0.2 MG/ML IJ SOSY
PREFILLED_SYRINGE | INTRAMUSCULAR | Status: DC | PRN
  Administered 2023-08-14: .2 mg via INTRAVENOUS

## 2023-08-14 MED ORDER — SODIUM CHLORIDE 0.9% FLUSH: 3.0000 mL | Freq: Two times a day (BID) | INTRAVENOUS | Status: DC

## 2023-08-14 SURGICAL SUPPLY — 39 items
BLADE EXTENDED COATED 6.5IN (ELECTRODE) IMPLANT
BLADE SURG 10 STRL SS (BLADE) IMPLANT
BRIEF MESH DISP LRG (UNDERPADS AND DIAPERS) IMPLANT
COVER BACK TABLE 60X90IN (DRAPES) ×1 IMPLANT
COVER MAYO STAND STRL (DRAPES) ×1 IMPLANT
DRAPE HYSTEROSCOPY (MISCELLANEOUS) IMPLANT
DRAPE LAPAROTOMY 100X72 PEDS (DRAPES) ×1 IMPLANT
DRAPE SHEET LG 3/4 BI-LAMINATE (DRAPES) IMPLANT
DRAPE UTILITY XL STRL (DRAPES) ×1 IMPLANT
ELECT REM PT RETURN 9FT ADLT (ELECTROSURGICAL)
ELECTRODE REM PT RTRN 9FT ADLT (ELECTROSURGICAL) ×1 IMPLANT
GAUZE 4X4 16PLY ~~LOC~~+RFID DBL (SPONGE) ×1 IMPLANT
GAUZE PAD ABD 8X10 STRL (GAUZE/BANDAGES/DRESSINGS) ×1 IMPLANT
GAUZE SPONGE 4X4 12PLY STRL (GAUZE/BANDAGES/DRESSINGS) IMPLANT
GLOVE BIO SURGEON STRL SZ 6.5 (GLOVE) ×1 IMPLANT
GLOVE INDICATOR 6.5 STRL GRN (GLOVE) ×1 IMPLANT
KIT SIGMOIDOSCOPE (SET/KITS/TRAYS/PACK) IMPLANT
KIT TURNOVER CYSTO (KITS) ×1 IMPLANT
LEGGING LITHOTOMY PAIR STRL (DRAPES) IMPLANT
NDL HYPO 22X1.5 SAFETY MO (MISCELLANEOUS) ×1 IMPLANT
NEEDLE HYPO 22X1.5 SAFETY MO (MISCELLANEOUS) ×1
NS IRRIG 500ML POUR BTL (IV SOLUTION) ×1 IMPLANT
PACK BASIN DAY SURGERY FS (CUSTOM PROCEDURE TRAY) ×1 IMPLANT
PAD ARMBOARD 7.5X6 YLW CONV (MISCELLANEOUS) IMPLANT
PENCIL SMOKE EVACUATOR (MISCELLANEOUS) ×1 IMPLANT
SLEEVE SCD COMPRESS KNEE MED (STOCKING) ×1 IMPLANT
SPONGE HEMORRHOID 8X3CM (HEMOSTASIS) IMPLANT
SPONGE SURGIFOAM ABS GEL 100 (HEMOSTASIS) IMPLANT
SPONGE SURGIFOAM ABS GEL 12-7 (HEMOSTASIS) IMPLANT
SUT CHROMIC 2 0 SH (SUTURE) ×2 IMPLANT
SUT CHROMIC 3 0 SH 27 (SUTURE) ×2 IMPLANT
SUT VIC AB 2-0 SH 27XBRD (SUTURE) IMPLANT
SUT VIC AB 4-0 SH 18 (SUTURE) IMPLANT
SYR CONTROL 10ML LL (SYRINGE) ×1 IMPLANT
TOWEL OR 17X24 6PK STRL BLUE (TOWEL DISPOSABLE) ×1 IMPLANT
TRAY DSU PREP LF (CUSTOM PROCEDURE TRAY) ×1 IMPLANT
TUBE CONNECTING 12X1/4 (SUCTIONS) ×1 IMPLANT
WATER STERILE IRR 500ML POUR (IV SOLUTION) IMPLANT
YANKAUER SUCT BULB TIP NO VENT (SUCTIONS) ×1 IMPLANT

## 2023-08-14 NOTE — Anesthesia Preprocedure Evaluation (Addendum)
Anesthesia Evaluation  Patient identified by MRN, date of birth, ID band Patient awake    Reviewed: Allergy & Precautions, NPO status , Patient's Chart, lab work & pertinent test results  History of Anesthesia Complications Negative for: history of anesthetic complications  Airway Mallampati: II  TM Distance: >3 FB Neck ROM: Full    Dental  (+) Teeth Intact, Dental Advisory Given   Pulmonary asthma , COPD,  COPD inhaler   breath sounds clear to auscultation       Cardiovascular (-) angina  Rhythm:Regular Rate:Normal  '22 ECHO: EF 60 to 65%.  1.The LV has normal function, no regional wall motion abnormalities. Left ventricular diastolic parameters were normal.   2. RVF is normal. The right ventricular size is normal.   3. Possible mild myxomatous changes of the mitral valve, but prolapse is not seen. The mitral valve is normal in structure. No evidence of mitral valve regurgitation. No evidence of mitral stenosis.   4. Tricuspid valve regurgitation is mild to moderate.   5. The aortic valve is tricuspid. Aortic valve regurgitation is not visualized. No aortic stenosis is present.     Neuro/Psych negative neurological ROS     GI/Hepatic Neg liver ROS, hiatal hernia,GERD (s/p [employer])  Controlled,,  Endo/Other  negative endocrine ROS    Renal/GU negative Renal ROS     Musculoskeletal   Abdominal   Peds  Hematology negative hematology ROS (+)   Anesthesia Other Findings   Reproductive/Obstetrics                             Anesthesia Physical Anesthesia Plan  ASA: 2  Anesthesia Plan: MAC   Post-op Pain Management: Tylenol PO (pre-op)*   Induction: Intravenous  PONV Risk Score and Plan: 2 and Ondansetron and Treatment may vary due to age or medical condition  Airway Management Planned: Natural Airway and Simple Face Mask  Additional Equipment: None  Intra-op Plan:   Post-operative  Plan:   Informed Consent: I have reviewed the patients History and Physical, chart, labs and discussed the procedure including the risks, benefits and alternatives for the proposed anesthesia with the patient or authorized representative who has indicated his/her understanding and acceptance.     Dental advisory given  Plan Discussed with: CRNA and Surgeon  Anesthesia Plan Comments:         Anesthesia Quick Evaluation

## 2023-08-14 NOTE — Op Note (Signed)
08/14/2023  11:34 AM  PATIENT:  Nicole Fleming  62 y.o. female  Patient Care Team: Patient, No Pcp Per as PCP - General (General Practice) Runell Gess, MD as PCP - Cardiology (Cardiology)  PRE-OPERATIVE DIAGNOSIS:  grade 4 hemorrhoids  POST-OPERATIVE DIAGNOSIS:  grade 4 hemorrhoids  PROCEDURE:  none    Surgeon(s): Romie Levee, MD  ASSISTANT: none   ANESTHESIA:   local and MAC  SPECIMEN:  No Specimen  DISPOSITION OF SPECIMEN:  N/A  COUNTS:  YES  PLAN OF CARE: Discharge to home after PACU  PATIENT DISPOSITION:  PACU - hemodynamically stable.  INDICATION: 62 y.o. F with grade 4 hemorrhoid   OR FINDINGS: grade 4 hemorrhoid  DESCRIPTION: the patient was identified in the preoperative holding area and taken to the OR where they were laid on the operating room table.  MAC anesthesia was induced without difficulty. The patient was then positioned in prone jackknife position with buttocks gently taped apart.  The patient was then prepped and draped in usual sterile fashion.  SCDs were noted to be in place prior to the initiation of anesthesia. A surgical timeout was performed indicating the correct patient, procedure, positioning and need for preoperative antibiotics.  A rectal block was performed using Marcaine with epinephrine.    At this point the patient began coughing quite a bit and had bilious emesis.  She was suctioned immediately but the procedure was halted.  She did not have any desaturations during this time.  We then placed her back on the operative gurney and she was evaluated by the CRNA and anesthesiologist.  She did not appear to have any significant aspiration event, but the decision was made to hold off on proceeding with a hemorrhoidectomy as she would need to be intubated for this and we worried about forcing bile into her lungs with positive pressure.  The patient was allowed to awaken and sent to the postanesthesia care unit in stable condition.  All  counts were correct per operating room staff.  [name], MD  Colorectal and General Surgery [facility] Surgery

## 2023-08-14 NOTE — Transfer of Care (Signed)
Immediate Anesthesia Transfer of Care Note  Patient: Nicole Fleming  Procedure(s) Performed: Procedure(s) (LRB): SINGLE COLUMN HEMORRHOIDECTOMY (N/A)  Patient Location: PACU  Anesthesia Type: MAC  Level of Consciousness: awake, alert , oriented and patient cooperative  Airway & Oxygen Therapy: Patient Spontanous Breathing and Patient connected to face mask oxygen  Post-op Assessment: Report given to PACU RN and Post -op Vital signs reviewed and stable  Post vital signs: Reviewed and stable  Complications: No apparent anesthesia complications  Last Vitals:  Vitals Value Taken Time  BP 128/79 08/14/23 1145  Temp 36.5 C 08/14/23 1140  Pulse 89 08/14/23 1147  Resp 16 08/14/23 1147  SpO2 96 % 08/14/23 1147  Vitals shown include unfiled device data.  Last Pain:  Vitals:   08/14/23 0758  TempSrc: Oral         Complications: No notable events documented.

## 2023-08-14 NOTE — Discharge Instructions (Addendum)
Beginning the day after surgery:  You may sit in a tub of warm water 2-3 times a day to relieve discomfort.  Eat a regular diet high in fiber.  Avoid foods that give you constipation or diarrhea.  Avoid foods that are difficult to digest, such as seeds, nuts, corn or popcorn.  Do not go any longer than 2 days without a bowel movement.  You may take a dose of Milk of Magnesia if you become constipated.    Drink 6-8 glasses of water daily.  Walking is encouraged.  Avoid strenuous activity and heavy lifting for one month after surgery.    Call the office if you have any questions or concerns.  Call immediately if you develop:  Excessive rectal bleeding (more than a cup or passing large clots) Increased discomfort Fever greater than 100 F Difficulty urinating     Post Anesthesia Home Care Instructions  Activity: Get plenty of rest for the remainder of the day. A responsible individual must stay with you for 24 hours following the procedure.  For the next 24 hours, DO NOT: -Drive a car -Advertising copywriter -Drink alcoholic beverages -Take any medication unless instructed by your physician -Make any legal decisions or sign important papers.  Meals: Start with liquid foods such as gelatin or soup. Progress to regular foods as tolerated. Avoid greasy, spicy, heavy foods. If nausea and/or vomiting occur, drink only clear liquids until the nausea and/or vomiting subsides. Call your physician if vomiting continues.  Special Instructions/Symptoms: Your throat may feel dry or sore from the anesthesia or the breathing tube placed in your throat during surgery. If this causes discomfort, gargle with warm salt water. The discomfort should disappear within 24 hours.  No acetaminophen/Tylenol until after 2 pm today if needed. No ibuprofen, Advil, Aleve, Motrin, ketorolac, meloxicam, naproxen, or other NSAIDS until after 2 pm today if needed.     Information for Discharge Teaching: EXPAREL  (bupivacaine liposome injectable suspension)   Pain relief is important to your recovery. The goal is to control your pain so you can move easier and return to your normal activities as soon as possible after your procedure. Your physician may use several types of medicines to manage pain, swelling, and more.  Your surgeon or anesthesiologist gave you EXPAREL(bupivacaine) to help control your pain after surgery.  EXPAREL is a local anesthetic designed to release slowly over an extended period of time to provide pain relief by numbing the tissue around the surgical site. EXPAREL is designed to release pain medication over time and can control pain for up to 72 hours. Depending on how you respond to EXPAREL, you may require less pain medication during your recovery. EXPAREL can help reduce or eliminate the need for opioids during the first few days after surgery when pain relief is needed the most. EXPAREL is not an opioid and is not addictive. It does not cause sleepiness or sedation.   Important! A teal colored band has been placed on your arm with the date, time and amount of EXPAREL you have received. Please leave this armband in place for the full 96 hours following administration, and then you may remove the band. If you return to the hospital for any reason within 96 hours following the administration of EXPAREL, the armband provides important information that your health care providers to know, and alerts them that you have received this anesthetic.    Possible side effects of EXPAREL: Temporary loss of sensation or ability to move in  the area where medication was injected. Nausea, vomiting, constipation Rarely, numbness and tingling in your mouth or lips, lightheadedness, or anxiety may occur. Call your doctor right away if you think you may be experiencing any of these sensations, or if you have other questions regarding possible side effects.  Follow all other discharge instructions  given to you by your surgeon or nurse. Eat a healthy diet and drink plenty of water or other fluids.

## 2023-08-14 NOTE — Anesthesia Postprocedure Evaluation (Signed)
Anesthesia Post Note  Patient: KEYSHLA LAVALLE  Procedure(s) Performed: ATTEMPTED SINGLE COLUMN HEMORRHOIDECTOMY (Rectum)     Patient location during evaluation: PACU Anesthesia Type: MAC Level of consciousness: awake and alert, patient cooperative and oriented Pain management: pain level controlled Vital Signs Assessment: post-procedure vital signs reviewed and stable Respiratory status: spontaneous breathing, nonlabored ventilation and respiratory function stable (pt with intermittent hoarseness, ?throat irritation from emesis) Cardiovascular status: blood pressure returned to baseline and stable Postop Assessment: no apparent nausea or vomiting, adequate PO intake and able to ambulate Anesthetic complications: no   No notable events documented.  Last Vitals:  Vitals:   08/14/23 1209 08/14/23 1315  BP:  124/72  Pulse:  92  Resp:  20  Temp:    SpO2: 97% 100%    Last Pain:  Vitals:   08/14/23 1200  TempSrc:   PainSc: 0-No pain                 Karn Derk,E. Lilybeth Vien

## 2023-08-14 NOTE — OR Nursing (Signed)
Patient received nebulizer treatment per orders from Dr. Jairo Ben.Post treatment oxygen saturation levels 99-100%.  Dr. Jean Rosenthal assesses patient and orders received to discharge patient to home. Margarita Mail rn

## 2023-08-14 NOTE — Interval H&P Note (Signed)
History and Physical Interval Note:  08/14/2023 7:33 AM  Nicole Fleming  has presented today for surgery, with the diagnosis of grade 4 hemorrhoids.  The various methods of treatment have been discussed with the patient and family. After consideration of risks, benefits and other options for treatment, the patient has consented to  Procedure(s): SINGLE COLUMN HEMORRHOIDECTOMY (N/A) as a surgical intervention.  The patient's history has been reviewed, patient examined, no change in status, stable for surgery.  I have reviewed the patient's chart and labs.  Questions were answered to the patient's satisfaction.     [name], MD  Colorectal and General Surgery [facility] Surgery

## 2023-08-14 NOTE — Anesthesia Procedure Notes (Signed)
Procedure Name: MAC Date/Time: 08/14/2023 11:07 AM  Performed by: Francie Massing, CRNAPre-anesthesia Checklist: Patient identified, Emergency Drugs available, Suction available, Patient being monitored and Timeout performed Oxygen Delivery Method: Simple face mask

## 2023-08-15 ENCOUNTER — Encounter (HOSPITAL_BASED_OUTPATIENT_CLINIC_OR_DEPARTMENT_OTHER): Payer: Self-pay | Admitting: General Surgery

## 2023-09-03 ENCOUNTER — Ambulatory Visit (HOSPITAL_BASED_OUTPATIENT_CLINIC_OR_DEPARTMENT_OTHER): Payer: BC Managed Care – PPO | Admitting: Obstetrics & Gynecology

## 2023-10-02 ENCOUNTER — Ambulatory Visit: Payer: Self-pay | Admitting: General Surgery

## 2023-10-02 NOTE — Progress Notes (Addendum)
COVID Vaccine Completed:  Yes  Date of COVID positive in last 90 days:  No  PCP - No PCP Cardiologist -  Nanetta Batty, MD (last OV 2023)  Chest x-ray - N/A EKG - N/A Stress Test - N/A ECHO - 07-03-21 Epic Cardiac Cath - N/A Pacemaker/ICD device last checked: Spinal Cord Stimulator:  N/A Cardiac CT - 09-10-21 Epic Long Term Monitor - 06-20-21 Epic  Bowel Prep -  N/A  Sleep Study - N/A CPAP -   Fasting Blood Sugar - N/A Checks Blood Sugar _____ times a day  Last dose of GLP1 agonist-  N/A GLP1 instructions:  Hold 7 days before surgery    Last dose of SGLT-2 inhibitors-  N/A SGLT-2 instructions:  Hold 3 days before surgery    Blood Thinner Instructions:  N/A Aspirin Instructions: Last Dose:  Activity level:  Can go up a flight of stairs and perform activities of daily living without stopping and without symptoms of chest pain or shortness of breath.  Anesthesia review:  Surgery on 12/24 canceled due to patient coughing and vomiting during surgery.    Evaluated by cardiology for palpatations, no follow up needed after negative testing.  Patient denies shortness of breath, fever, cough and chest pain at PAT appointment  Patient verbalized understanding of instructions that were given to them at the PAT appointment. Patient was also instructed that they will need to review over the PAT instructions again at home before surgery.

## 2023-10-02 NOTE — Progress Notes (Signed)
Surgery orders requested via Epic inbox.

## 2023-10-02 NOTE — Patient Instructions (Addendum)
SURGICAL WAITING ROOM VISITATION Patients having surgery or a procedure may have no more than 2 support people in the waiting area - these visitors may rotate.    Children under the age of 58 must have an adult with them who is not the patient.  Due to an increase in RSV and influenza rates and associated hospitalizations, children ages 39 and under may not visit patients in [facility] hospitals.   If the patient needs to stay at the hospital during part of their recovery, the visitor guidelines for inpatient rooms apply. Pre-op nurse will coordinate an appropriate time for 1 support person to accompany patient in pre-op.  This support person may not rotate.    Please refer to the [facility] website for the visitor guidelines for Inpatients (after your surgery is over and you are in a regular room).       Your procedure is scheduled on: 10-09-23   Report to [facility] Main Entrance    Report to admitting at 5:15 AM   Call this number if you have problems the morning of surgery [phone]   Do not eat food or drink liquids :After Midnight.            If you have questions, please contact your surgeon's office.   FOLLOW BOWEL PREP AND ANY ADDITIONAL PRE OP INSTRUCTIONS YOU RECEIVED FROM YOUR SURGEON'S OFFICE!!!     Oral Hygiene is also important to reduce your risk of infection.                                    Remember - BRUSH YOUR TEETH THE MORNING OF SURGERY WITH YOUR REGULAR TOOTHPASTE   Do NOT smoke after Midnight   Take these medicines the morning of surgery with A SIP OF WATER:    Citalopram   Okay to use inhalers  Stop all vitamins and herbal supplements 7 days before surgery                              You may not have any metal on your body including hair pins, jewelry, and body piercing             Do not wear make-up, lotions, powders, perfumes, or deodorant  Do not wear nail polish including gel and S&S, artificial/acrylic nails, or any other  type of covering on natural nails including finger and toenails. If you have artificial nails, gel coating, etc. that needs to be removed by a nail salon please have this removed prior to surgery or surgery may need to be canceled/ delayed if the surgeon/ anesthesia feels like they are unable to be safely monitored.   Do not shave  48 hours prior to surgery.         Do not bring valuables to the hospital. [facility] IS NOT RESPONSIBLE   FOR VALUABLES.   Contacts, dentures or bridgework may not be worn into surgery.  DO NOT BRING YOUR HOME MEDICATIONS TO THE HOSPITAL. PHARMACY WILL DISPENSE MEDICATIONS LISTED ON YOUR MEDICATION LIST TO YOU DURING YOUR ADMISSION IN THE HOSPITAL!    Patients discharged on the day of surgery will not be allowed to drive home.  Someone NEEDS to stay with you for the first 24 hours after anesthesia.               Please  read over the following fact sheets you were given: IF YOU HAVE QUESTIONS ABOUT YOUR PRE-OP INSTRUCTIONS PLEASE CALL [phone] G[facility]  If you received a COVID test during your pre-op visit  it is requested that you wear a mask when out in public, stay away from anyone that may not be feeling well and notify your surgeon if you develop symptoms. If you test positive for Covid or have been in contact with anyone that has tested positive in the last 10 days please notify you surgeon.   - Preparing for Surgery Before surgery, you can play an important role.  Because skin is not sterile, your skin needs to be as free of germs as possible.  You can reduce the number of germs on your skin by washing with CHG (chlorahexidine gluconate) soap before surgery.  CHG is an antiseptic cleaner which kills germs and bonds with the skin to continue killing germs even after washing. Please DO NOT use if you have an allergy to CHG or antibacterial soaps.  If your skin becomes reddened/irritated stop using the CHG and inform your nurse when you arrive at Short  Stay. Do not shave (including legs and underarms) for at least 48 hours prior to the first CHG shower.  You may shave your face/neck.  Please follow these instructions carefully:  1.  Shower with CHG Soap the night before surgery and the  morning of surgery.  2.  If you choose to wash your hair, wash your hair first as usual with your normal  shampoo.  3.  After you shampoo, rinse your hair and body thoroughly to remove the shampoo.                             4.  Use CHG as you would any other liquid soap.  You can apply chg directly to the skin and wash.  Gently with a scrungie or clean washcloth.  5.  Apply the CHG Soap to your body ONLY FROM THE NECK DOWN.   Do   not use on face/ open                           Wound or open sores. Avoid contact with eyes, ears mouth and   genitals (private parts).                       Wash face,  Genitals (private parts) with your normal soap.             6.  Wash thoroughly, paying special attention to the area where your    surgery  will be performed.  7.  Thoroughly rinse your body with warm water from the neck down.  8.  DO NOT shower/wash with your normal soap after using and rinsing off the CHG Soap.                9.  Pat yourself dry with a clean towel.            10.  Wear clean pajamas.            11.  Place clean sheets on your bed the night of your first shower and do not  sleep with pets. Day of Surgery : Do not apply any lotions/deodorants the morning of surgery.  Please wear clean clothes to the hospital/surgery center.  FAILURE TO FOLLOW  THESE INSTRUCTIONS MAY RESULT IN THE CANCELLATION OF YOUR SURGERY  PATIENT SIGNATURE_________________________________  NURSE SIGNATURE__________________________________  ________________________________________________________________________

## 2023-10-06 ENCOUNTER — Encounter (HOSPITAL_COMMUNITY): Payer: Self-pay

## 2023-10-06 ENCOUNTER — Other Ambulatory Visit: Payer: Self-pay

## 2023-10-06 ENCOUNTER — Encounter (HOSPITAL_COMMUNITY)
Admission: RE | Admit: 2023-10-06 | Discharge: 2023-10-06 | Disposition: A | Discharge status: Home / Self Care | Payer: 59 | Source: Ambulatory Visit | Attending: General Surgery | Admitting: General Surgery

## 2023-10-06 VITALS — BP 133/95 | HR 69 | Temp 98.1°F | Resp 16 | Ht 68.0 in | Wt 166.8 lb

## 2023-10-06 DIAGNOSIS — F32A Depression, unspecified: Secondary | ICD-10-CM | POA: Insufficient documentation

## 2023-10-06 DIAGNOSIS — Z79899 Other long term (current) drug therapy: Secondary | ICD-10-CM | POA: Diagnosis not present

## 2023-10-06 DIAGNOSIS — K643 Fourth degree hemorrhoids: Secondary | ICD-10-CM | POA: Insufficient documentation

## 2023-10-06 DIAGNOSIS — J45909 Unspecified asthma, uncomplicated: Secondary | ICD-10-CM | POA: Insufficient documentation

## 2023-10-06 DIAGNOSIS — F419 Anxiety disorder, unspecified: Secondary | ICD-10-CM | POA: Insufficient documentation

## 2023-10-06 DIAGNOSIS — Z8679 Personal history of other diseases of the circulatory system: Secondary | ICD-10-CM | POA: Diagnosis not present

## 2023-10-06 DIAGNOSIS — Z8719 Personal history of other diseases of the digestive system: Secondary | ICD-10-CM | POA: Insufficient documentation

## 2023-10-06 DIAGNOSIS — Z01812 Encounter for preprocedural laboratory examination: Secondary | ICD-10-CM | POA: Diagnosis present

## 2023-10-06 DIAGNOSIS — Z01818 Encounter for other preprocedural examination: Secondary | ICD-10-CM

## 2023-10-06 HISTORY — DX: Other complications of anesthesia, initial encounter: T88.59XA

## 2023-10-06 LAB — CBC
HCT: 42.6 % (ref 36.0–46.0)
Hemoglobin: 13.5 g/dL (ref 12.0–15.0)
MCH: 27 pg (ref 26.0–34.0)
MCHC: 31.7 g/dL (ref 30.0–36.0)
MCV: 85.2 fL (ref 80.0–100.0)
Platelets: 236 10*3/uL (ref 150–400)
RBC: 5 MIL/uL (ref 3.87–5.11)
RDW: 13.3 % (ref 11.5–15.5)
WBC: 5.5 10*3/uL (ref 4.0–10.5)
nRBC: 0 % (ref 0.0–0.2)

## 2023-10-06 NOTE — Progress Notes (Signed)
Case: [id number] Date/Time: 10/09/23 0700   Procedure: SINGLE COLUMN HEMORRHOIDECTOMY   Anesthesia type: Monitor Anesthesia Care   Pre-op diagnosis: grade 4 hemorrhoids   [facility] ROOM 08 / [facility]   Surgeons: Romie Levee, MD       DISCUSSION: Nicole Fleming is a 63 yo female who presents to PAT prior to surgery above. PMH of  MVP (not seen on 07/03/21 echo), palpitations, asthma, schatzki's ring s/p dilation, hiatal hernia s/p repair (2023), anxiety, depression.  Prior complication from anesthesia includes coughing and vomiting during surgery on 08/14/23 which was at the surgery center. Per Dr. Maisie Fus: "patient began coughing quite a bit and had bilious emesis.  She was suctioned immediately but the procedure was halted.  She did not have any desaturations during this time.  We then placed her back on the operative gurney and she was evaluated by the CRNA and anesthesiologist.  She did not appear to have any significant aspiration event, but the decision was made to hold off on proceeding with a hemorrhoidectomy as she would need to be intubated for this and we worried about forcing bile into her lungs with positive pressure"  Patient has had a previous cardiac evaluation in 2022/2023 for palpitations. Palpitations noted post-COVID 07/2020. She had also reported previous history of MVP. She underwent a Zio monitor and echo. 05/2021 monitor showed sinus bradycardia-sinus tachycardia, occasional PACs/PVCs, short runs of SVT. 06/2021 echo showed LVEF 60-65%, no regional wall motion abnormalities, normal RVSF, possible mild myxomatous changes of the MV but no MVP, no MR or MS, mild-moderate TR. Her palpitations resolved after a stressful colleague left her workplace. She also had a coronary calcium score, last on 09/10/21 it was 38 (81st percentile) and also showed a large hiatal hernia. She was started on statin therapy. He also referred her to GI. Last cardiology input is from [place], Wynema Birch, [place] on  10/30/21 prior to EGD and no additional cardiac testing recommended at that time. Statin was held in May due to side effects.   VS: BP (!) 133/95   Pulse 69   Temp 36.7 C (Oral)   Resp 16   Ht 5\' 8"  (1.727 m)   Wt 75.7 kg   LMP 11/26/2011   SpO2 99%   BMI 25.36 kg/m   PROVIDERS: Patient, No Pcp Per   LABS: Labs reviewed: Acceptable for surgery. (all labs ordered are listed, but only abnormal results are displayed)  Labs Reviewed  CBC     IMAGES:   EKG:   CV:  Past Medical History:  Diagnosis Date   Abnormal pap 08/18/1998   h/o LEEP   Anxiety    Asthma    Complication of anesthesia    Cough and vomiting case had to be stopped 08-14-23   Contact lens/glasses fitting    wears contacts or glasses   Depression    and anxiety   Hemorrhoids, external    Hiatal hernia    History of palpitations    not currently   IBS (irritable bowel syndrome)    questionable gluten allergy   Mitral valve prolapse 08/18/2004   at 63 yo, echo 2013-no MVP-normal valve-no regurg   Schatzki's ring 08/18/2005   of esophagus    Past Surgical History:  Procedure Laterality Date   CESAREAN SECTION  1990   CESAREAN SECTION  1992   CHOLECYSTECTOMY  12/2005   ESOPHAGEAL DILATION  1/07   ESOPHAGEAL MANOMETRY N/A 07/23/2022   Procedure: ESOPHAGEAL MANOMETRY (EM);  Surgeon: Orvan Falconer,  Cala Bradford, MD;  Location: [facility];  Service: Gastroenterology;  Laterality: N/A;   HEMORRHOID SURGERY N/A 03/01/2013   Procedure: HEMORRHOIDECTOMY;  Surgeon: Wilmon Arms. Corliss Skains, MD;  Location: [facility];  Service: General;  Laterality: N/A;   HEMORRHOID SURGERY N/A 08/14/2023   Procedure: ATTEMPTED SINGLE COLUMN HEMORRHOIDECTOMY;  Surgeon: Romie Levee, MD;  [facility];  Service: General;  Laterality: N/A;   LEEP  1999   lipoma removal  10/29/2011   abdominal wall   SKIN BIOPSY Left 03/01/2013   Procedure: SKIN BIOPSY  X 2  ABDOMINAL WALL;  Surgeon: Wilmon Arms.  Corliss Skains, MD;  Location: [facility];  Service: General;  Laterality: Left;   TUBAL LIGATION     XI ROBOTIC ASSISTED HIATAL HERNIA REPAIR N/A 08/05/2022   Procedure: ROBOTIC HIATAL HERNIA REPAIR WITH MESH AND FUNDOPLICATION;  Surgeon: Axel Filler, MD;  Location: [facility];  Service: General;  Laterality: N/A;    MEDICATIONS:  albuterol (PROAIR HFA) 108 (90 Base) MCG/ACT inhaler   budesonide-formoterol (SYMBICORT) 160-4.5 MCG/ACT inhaler   citalopram (CELEXA) 20 MG tablet   diphenhydrAMINE (BENADRYL) 25 MG tablet   estradiol (ESTRACE) 0.1 MG/GM vaginal cream   No current facility-administered medications for this encounter.   Marcille Blanco [facility] Short Stay/Anesthesiology [facility] Phone [phone] 10/06/2023 3:12 PM

## 2023-10-08 NOTE — Anesthesia Preprocedure Evaluation (Signed)
Anesthesia Evaluation  Patient identified by MRN, date of birth, ID band Patient awake    Reviewed: Allergy & Precautions, H&P , NPO status , Patient's Chart, lab work & pertinent test results  Airway Mallampati: II  TM Distance: >3 FB Neck ROM: Full    Dental no notable dental hx.    Pulmonary asthma    Pulmonary exam normal breath sounds clear to auscultation       Cardiovascular + CAD  Normal cardiovascular exam Rhythm:Regular Rate:Normal  TTE 2022  IMPRESSIONS     1. Left ventricular ejection fraction, by estimation, is 60 to 65%. The  left ventricle has normal function. The left ventricle has no regional  wall motion abnormalities. Left ventricular diastolic parameters were  normal.   2. Right ventricular systolic function is normal. The right ventricular  size is normal.   3. Possible mild myxomatous changes of the mitral valve, but prolapse is  not seen. The mitral valve is normal in structure. No evidence of mitral  valve regurgitation. No evidence of mitral stenosis.   4. Tricuspid valve regurgitation is mild to moderate.   5. The aortic valve is tricuspid. Aortic valve regurgitation is not  visualized. No aortic stenosis is present.   6. The inferior vena cava is normal in size with greater than 50%  respiratory variability, suggesting right atrial pressure of 3 mmHg.     Neuro/Psych  PSYCHIATRIC DISORDERS Anxiety Depression    negative neurological ROS     GI/Hepatic Neg liver ROS, hiatal hernia,GERD  ,,S/P Nissen fundoplication (without gastrostomy tube) procedure   Endo/Other  negative endocrine ROS    Renal/GU negative Renal ROS  negative genitourinary   Musculoskeletal negative musculoskeletal ROS (+)    Abdominal   Peds negative pediatric ROS (+)  Hematology negative hematology ROS (+)   Anesthesia Other Findings   Reproductive/Obstetrics negative OB ROS                              Anesthesia Physical Anesthesia Plan  ASA: 3  Anesthesia Plan: MAC   Post-op Pain Management:    Induction: Intravenous  PONV Risk Score and Plan: 2 and Propofol infusion, Treatment may vary due to age or medical condition and Ondansetron  Airway Management Planned: Natural Airway  Additional Equipment:   Intra-op Plan:   Post-operative Plan:   Informed Consent: I have reviewed the patients History and Physical, chart, labs and discussed the procedure including the risks, benefits and alternatives for the proposed anesthesia with the patient or authorized representative who has indicated his/her understanding and acceptance.     Dental advisory given  Plan Discussed with: CRNA  Anesthesia Plan Comments:         Anesthesia Quick Evaluation

## 2023-10-09 ENCOUNTER — Ambulatory Visit (HOSPITAL_COMMUNITY): Payer: Self-pay

## 2023-10-09 ENCOUNTER — Ambulatory Visit (HOSPITAL_COMMUNITY): Payer: Self-pay | Admitting: Medical

## 2023-10-09 ENCOUNTER — Ambulatory Visit (HOSPITAL_COMMUNITY)
Admission: RE | Admit: 2023-10-09 | Discharge: 2023-10-09 | Disposition: A | Discharge status: Home / Self Care | Payer: 59 | Attending: General Surgery | Admitting: General Surgery

## 2023-10-09 ENCOUNTER — Other Ambulatory Visit: Payer: Self-pay

## 2023-10-09 ENCOUNTER — Other Ambulatory Visit: Payer: Self-pay | Admitting: General Surgery

## 2023-10-09 ENCOUNTER — Encounter (HOSPITAL_COMMUNITY)
Admission: RE | Disposition: A | Discharge status: Home / Self Care | Payer: Self-pay | Source: Home / Self Care | Attending: General Surgery

## 2023-10-09 ENCOUNTER — Encounter (HOSPITAL_COMMUNITY): Payer: Self-pay | Admitting: General Surgery

## 2023-10-09 DIAGNOSIS — F419 Anxiety disorder, unspecified: Secondary | ICD-10-CM | POA: Diagnosis not present

## 2023-10-09 DIAGNOSIS — F32A Depression, unspecified: Secondary | ICD-10-CM | POA: Insufficient documentation

## 2023-10-09 DIAGNOSIS — J45909 Unspecified asthma, uncomplicated: Secondary | ICD-10-CM | POA: Diagnosis not present

## 2023-10-09 DIAGNOSIS — F418 Other specified anxiety disorders: Secondary | ICD-10-CM

## 2023-10-09 DIAGNOSIS — I2510 Atherosclerotic heart disease of native coronary artery without angina pectoris: Secondary | ICD-10-CM

## 2023-10-09 DIAGNOSIS — K643 Fourth degree hemorrhoids: Secondary | ICD-10-CM

## 2023-10-09 DIAGNOSIS — Z9889 Other specified postprocedural states: Secondary | ICD-10-CM | POA: Insufficient documentation

## 2023-10-09 DIAGNOSIS — K219 Gastro-esophageal reflux disease without esophagitis: Secondary | ICD-10-CM | POA: Diagnosis not present

## 2023-10-09 DIAGNOSIS — K449 Diaphragmatic hernia without obstruction or gangrene: Secondary | ICD-10-CM | POA: Diagnosis not present

## 2023-10-09 HISTORY — PX: HEMORRHOID SURGERY: SHX153

## 2023-10-09 SURGERY — HEMORRHOIDECTOMY
Anesthesia: Monitor Anesthesia Care

## 2023-10-09 MED ORDER — SODIUM CHLORIDE 0.9% FLUSH
3.0000 mL | Freq: Two times a day (BID) | INTRAVENOUS | Status: DC
Start: 2023-10-09 — End: 2023-10-09

## 2023-10-09 MED ORDER — ACETAMINOPHEN 500 MG PO TABS: 1000.0000 mg | ORAL_TABLET | ORAL | Status: AC

## 2023-10-09 MED ORDER — SODIUM CHLORIDE 0.9% FLUSH: 3.0000 mL | Freq: Two times a day (BID) | INTRAVENOUS | Status: DC

## 2023-10-09 MED ORDER — DROPERIDOL 2.5 MG/ML IJ SOLN: 0.6250 mg | Freq: Once | INTRAMUSCULAR | Status: DC | PRN

## 2023-10-09 MED ORDER — OXYCODONE HCL 5 MG PO TABS: 5.0000 mg | ORAL_TABLET | Freq: Once | ORAL | Status: DC | PRN

## 2023-10-09 MED ORDER — DEXAMETHASONE SODIUM PHOSPHATE 4 MG/ML IJ SOLN
INTRAMUSCULAR | Status: DC | PRN
  Administered 2023-10-09: 8 mg via INTRAVENOUS

## 2023-10-09 MED ORDER — LACTATED RINGERS IV SOLN: INTRAVENOUS | Status: DC | PRN

## 2023-10-09 MED ORDER — FENTANYL CITRATE PF 50 MCG/ML IJ SOSY: 25.0000 ug | PREFILLED_SYRINGE | INTRAMUSCULAR | Status: DC | PRN

## 2023-10-09 MED ORDER — PROPOFOL 10 MG/ML IV BOLUS
INTRAVENOUS | Status: AC
  Filled 2023-10-09: qty 20

## 2023-10-09 MED ORDER — CELECOXIB 200 MG PO CAPS
200.0000 mg | ORAL_CAPSULE | ORAL | Status: AC
  Administered 2023-10-09: 200 mg via ORAL
  Filled 2023-10-09: qty 1

## 2023-10-09 MED ORDER — 0.9 % SODIUM CHLORIDE (POUR BTL) OPTIME
TOPICAL | Status: DC | PRN
  Administered 2023-10-09: 1000 mL

## 2023-10-09 MED ORDER — BUPIVACAINE-EPINEPHRINE 0.5% -1:200000 IJ SOLN
INTRAMUSCULAR | Status: DC | PRN
  Administered 2023-10-09: 30 mL

## 2023-10-09 MED ORDER — ACETAMINOPHEN 500 MG PO TABS
1000.0000 mg | ORAL_TABLET | Freq: Once | ORAL | Status: AC
  Administered 2023-10-09: 500 mg via ORAL
  Filled 2023-10-09: qty 2

## 2023-10-09 MED ORDER — OXYCODONE HCL 5 MG/5ML PO SOLN: 5.0000 mg | Freq: Once | ORAL | Status: DC | PRN

## 2023-10-09 MED ORDER — BUPIVACAINE LIPOSOME 1.3 % IJ SUSP: 20.0000 mL | Freq: Once | INTRAMUSCULAR | Status: DC

## 2023-10-09 MED ORDER — BUPIVACAINE LIPOSOME 1.3 % IJ SUSP
INTRAMUSCULAR | Status: DC | PRN
  Administered 2023-10-09: 20 mL

## 2023-10-09 MED ORDER — LACTATED RINGERS IV SOLN: INTRAVENOUS | Status: DC

## 2023-10-09 MED ORDER — FENTANYL CITRATE (PF) 100 MCG/2ML IJ SOLN
INTRAMUSCULAR | Status: DC | PRN
  Administered 2023-10-09: 100 ug via INTRAVENOUS

## 2023-10-09 MED ORDER — SODIUM CHLORIDE 0.9% FLUSH: 3.0000 mL | INTRAVENOUS | Status: DC | PRN

## 2023-10-09 MED ORDER — BUPIVACAINE LIPOSOME 1.3 % IJ SUSP
INTRAMUSCULAR | Status: AC
  Filled 2023-10-09: qty 20

## 2023-10-09 MED ORDER — BUPIVACAINE-EPINEPHRINE (PF) 0.5% -1:200000 IJ SOLN
INTRAMUSCULAR | Status: AC
  Filled 2023-10-09: qty 30

## 2023-10-09 MED ORDER — MIDAZOLAM HCL 5 MG/5ML IJ SOLN
INTRAMUSCULAR | Status: DC | PRN
  Administered 2023-10-09: 2 mg via INTRAVENOUS

## 2023-10-09 MED ORDER — PROPOFOL 500 MG/50ML IV EMUL
INTRAVENOUS | Status: DC | PRN
  Administered 2023-10-09: 150 ug/kg/min via INTRAVENOUS
  Administered 2023-10-09: 50 mg via INTRAVENOUS

## 2023-10-09 MED ORDER — OXYCODONE HCL 5 MG PO TABS: 5.0000 mg | ORAL_TABLET | Freq: Four times a day (QID) | ORAL | 0 refills | Status: DC | PRN

## 2023-10-09 MED ORDER — FENTANYL CITRATE (PF) 100 MCG/2ML IJ SOLN
INTRAMUSCULAR | Status: AC
  Filled 2023-10-09: qty 2

## 2023-10-09 MED ORDER — ONDANSETRON HCL 4 MG/2ML IJ SOLN
INTRAMUSCULAR | Status: DC | PRN
  Administered 2023-10-09: 4 mg via INTRAVENOUS

## 2023-10-09 MED ORDER — ORAL CARE MOUTH RINSE: 15.0000 mL | Freq: Once | OROMUCOSAL | Status: AC

## 2023-10-09 MED ORDER — CHLORHEXIDINE GLUCONATE 0.12 % MT SOLN
15.0000 mL | Freq: Once | OROMUCOSAL | Status: AC
  Administered 2023-10-09: 15 mL via OROMUCOSAL

## 2023-10-09 MED ORDER — GABAPENTIN 300 MG PO CAPS
300.0000 mg | ORAL_CAPSULE | ORAL | Status: AC
  Administered 2023-10-09: 300 mg via ORAL
  Filled 2023-10-09: qty 1

## 2023-10-09 MED ORDER — MIDAZOLAM HCL 2 MG/2ML IJ SOLN
INTRAMUSCULAR | Status: AC
  Filled 2023-10-09: qty 2

## 2023-10-09 MED ORDER — ACETAMINOPHEN 10 MG/ML IV SOLN: 1000.0000 mg | Freq: Once | INTRAVENOUS | Status: DC | PRN

## 2023-10-09 SURGICAL SUPPLY — 28 items
BAG COUNTER SPONGE SURGICOUNT (BAG) IMPLANT
BLADE SURG 15 STRL LF DISP TIS (BLADE) ×1 IMPLANT
BLADE SURG SZ10 CARB STEEL (BLADE) ×1 IMPLANT
DRAIN PENROSE 0.25X18 (DRAIN) IMPLANT
DRAPE LAPAROTOMY T 98X78 PEDS (DRAPES) ×1 IMPLANT
ELECT REM PT RETURN 15FT ADLT (MISCELLANEOUS) ×1 IMPLANT
GAUZE 4X4 16PLY ~~LOC~~+RFID DBL (SPONGE) ×1 IMPLANT
GAUZE PAD ABD 8X10 STRL (GAUZE/BANDAGES/DRESSINGS) IMPLANT
GAUZE SPONGE 4X4 12PLY STRL (GAUZE/BANDAGES/DRESSINGS) IMPLANT
GLOVE BIO SURGEON STRL SZ 6.5 (GLOVE) ×1 IMPLANT
GLOVE INDICATOR 6.5 STRL GRN (GLOVE) ×2 IMPLANT
GOWN STRL REUS W/ TWL XL LVL3 (GOWN DISPOSABLE) ×2 IMPLANT
KIT BASIN OR (CUSTOM PROCEDURE TRAY) ×1 IMPLANT
KIT TURNOVER KIT A (KITS) ×1 IMPLANT
NDL HYPO 22X1.5 SAFETY MO (MISCELLANEOUS) IMPLANT
NEEDLE HYPO 22X1.5 SAFETY MO (MISCELLANEOUS) ×1
PACK GENERAL/GYN (CUSTOM PROCEDURE TRAY) ×1 IMPLANT
PACK LITHOTOMY IV (CUSTOM PROCEDURE TRAY) ×1 IMPLANT
PENCIL SMOKE EVACUATOR (MISCELLANEOUS) IMPLANT
SET IRRIG Y TYPE TUR BLADDER L (SET/KITS/TRAYS/PACK) ×1 IMPLANT
SURGILUBE 2OZ TUBE FLIPTOP (MISCELLANEOUS) ×1 IMPLANT
SUT CHROMIC 2 0 SH (SUTURE) ×1 IMPLANT
SUT CHROMIC 3 0 SH 27 (SUTURE) ×1 IMPLANT
SUT SILK 2-0 18XBRD TIE 12 (SUTURE) IMPLANT
SUT VICRYL 0 UR6 27IN ABS (SUTURE) IMPLANT
SYR 20ML LL LF (SYRINGE) IMPLANT
TOWEL OR 17X26 10 PK STRL BLUE (TOWEL DISPOSABLE) ×1 IMPLANT
WATER STERILE IRR 500ML POUR (IV SOLUTION) ×1 IMPLANT

## 2023-10-09 NOTE — Transfer of Care (Signed)
Immediate Anesthesia Transfer of Care Note  Patient: Nicole Fleming  Procedure(s) Performed: SINGLE COLUMN HEMORRHOIDECTOMY  Patient Location: PACU  Anesthesia Type:MAC  Level of Consciousness: awake and alert   Airway & Oxygen Therapy: Patient Spontanous Breathing and Patient connected to nasal cannula oxygen  Post-op Assessment: Report given to RN  Post vital signs: Reviewed and stable  Last Vitals:  Vitals Value Taken Time  BP 115/69 10/09/23 0800  Temp    Pulse 98 10/09/23 0800  Resp 18   SpO2 97 % 10/09/23 0800  Vitals shown include unfiled device data.  Last Pain:  Vitals:   10/09/23 0628  TempSrc:   PainSc: 0-No pain         Complications: No notable events documented.

## 2023-10-09 NOTE — H&P (Signed)
REFERRING PHYSICIAN:  Jerene Bears., MD   PROVIDER:  Elenora Gamma, MD   MRN: [id number] DOB: 19-May-1961 Subjective    Chief Complaint: Wound Check (NEW PROBLEM - Eval of hems)       History of Present Illness: Nicole Fleming is a 63 y.o. female who is seen today as an office consultation at the request of Dr. Hyacinth Meeker for evaluation of Wound Check (NEW PROBLEM - Eval of hems) .   Patient with approximately 3 months of anal irritation, worse with hard bowel movements.  She has occasional bleeding.  She reports mostly regular bowel habits with occasional diarrhea and constipation.  She has a history of hemorrhoidectomy with Dr. Harlon Flor approximately 10 years ago.     Review of Systems: A complete review of systems was obtained from the patient.  I have reviewed this information and discussed as appropriate with the patient.  See HPI as well for other ROS.       Medical History:     Past Medical History:  Diagnosis Date   Arthritis     Asthma (HHS-HCC)     Hyperlipidemia        There is no problem list on file for this patient.          Past Surgical History:  Procedure Laterality Date   ESOPHAGEAL DILATION   08/2005   CHOLECYSTECTOMY   12/2005   lipoma removal   10/29/2011   Hemmorhoidectomy   03/01/2013    Dr. Corliss Skains   CESAREAN SECTION        [id number]   REPAIR PARAESOPHAGEAL HIATAL HERNIA FUNDOPLICATION W/MESH/PROSTHESIS       Tubal ligation               Allergies  Allergen Reactions   Amoxicillin Rash      Allergic to the Grape flavor in liquid amoxicillin            Current Outpatient Medications on File Prior to Visit  Medication Sig Dispense Refill   citalopram (CELEXA) 20 MG tablet Take 20 mg by mouth once daily       pantoprazole (PROTONIX) 40 MG DR tablet          No current facility-administered medications on file prior to visit.      History reviewed. No pertinent family history.    Social History       Tobacco Use  Smoking  Status Never  Smokeless Tobacco Never      Social History        Socioeconomic History   Marital status: Single  Tobacco Use   Smoking status: Never   Smokeless tobacco: Never  Substance and Sexual Activity   Alcohol use: Never   Drug use: Never      Objective:      Vitals:   10/09/23 0605  BP: 121/87  Pulse: 66  Resp: 16  Temp: 98.1 F (36.7 C)  SpO2: 94%       Exam Gen: NAD Abd: soft Rectal: Prolapse, grade 4 right anterior hemorrhoid     Labs, Imaging and Diagnostic Testing:   Procedure: Anoscopy Surgeon: Maisie Fus After the risks and benefits were explained, written consent was obtained for above procedure.  A medical assistant chaperone was present thoroughout the entire procedure.  Anesthesia: none Diagnosis: Hemorrhoids Findings: Grade 4 right anterior, no other hemorrhoid disease noted.   Assessment and Plan:  Diagnoses and all orders for this visit:   Prolapsed internal hemorrhoids, grade 4  63 year old female with a history of what appears to be a 2 column hemorrhoidectomy in 2014 presents to the office with a grade 4, nonstrangulated internal hemorrhoid with large external component.  I have recommended hemorrhoidectomy.  We discussed this in detail.  All questions were answered.  Risk include bleeding, infection, damage to adjacent structures and recurrence.  All of these risks are very low.   [name], MD Colon and Rectal Surgery [facility]

## 2023-10-09 NOTE — Discharge Instructions (Addendum)

## 2023-10-09 NOTE — Anesthesia Postprocedure Evaluation (Signed)
Anesthesia Post Note  Patient: Nicole Fleming  Procedure(s) Performed: SINGLE COLUMN HEMORRHOIDECTOMY     Patient location during evaluation: PACU Anesthesia Type: MAC Level of consciousness: awake and alert Pain management: pain level controlled Vital Signs Assessment: post-procedure vital signs reviewed and stable Respiratory status: spontaneous breathing, nonlabored ventilation, respiratory function stable and patient connected to nasal cannula oxygen Cardiovascular status: blood pressure returned to baseline and stable Postop Assessment: no apparent nausea or vomiting Anesthetic complications: no   No notable events documented.  Last Vitals:  Vitals:   10/09/23 0830 10/09/23 0902  BP: 136/78 (!) 153/99  Pulse: 92 71  Resp: 15 20  Temp: 36.6 C 36.6 C  SpO2: 98% 100%    Last Pain:  Vitals:   10/09/23 0902  TempSrc:   PainSc: 0-No pain                 Webster Nation

## 2023-10-09 NOTE — Op Note (Signed)
10/09/2023  7:48 AM  PATIENT:  Nicole Fleming  63 y.o. female  Patient Care Team: Patient, No Pcp Per as PCP - General (General Practice) Runell Gess, MD as PCP - Cardiology (Cardiology)  PRE-OPERATIVE DIAGNOSIS:  grade 4 hemorrhoid  POST-OPERATIVE DIAGNOSIS:  grade 4 hemorrhoid  PROCEDURE:  SINGLE COLUMN HEMORRHOIDECTOMY    Surgeon(s): Romie Levee, MD  ASSISTANT: none   ANESTHESIA:   local/MAC  SPECIMEN:  Source of Specimen:  R anterior hemorrhoid  DISPOSITION OF SPECIMEN:  PATHOLOGY  COUNTS:  YES  PLAN OF CARE: Discharge to home after PACU  PATIENT DISPOSITION:  PACU - hemodynamically stable.  INDICATION: 63 y.o. F with grade 4 hemorrhoid   OR FINDINGS: grade 4 RA hemorrhoid  DESCRIPTION: the patient was identified in the preoperative holding area and taken to the OR where they were laid on the operating room table.  MAC anesthesia was induced without difficulty. The patient was then positioned in prone jackknife position with buttocks gently taped apart.  The patient was then prepped and draped in usual sterile fashion.  SCDs were noted to be in place prior to the initiation of anesthesia. A surgical timeout was performed indicating the correct patient, procedure, positioning and need for preoperative antibiotics.  A rectal block was performed using Marcaine with epinephrine mixed with experel.    I began with a digital rectal exam.  The anal canal was dilated gently.  I then placed a Hill-Ferguson anoscope into the anal canal and evaluated this completely.  There was no other pathology noted.  I elevated the hemorrhoid and used scissors to divide the anoderm over the hemorrhoid.  I bluntly dissected the hemorrhoid tissue from the sphincter complex and then excised the hemorrhoid tissue on both sides.  The specimen was sent to pathology and the incision was closed proximally with a 2-0 running chromic to the dentate.  A 3-0 chromic was ran the rest of the way.  The  skin tag was trimmed flat before being closed.  Extra marcaine/Experel was placed around incision for post op pain control.  Hemostasis was good.  A dressing was applied.  She was awakened from anesthesia and sent to the PACU in stable condition.  All counts were correct per OR staff.  [name], MD  Colorectal and General Surgery [facility]

## 2023-10-10 ENCOUNTER — Encounter (HOSPITAL_COMMUNITY): Payer: Self-pay | Admitting: General Surgery

## 2023-10-12 LAB — SURGICAL PATHOLOGY

## 2023-12-24 ENCOUNTER — Other Ambulatory Visit (HOSPITAL_COMMUNITY)
Admission: RE | Admit: 2023-12-24 | Discharge: 2023-12-24 | Disposition: A | Discharge status: Home / Self Care | Source: Ambulatory Visit | Attending: Podiatry | Admitting: Podiatry

## 2023-12-24 ENCOUNTER — Encounter: Payer: Self-pay | Admitting: Podiatry

## 2023-12-24 ENCOUNTER — Ambulatory Visit (INDEPENDENT_AMBULATORY_CARE_PROVIDER_SITE_OTHER)

## 2023-12-24 ENCOUNTER — Ambulatory Visit: Admitting: Podiatry

## 2023-12-24 DIAGNOSIS — M7751 Other enthesopathy of right foot: Secondary | ICD-10-CM | POA: Diagnosis not present

## 2023-12-24 DIAGNOSIS — M778 Other enthesopathies, not elsewhere classified: Secondary | ICD-10-CM

## 2023-12-24 DIAGNOSIS — M25571 Pain in right ankle and joints of right foot: Secondary | ICD-10-CM

## 2023-12-24 DIAGNOSIS — M67471 Ganglion, right ankle and foot: Secondary | ICD-10-CM

## 2023-12-24 NOTE — Progress Notes (Signed)
  Subjective:  Patient ID: Nicole Fleming, female    DOB: 25-May-1961,  MRN: [id number]  Chief Complaint  Patient presents with   Foot Swelling    RM#13 right foot swelling ongoing for several weeks associated with sharp pains no injury to foot or ankle.    Discussed the use of AI scribe software for clinical note transcription with the patient, who gave verbal consent to proceed.  History of Present Illness Nicole Fleming is a 63 year old female who presents with sharp shooting pains in the right foot.  She has experienced sharp shooting pains in her right foot for one week. The pain is severe enough to disrupt sleep but does not impair ambulation. There are no known injuries, changes in footwear, or activity alterations contributing to the pain. She finds relief by wrapping the foot at night while in a recliner and has not used medications like ibuprofen  or anti-inflammatories.  She said the left foot has been doing well not having any issues.  No injuries or other concerns.       Objective:    Physical Exam General: AAO x3, NAD  Dermatological: Skin is warm, dry and supple bilateral. There are no open sores, no preulcerative lesions, no rash or signs of infection present.  Vascular: Dorsalis Pedis artery and Posterior Tibial artery pedal pulses are 2/4 bilateral with immedate capillary fill time.  There is no pain with calf compression, swelling, warmth, erythema.   Neruologic: Grossly intact via light touch bilateral.  Musculoskeletal: There is tenderness palpation in the area of soft tissue mass to the dorsal aspect the foot but also just lateral to this as well.  There is no area pinpoint tenderness.  Clinically appears to be more likely ganglion type cyst.  MMT 5/5.  Gait: Unassisted, Nonantalgic.          Results   RADIOLOGY Foot X-ray: There is no evidence of acute fracture noted.  Arthritic changes present with spurring present of the first MTPJ.    Assessment:   1. Capsulitis of right foot   2. Acute right ankle pain   3. Ganglion cyst of right foot      Plan:  Patient was evaluated and treated and all questions answered.  Assessment and Plan Assessment & Plan Right foot inflammation with cyst - Prescribed meloxicam .   - Advised wearing a walking boot to support the foot and reduce stress which she already has at home. - Performed aspiration of the cyst to drain fluid and relieve pressure with local anesthesia.  Verbal consent obtained.  Cleansed with alcohol.  Mixture of 6 mL of lidocaine , Marcaine  plain.  This is infiltrated to different areas.  Cleansed with Betadine, alcohol.  I then utilized an 18-gauge needle to aspirate jellylike fluid coming from the area.  This was sent to pathology.  Compression bandage applied.  Although this was 2 different syringes that can be sent as 1 specimen is a the same area.     No follow-ups on file.   Charity Conch DPM

## 2023-12-28 LAB — CYTOLOGY - NON PAP

## 2023-12-30 ENCOUNTER — Ambulatory Visit: Payer: Self-pay | Admitting: Podiatry

## 2024-01-07 ENCOUNTER — Ambulatory Visit: Admitting: Podiatry

## 2024-03-14 ENCOUNTER — Ambulatory Visit (INDEPENDENT_AMBULATORY_CARE_PROVIDER_SITE_OTHER)

## 2024-03-14 ENCOUNTER — Ambulatory Visit: Admitting: Podiatry

## 2024-03-14 DIAGNOSIS — K449 Diaphragmatic hernia without obstruction or gangrene: Secondary | ICD-10-CM | POA: Insufficient documentation

## 2024-03-14 DIAGNOSIS — M25571 Pain in right ankle and joints of right foot: Secondary | ICD-10-CM

## 2024-03-14 DIAGNOSIS — M778 Other enthesopathies, not elsewhere classified: Secondary | ICD-10-CM | POA: Diagnosis not present

## 2024-03-14 DIAGNOSIS — M67471 Ganglion, right ankle and foot: Secondary | ICD-10-CM

## 2024-03-14 DIAGNOSIS — R933 Abnormal findings on diagnostic imaging of other parts of digestive tract: Secondary | ICD-10-CM | POA: Insufficient documentation

## 2024-03-14 NOTE — Patient Instructions (Signed)
  I have ordered a MRI of the right ankle. If you do not hear for them about scheduling within the next 1 week, or you have any questions please give [place] a call at [phone].

## 2024-03-16 NOTE — Progress Notes (Signed)
  Subjective:  Patient ID: Nicole Fleming, female    DOB: 06/22/1961,  MRN: [id number] Chief Complaint  Patient presents with   Foot Pain    Pt stated that she is having some swelling and discomfort on the top of her right foot     History of Present Illness Nicole Fleming is a 63 year old female who presents with sharp shooting pains in the right foot.  States that she still gets swelling as well.  Symptoms are intermittent.  No recent injuries or changes otherwise that she reports.     Objective:    Physical Exam General: AAO x3, NAD  Dermatological: Skin is warm, dry and supple bilateral. There are no open sores, no preulcerative lesions, no rash or signs of infection present.  Vascular: Dorsalis Pedis artery and Posterior Tibial artery pedal pulses are 2/4 bilateral with immedate capillary fill time.  There is no pain with calf compression, swelling, warmth, erythema.   Neruologic: Grossly intact via light touch bilateral.  Musculoskeletal: There is tenderness palpation in the area of soft tissue mass to the dorsal aspect the foot but also just lateral to this as well.  There is no area pinpoint tenderness.   Gait: Unassisted, Nonantalgic.    Assessment:   1. Capsulitis of right foot      2. Ganglion cyst of right foot      Plan:  Patient was evaluated and treated and all questions answered.  Assessment and Plan Assessment & Plan Right foot inflammation with cyst -Given ongoing nature of symptoms I recommended MRI which is ordered today.  Will determine further treatment options pending the MRI.  Continue anti-inflammatories as needed as well as offloading.  No follow-ups on file.  Donnice JONELLE Fees DPM

## 2024-04-20 ENCOUNTER — Ambulatory Visit
Admission: RE | Admit: 2024-04-20 | Discharge: 2024-04-20 | Disposition: A | Discharge status: Home / Self Care | Source: Ambulatory Visit | Attending: Podiatry | Admitting: Podiatry

## 2024-04-20 DIAGNOSIS — M778 Other enthesopathies, not elsewhere classified: Secondary | ICD-10-CM

## 2024-04-20 DIAGNOSIS — M67471 Ganglion, right ankle and foot: Secondary | ICD-10-CM

## 2024-04-20 MED ORDER — GADOPICLENOL 0.5 MMOL/ML IV SOLN
5.5000 mL | Freq: Once | INTRAVENOUS | Status: AC | PRN
  Administered 2024-04-20: 5.5 mL via INTRAVENOUS

## 2024-04-22 ENCOUNTER — Ambulatory Visit: Payer: Self-pay | Admitting: Podiatry

## 2024-05-09 ENCOUNTER — Other Ambulatory Visit: Payer: Self-pay | Admitting: Orthopaedic Surgery

## 2024-05-09 ENCOUNTER — Other Ambulatory Visit: Payer: Self-pay

## 2024-05-09 ENCOUNTER — Encounter (HOSPITAL_COMMUNITY): Payer: Self-pay | Admitting: Orthopaedic Surgery

## 2024-05-09 NOTE — Progress Notes (Signed)
 For Anesthesia: PCP - Cleotilde Ronal RAMAN, MD  Cardiologist - Dorn Lesches, MD   Bowel Prep reminder: N/A  Chest x-ray - N/A EKG - N/A Stress Test - N/A ECHO - 07/03/21 in [facility] Cardiac Cath - N/A Pacemaker/ICD device last checked: N/A Pacemaker orders received: N/A Device Rep notified: N/A  Spinal Cord Stimulator: N/A  Sleep Study - N/A CPAP - N/A  Fasting Blood Sugar - N/A Checks Blood Sugar ___N/A__ times a day Date and result of last Hgb A1c-N/A  Last dose of GLP1 agonist- N/A GLP1 instructions: Hold 7 days prior to schedule (Hold 24 hours-daily)   Last dose of SGLT-2 inhibitors- N/A SGLT-2 instructions: Hold 72 hours prior to surgery  Blood Thinner Instructions:N/A Last Dose:N/A Time last taken:N/A  Aspirin  Instructions:N/A Last Dose:N/A Time last taken:N/A  Activity level:  Able to exercise without chest pain and/or shortness of breath    Anesthesia review: N/A  Patient denies shortness of breath, fever, cough and chest pain at PAT appointment   Patient verbalized understanding of instructions that were reviewed over the telephone.

## 2024-05-09 NOTE — Progress Notes (Signed)
 Sent message, via epic in basket, requesting orders in epic from Careers adviser.

## 2024-05-09 NOTE — H&P (Signed)
 Nicole Fleming is an 63 y.o. female.   Chief Complaint: right wrist fracture HPI: Nicole Fleming is a 63 year old woman who works at [employer].  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  She suffered a displaced fracture.  She underwent a closed reduction and is here through [employer] for management.    Imaging/Tests: I reviewed some outside films then took some post reduction films here today.  She has a distal radius fracture which is currently angulated about 20.  The ulna appears intact.  Past Medical History:  Diagnosis Date   Abnormal pap 08/18/1998   h/o LEEP   Anxiety    Asthma    Complication of anesthesia    Cough and vomiting case had to be stopped 08-14-23   Contact lens/glasses fitting    wears contacts or glasses   Depression    and anxiety   Hemorrhoids, external    Hiatal hernia    History of palpitations    not currently   IBS (irritable bowel syndrome)    questionable gluten allergy   Mitral valve prolapse 08/18/2004   at 63 yo, echo 2013-no MVP-normal valve-no regurg   Schatzki's ring 08/18/2005   of esophagus    Past Surgical History:  Procedure Laterality Date   CESAREAN SECTION  09/21/1988   CESAREAN SECTION  10/11/1990   CHOLECYSTECTOMY  12/16/2005   ESOPHAGEAL DILATION  08/18/2005   ESOPHAGEAL MANOMETRY N/A 07/23/2022   Procedure: ESOPHAGEAL MANOMETRY (EM);  Surgeon: Eda Iha, MD;  Location: [facility];  Service: Gastroenterology;  Laterality: N/A;   HEMORRHOID SURGERY N/A 03/01/2013   Procedure: HEMORRHOIDECTOMY;  Surgeon: Donnice POUR. Belinda, MD;  Location: [facility];  Service: General;  Laterality: N/A;   HEMORRHOID SURGERY N/A 08/14/2023   Procedure: ATTEMPTED SINGLE COLUMN HEMORRHOIDECTOMY;  Surgeon: Debby Hila, MD;  Location: [facility];  Service: General;  Laterality: N/A;   HEMORRHOID SURGERY N/A 10/09/2023   Procedure:  SINGLE COLUMN HEMORRHOIDECTOMY;  Surgeon: Debby Hila, MD;  Location: [facility];  Service: General;  Laterality: N/A;   LEEP  08/18/1997   lipoma removal  10/29/2011   abdominal wall   SKIN BIOPSY Left 03/01/2013   Procedure: SKIN BIOPSY  X 2  ABDOMINAL WALL;  Surgeon: Donnice POUR. Belinda, MD;  Location: [facility];  Service: General;  Laterality: Left;   TUBAL LIGATION     XI ROBOTIC ASSISTED HIATAL HERNIA REPAIR N/A 08/05/2022   Procedure: ROBOTIC HIATAL HERNIA REPAIR WITH MESH AND FUNDOPLICATION;  Surgeon: Rubin Calamity, MD;  Location: [facility];  Service: General;  Laterality: N/A;    Family History  Problem Relation Age of Onset   Heart disease Father 99       cardiac arrest/mitral valve replaced   Colon cancer Neg Hx    Colon polyps Neg Hx    Esophageal cancer Neg Hx    Stomach cancer Neg Hx    Rectal cancer Neg Hx    Breast cancer Neg Hx    Social History:  reports that she has never smoked. She has never used smokeless tobacco. She reports that she does not drink alcohol and does not use drugs.  Allergies:  Allergies  Allergen Reactions   Grape (Artificial) Flavoring Agent (Non-Screening) Hives    Used in liquid benadryl     No medications prior to admission.    No results found for this or any  previous visit (from the past 48 hours). No results found.  Review of Systems  Musculoskeletal:  Positive for arthralgias.       Right wrist  All other systems reviewed and are negative.   Height 5' 8 (1.727 m), weight 72.6 kg, last menstrual period 11/26/2011. Physical Exam Constitutional:      Appearance: Normal appearance.  HENT:     Head: Normocephalic and atraumatic.     Nose: Nose normal.     Mouth/Throat:     Pharynx: Oropharynx is clear.  Eyes:     Extraocular Movements: Extraocular movements intact.  Pulmonary:     Effort: Pulmonary effort is normal.  Abdominal:     Palpations: Abdomen is soft.  Musculoskeletal:     Cervical back: Normal  range of motion.     Comments: Right arm is in a well fitting splint.  She has good motion of her fingers and intact sensation.  She has good refill.    Skin:    General: Skin is warm and dry.  Neurological:     General: No focal deficit present.     Mental Status: She is alert and oriented to person, place, and time. Mental status is at baseline.  Psychiatric:        Mood and Affect: Mood normal.        Behavior: Behavior normal.        Thought Content: Thought content normal.        Judgment: Judgment normal.      Assessment/Plan Assessment:   Right distal radius fracture  Plan: Felesha has a fracture which is going to be best managed surgically with ORIF.  Hopefully we can accomplish that in the near future.  I reviewed risk of anesthesia, infection, neurovascular injury.    Prentice [place]le Wiler, PA-C 05/09/2024, 5:07 PM

## 2024-05-10 ENCOUNTER — Ambulatory Visit (HOSPITAL_COMMUNITY)
Admission: RE | Admit: 2024-05-10 | Discharge: 2024-05-10 | Disposition: A | Discharge status: Home / Self Care | Payer: Worker's Compensation | Attending: Orthopaedic Surgery | Admitting: Orthopaedic Surgery

## 2024-05-10 ENCOUNTER — Ambulatory Visit (HOSPITAL_BASED_OUTPATIENT_CLINIC_OR_DEPARTMENT_OTHER): Payer: Worker's Compensation

## 2024-05-10 ENCOUNTER — Ambulatory Visit (HOSPITAL_COMMUNITY): Payer: Worker's Compensation

## 2024-05-10 ENCOUNTER — Encounter (HOSPITAL_COMMUNITY)
Admission: RE | Disposition: A | Discharge status: Home / Self Care | Payer: Self-pay | Source: Home / Self Care | Attending: Orthopaedic Surgery

## 2024-05-10 ENCOUNTER — Encounter (HOSPITAL_COMMUNITY): Payer: Self-pay | Admitting: Orthopaedic Surgery

## 2024-05-10 DIAGNOSIS — S52591A Other fractures of lower end of right radius, initial encounter for closed fracture: Secondary | ICD-10-CM | POA: Insufficient documentation

## 2024-05-10 DIAGNOSIS — K449 Diaphragmatic hernia without obstruction or gangrene: Secondary | ICD-10-CM | POA: Insufficient documentation

## 2024-05-10 DIAGNOSIS — I2510 Atherosclerotic heart disease of native coronary artery without angina pectoris: Secondary | ICD-10-CM | POA: Insufficient documentation

## 2024-05-10 DIAGNOSIS — S52501A Unspecified fracture of the lower end of right radius, initial encounter for closed fracture: Secondary | ICD-10-CM | POA: Diagnosis not present

## 2024-05-10 DIAGNOSIS — K219 Gastro-esophageal reflux disease without esophagitis: Secondary | ICD-10-CM | POA: Diagnosis not present

## 2024-05-10 DIAGNOSIS — F32A Depression, unspecified: Secondary | ICD-10-CM | POA: Insufficient documentation

## 2024-05-10 DIAGNOSIS — W010XXA Fall on same level from slipping, tripping and stumbling without subsequent striking against object, initial encounter: Secondary | ICD-10-CM | POA: Insufficient documentation

## 2024-05-10 DIAGNOSIS — J45909 Unspecified asthma, uncomplicated: Secondary | ICD-10-CM | POA: Diagnosis not present

## 2024-05-10 DIAGNOSIS — F419 Anxiety disorder, unspecified: Secondary | ICD-10-CM | POA: Insufficient documentation

## 2024-05-10 HISTORY — PX: OPEN REDUCTION INTERNAL FIXATION (ORIF) DISTAL RADIAL FRACTURE: SHX5989

## 2024-05-10 SURGERY — OPEN REDUCTION INTERNAL FIXATION (ORIF) DISTAL RADIUS FRACTURE
Anesthesia: Monitor Anesthesia Care | Laterality: Right

## 2024-05-10 MED ORDER — MIDAZOLAM HCL 2 MG/2ML IJ SOLN
INTRAMUSCULAR | Status: AC
  Filled 2024-05-10: qty 2

## 2024-05-10 MED ORDER — KETOROLAC TROMETHAMINE 30 MG/ML IJ SOLN
INTRAMUSCULAR | Status: AC
  Filled 2024-05-10: qty 1

## 2024-05-10 MED ORDER — ONDANSETRON HCL 4 MG/2ML IJ SOLN: 4.0000 mg | Freq: Once | INTRAMUSCULAR | Status: DC | PRN

## 2024-05-10 MED ORDER — DEXAMETHASONE SODIUM PHOSPHATE 10 MG/ML IJ SOLN
INTRAMUSCULAR | Status: DC | PRN
  Administered 2024-05-10: 5 mg via INTRAVENOUS

## 2024-05-10 MED ORDER — FENTANYL CITRATE (PF) 100 MCG/2ML IJ SOLN
INTRAMUSCULAR | Status: AC
  Filled 2024-05-10: qty 2

## 2024-05-10 MED ORDER — ROPIVACAINE HCL 5 MG/ML IJ SOLN
INTRAMUSCULAR | Status: DC | PRN
  Administered 2024-05-10: 30 mL via PERINEURAL

## 2024-05-10 MED ORDER — ACETAMINOPHEN 500 MG PO TABS
1000.0000 mg | ORAL_TABLET | Freq: Once | ORAL | Status: DC
  Filled 2024-05-10: qty 2

## 2024-05-10 MED ORDER — 0.9 % SODIUM CHLORIDE (POUR BTL) OPTIME
TOPICAL | Status: DC | PRN
  Administered 2024-05-10: 1000 mL

## 2024-05-10 MED ORDER — MIDAZOLAM HCL 2 MG/2ML IJ SOLN
1.0000 mg | Freq: Once | INTRAMUSCULAR | Status: AC
  Administered 2024-05-10: 2 mg via INTRAVENOUS

## 2024-05-10 MED ORDER — CEFAZOLIN SODIUM-DEXTROSE 2-4 GM/100ML-% IV SOLN
2.0000 g | INTRAVENOUS | Status: AC
Start: 2024-05-10 — End: 2024-05-10
  Administered 2024-05-10: 2 g via INTRAVENOUS
  Filled 2024-05-10: qty 100

## 2024-05-10 MED ORDER — ONDANSETRON HCL 4 MG/2ML IJ SOLN
INTRAMUSCULAR | Status: DC | PRN
  Administered 2024-05-10: 4 mg via INTRAVENOUS

## 2024-05-10 MED ORDER — FENTANYL CITRATE PF 50 MCG/ML IJ SOSY
PREFILLED_SYRINGE | INTRAMUSCULAR | Status: AC
  Filled 2024-05-10: qty 2

## 2024-05-10 MED ORDER — LACTATED RINGERS IV SOLN
INTRAVENOUS | Status: DC
Start: 2024-05-10 — End: 2024-05-10

## 2024-05-10 MED ORDER — BUPIVACAINE-EPINEPHRINE (PF) 0.25% -1:200000 IJ SOLN
INTRAMUSCULAR | Status: AC
  Filled 2024-05-10: qty 30

## 2024-05-10 MED ORDER — PHENYLEPHRINE 80 MCG/ML (10ML) SYRINGE FOR IV PUSH (FOR BLOOD PRESSURE SUPPORT)
PREFILLED_SYRINGE | INTRAVENOUS | Status: AC
  Filled 2024-05-10: qty 10

## 2024-05-10 MED ORDER — PROPOFOL 1000 MG/100ML IV EMUL
INTRAVENOUS | Status: AC
  Filled 2024-05-10: qty 100

## 2024-05-10 MED ORDER — ONDANSETRON HCL 4 MG/2ML IJ SOLN
INTRAMUSCULAR | Status: AC
  Filled 2024-05-10: qty 2

## 2024-05-10 MED ORDER — CLONIDINE HCL (ANALGESIA) 100 MCG/ML EP SOLN
EPIDURAL | Status: DC | PRN
  Administered 2024-05-10: 50 ug

## 2024-05-10 MED ORDER — DEXAMETHASONE SODIUM PHOSPHATE 10 MG/ML IJ SOLN
INTRAMUSCULAR | Status: AC
  Filled 2024-05-10: qty 1

## 2024-05-10 MED ORDER — EPHEDRINE SULFATE-NACL 50-0.9 MG/10ML-% IV SOSY
PREFILLED_SYRINGE | INTRAVENOUS | Status: DC | PRN
  Administered 2024-05-10 (×2): 2.5 mg via INTRAVENOUS

## 2024-05-10 MED ORDER — FENTANYL CITRATE PF 50 MCG/ML IJ SOSY: 25.0000 ug | PREFILLED_SYRINGE | INTRAMUSCULAR | Status: DC | PRN

## 2024-05-10 MED ORDER — ORAL CARE MOUTH RINSE: 15.0000 mL | Freq: Once | OROMUCOSAL | Status: AC

## 2024-05-10 MED ORDER — AMISULPRIDE (ANTIEMETIC) 5 MG/2ML IV SOLN: 10.0000 mg | Freq: Once | INTRAVENOUS | Status: DC | PRN

## 2024-05-10 MED ORDER — PHENYLEPHRINE 80 MCG/ML (10ML) SYRINGE FOR IV PUSH (FOR BLOOD PRESSURE SUPPORT)
PREFILLED_SYRINGE | INTRAVENOUS | Status: DC | PRN
  Administered 2024-05-10 (×2): 40 ug via INTRAVENOUS
  Administered 2024-05-10 (×2): 80 ug via INTRAVENOUS

## 2024-05-10 MED ORDER — FENTANYL CITRATE PF 50 MCG/ML IJ SOSY
50.0000 ug | PREFILLED_SYRINGE | Freq: Once | INTRAMUSCULAR | Status: AC
  Administered 2024-05-10: 50 ug via INTRAVENOUS

## 2024-05-10 MED ORDER — EPHEDRINE 5 MG/ML INJ
INTRAVENOUS | Status: AC
  Filled 2024-05-10: qty 5

## 2024-05-10 MED ORDER — HYDROCODONE-ACETAMINOPHEN 5-325 MG PO TABS: 1.0000 | ORAL_TABLET | Freq: Four times a day (QID) | ORAL | 0 refills | Status: DC | PRN

## 2024-05-10 MED ORDER — PROPOFOL 10 MG/ML IV BOLUS
INTRAVENOUS | Status: DC | PRN
  Administered 2024-05-10: 100 ug/kg/min via INTRAVENOUS
  Administered 2024-05-10: 40 mg via INTRAVENOUS

## 2024-05-10 MED ORDER — CHLORHEXIDINE GLUCONATE 0.12 % MT SOLN
15.0000 mL | Freq: Once | OROMUCOSAL | Status: AC
  Administered 2024-05-10: 15 mL via OROMUCOSAL

## 2024-05-10 SURGICAL SUPPLY — 40 items
BAG COUNTER SPONGE SURGICOUNT (BAG) IMPLANT
BIT DRILL 2.2 SS TIBIAL (BIT) IMPLANT
BNDG ELASTIC 4INX 5YD STR LF (GAUZE/BANDAGES/DRESSINGS) IMPLANT
COVER MAYO STAND STRL (DRAPES) ×1 IMPLANT
COVER SURGICAL LIGHT HANDLE (MISCELLANEOUS) ×1 IMPLANT
CUFF TOURN SGL QUICK 18X4 (TOURNIQUET CUFF) IMPLANT
DRAPE C-ARM 42X120 X-RAY (DRAPES) ×1 IMPLANT
DRAPE EXTREMITY T 121X128X90 (DISPOSABLE) ×1 IMPLANT
DRAPE OEC MINIVIEW 54X84 (DRAPES) IMPLANT
DRAPE SURG 17X11 SM STRL (DRAPES) IMPLANT
DRSG EMULSION OIL 3X3 NADH (GAUZE/BANDAGES/DRESSINGS) IMPLANT
DRSG MEPILEX POST OP 4X8 (GAUZE/BANDAGES/DRESSINGS) ×1 IMPLANT
DURAPREP 26ML APPLICATOR (WOUND CARE) ×1 IMPLANT
GLOVE BIO SURGEON STRL SZ8.5 (GLOVE) ×1 IMPLANT
GLOVE BIOGEL PI IND STRL 7.0 (GLOVE) ×1 IMPLANT
GLOVE BIOGEL PI IND STRL 8 (GLOVE) ×2 IMPLANT
GLOVE SURG LX STRL 8.0 MICRO (GLOVE) ×2 IMPLANT
GLOVE SURG SYN 7.0 PF PI (GLOVE) ×1 IMPLANT
GOWN SRG XL LVL 4 BRTHBL STRL (GOWNS) ×1 IMPLANT
GOWN STRL REUS W/ TWL XL LVL3 (GOWN DISPOSABLE) ×2 IMPLANT
KIT TURNOVER KIT A (KITS) ×1 IMPLANT
KWIRE FX5X1.6XNS BN SS (WIRE) IMPLANT
PACK GENERAL/GYN (CUSTOM PROCEDURE TRAY) ×1 IMPLANT
PADDING CAST COTTON 6X4 STRL (CAST SUPPLIES) IMPLANT
PEG LOCKING SMOOTH 2.2X16 (Screw) IMPLANT
PEG LOCKING SMOOTH 2.2X18 (Peg) IMPLANT
PEG LOCKING SMOOTH 2.2X22 (Screw) IMPLANT
PENCIL SMOKE EVACUATOR (MISCELLANEOUS) IMPLANT
PLATE NARROW DVR RIGHT (Plate) IMPLANT
PROTECTOR NERVE ULNAR (MISCELLANEOUS) ×1 IMPLANT
SCREW LOCK 12X2.7X 3 LD (Screw) IMPLANT
SCREW LOCK 14X2.7X 3 LD TPR (Screw) IMPLANT
SCREW LOCKING 2.7X13MM (Screw) IMPLANT
SCREW LP NL 2.7X22MM (Screw) IMPLANT
SLING ARM IMMOBILIZER MED (SOFTGOODS) IMPLANT
SPLINT PLASTER CAST XFAST 4X15 (CAST SUPPLIES) IMPLANT
SUT MNCRL AB 3-0 PS2 18 (SUTURE) ×1 IMPLANT
SUT VIC AB 0 CT1 36 (SUTURE) ×1 IMPLANT
SUT VIC AB 1 CT1 36 (SUTURE) ×1 IMPLANT
SUT VIC AB 2-0 CT1 TAPERPNT 27 (SUTURE) ×1 IMPLANT

## 2024-05-10 NOTE — Anesthesia Procedure Notes (Signed)
 Anesthesia Regional Block: Supraclavicular block   Pre-Anesthetic Checklist: , timeout performed,  Correct Patient, Correct Site, Correct Laterality,  Correct Procedure, Correct Position, site marked,  Risks and benefits discussed,  Surgical consent,  Pre-op evaluation,  At surgeon's request and post-op pain management  Laterality: Right  Prep: chloraprep       Needles:  Injection technique: Single-shot  Needle Type: Echogenic Needle     Needle Length: 9cm  Needle Gauge: 21     Additional Needles:   Procedures:,,,, ultrasound used (permanent image in chart),,    Narrative:  Start time: 05/10/2024 9:07 AM End time: 05/10/2024 9:13 AM Injection made incrementally with aspirations every 5 mL.  Performed by: Personally  Anesthesiologist: Corinne Garnette BRAVO, MD  Additional Notes: No pain on injection. No increased resistance to injection. Injection made in 5cc increments.  Good needle visualization.  Patient tolerated procedure well.

## 2024-05-10 NOTE — Transfer of Care (Signed)
 Immediate Anesthesia Transfer of Care Note  Patient: Nicole Fleming  Procedure(s) Performed: OPEN REDUCTION INTERNAL FIXATION (ORIF) DISTAL RADIUS FRACTURE (Right)  Patient Location: PACU  Anesthesia Type:MAC and Regional  Level of Consciousness: awake  Airway & Oxygen Therapy: Patient Spontanous Breathing and Patient connected to face mask oxygen  Post-op Assessment: Report given to RN and Post -op Vital signs reviewed and stable  Post vital signs: Reviewed and stable  Last Vitals:  Vitals Value Taken Time  BP 107/72 05/10/24 11:12  Temp    Pulse 74 05/10/24 11:14  Resp 22 05/10/24 11:14  SpO2 96 % 05/10/24 11:14  Vitals shown include unfiled device data.  Last Pain:  Vitals:   05/10/24 0917  TempSrc:   PainSc: 0-No pain         Complications: No notable events documented.

## 2024-05-10 NOTE — Anesthesia Postprocedure Evaluation (Signed)
 Anesthesia Post Note  Patient: Nicole Fleming  Procedure(s) Performed: OPEN REDUCTION INTERNAL FIXATION (ORIF) DISTAL RADIUS FRACTURE (Right)     Patient location during evaluation: PACU Anesthesia Type: Regional Level of consciousness: awake and alert Pain management: pain level controlled Vital Signs Assessment: post-procedure vital signs reviewed and stable Respiratory status: spontaneous breathing, nonlabored ventilation, respiratory function stable and patient connected to nasal cannula oxygen Cardiovascular status: stable and blood pressure returned to baseline Postop Assessment: no apparent nausea or vomiting Anesthetic complications: no   No notable events documented.  Last Vitals:  Vitals:   05/10/24 1150 05/10/24 1200  BP: 116/83 110/84  Pulse: 77 64  Resp: 16   Temp: (!) 36.4 C   SpO2: 97% 99%    Last Pain:  Vitals:   05/10/24 1150  TempSrc: Oral  PainSc: 0-No pain                 Garnette FORBES Skillern

## 2024-05-10 NOTE — Brief Op Note (Signed)
 Nicole Fleming [id number] 05/10/2024   PRE-OP DIAGNOSIS: right distal radius fracture  POST-OP DIAGNOSIS: same  PROCEDURE: right distal radius ORIF  ANESTHESIA: block and MAC  [name]   Dictation #:  [id number]

## 2024-05-10 NOTE — Interval H&P Note (Signed)
 History and Physical Interval Note:  05/10/2024 9:05 AM  Nicole Fleming  has presented today for surgery, with the diagnosis of RIGHT WRIST DISTAL RADIUS FRACTURE.  The various methods of treatment have been discussed with the patient and family. After consideration of risks, benefits and other options for treatment, the patient has consented to  Procedure(s): OPEN REDUCTION INTERNAL FIXATION (ORIF) DISTAL RADIUS FRACTURE (Right) as a surgical intervention.  The patient's history has been reviewed, patient examined, no change in status, stable for surgery.  I have reviewed the patient's chart and labs.  Questions were answered to the patient's satisfaction.     Ismaeel Arvelo G Jahmez Bily

## 2024-05-10 NOTE — Anesthesia Preprocedure Evaluation (Signed)
 Anesthesia Evaluation  Patient identified by MRN, date of birth, ID band Patient awake    Reviewed: Allergy & Precautions, NPO status , Patient's Chart, lab work & pertinent test results  Airway Mallampati: II  TM Distance: >3 FB Neck ROM: Full    Dental  (+) Teeth Intact, Dental Advisory Given   Pulmonary asthma    Pulmonary exam normal breath sounds clear to auscultation       Cardiovascular + CAD  Normal cardiovascular exam+ Valvular Problems/Murmurs MVP  Rhythm:Regular Rate:Normal     Neuro/Psych  PSYCHIATRIC DISORDERS Anxiety Depression    negative neurological ROS     GI/Hepatic Neg liver ROS, hiatal hernia,GERD  ,,  Endo/Other  negative endocrine ROS    Renal/GU negative Renal ROS     Musculoskeletal RIGHT WRIST DISTAL RADIUS FRACTURE   Abdominal   Peds  Hematology   Anesthesia Other Findings Day of surgery medications reviewed with the patient.  Reproductive/Obstetrics                              Anesthesia Physical Anesthesia Plan  ASA: 2  Anesthesia Plan: Regional and MAC   Post-op Pain Management: Regional block*, Tylenol  PO (pre-op)* and Toradol  IV (intra-op)*   Induction: Intravenous  PONV Risk Score and Plan: 2 and Midazolam , TIVA, Dexamethasone  and Ondansetron   Airway Management Planned: Simple Face Mask and Natural Airway  Additional Equipment:   Intra-op Plan:   Post-operative Plan:   Informed Consent: I have reviewed the patients History and Physical, chart, labs and discussed the procedure including the risks, benefits and alternatives for the proposed anesthesia with the patient or authorized representative who has indicated his/her understanding and acceptance.     Dental advisory given  Plan Discussed with: CRNA  Anesthesia Plan Comments:          Anesthesia Quick Evaluation

## 2024-05-10 NOTE — Op Note (Unsigned)
 NAME: Nicole Fleming, Nicole Fleming MEDICAL RECORD NO: [id number] ACCOUNT NO: [id number] DATE OF BIRTH: [birth year] FACILITY: [facility] LOCATION: [facility] PHYSICIAN: Maude MATSU. Sheril, MD  Operative Report   DATE OF PROCEDURE: 05/10/2024  PREOPERATIVE DIAGNOSIS:  Right distal radius fracture.  POSTOPERATIVE DIAGNOSIS:  Right distal radius fracture.  PROCEDURE PERFORMED:  Right distal radius ORIF.  ANESTHESIA:  Block and sedation.  ATTENDING SURGEON:  Maude MATSU. Shellyann Wandrey, MD.  ASSISTANTBETHA Prentice Clock, PA.  INDICATION FOR PROCEDURE:  The patient is a 63 year old woman who fell several days ago on the job and suffered a displaced right distal radius fracture, which was mildly comminuted.  She underwent a closed reduction in the ER and was seen in the office  yesterday. She had experienced some significant angulation and was offered ORIF in hopes of stabilizing her fracture.  Informed operative consent was obtained after discussion of possible complications including reaction with anesthesia, infection,  neurovascular injury.  SUMMARY OF FINDINGS AND PROCEDURE:  Under block and sedation, a right wrist ORIF was performed. We used fluoroscopic guidance and the DVR Crosslock set by [employer].  Prentice Clock assisted throughout and was invaluable to the completion of the case  and that he maintained reduction while I placed hardware. He also closed simultaneously to help minimize OR time.  She was scheduled to go home the same day.  DESCRIPTION OF PROCEDURE:  The patient was seen in the operating suite where a block was applied. She was given some sedation and positioned supine.  After appropriate timeout and prep and drape, a tourniquet was inflated about her upper arm.  We made a  volar longitudinal incision centered on the radius with dissection down to that bone. We found her fracture and reduced the fracture in a fairly anatomic position.  We then used a narrow short DVR Crosslock plate by [employer] and  placed that on the  bone.  This was adjusted slightly under fluoroscopic guidance.  We placed 3 proximal screws and 8 or 9 distal screws, one of which was threaded and the rest of which were pegs.  This gave us  a nice anatomic reduction. Her wrist was examined under  fluoroscopic guidance and the hardware was found to be placed appropriately and our construct was quite stable. She did have 1 intraarticular split, which we reduced well. The wound was then irrigated followed by release of the tourniquet.  A small  amount of bleeding was easily controlled with Bovie cautery and pressure.  Her hand became pink and warm immediately. We irrigated again followed by closure of deep tissues with Vicryl and skin with a running subcuticular stitch. We placed some  Steri-Strips and a sterile dressing followed by a volar splint of plaster with the wrist in slight extension.  Estimated blood loss and intraoperative fluids as well as accurate tourniquet time can be obtained from anesthesia records.  DISPOSITION: The patient was taken to the recovery room in stable condition.  Plans were for her to go home same day and follow up in the office less than a week.  I will contact her by phone tonight.   NIK D: 05/10/2024 10:58:00 am T: 05/10/2024 11:17:00 am  JOB: [id number]/ [id number]

## 2024-05-11 ENCOUNTER — Encounter (HOSPITAL_COMMUNITY): Payer: Self-pay | Admitting: Orthopaedic Surgery

## 2024-06-09 ENCOUNTER — Encounter (HOSPITAL_BASED_OUTPATIENT_CLINIC_OR_DEPARTMENT_OTHER): Payer: Self-pay

## 2024-06-09 ENCOUNTER — Ambulatory Visit (INDEPENDENT_AMBULATORY_CARE_PROVIDER_SITE_OTHER)

## 2024-06-09 VITALS — BP 134/83 | HR 68 | Ht 68.0 in | Wt 173.0 lb

## 2024-06-09 DIAGNOSIS — R8290 Unspecified abnormal findings in urine: Secondary | ICD-10-CM

## 2024-06-09 DIAGNOSIS — R350 Frequency of micturition: Secondary | ICD-10-CM

## 2024-06-09 LAB — POCT URINALYSIS DIPSTICK
Bilirubin, UA: NEGATIVE
Glucose, UA: NEGATIVE
Ketones, UA: NEGATIVE
Nitrite, UA: NEGATIVE
Protein, UA: POSITIVE — AB
Spec Grav, UA: >=1.03 — AB (ref 1.010–1.025)
Urobilinogen, UA: 0.2 U/dL
pH, UA: 5 (ref 5.0–8.0)

## 2024-06-09 MED ORDER — NITROFURANTOIN MONOHYD MACRO 100 MG PO CAPS: 100.0000 mg | ORAL_CAPSULE | Freq: Two times a day (BID) | ORAL | 0 refills | Status: DC

## 2024-06-09 MED ORDER — PHENAZOPYRIDINE HCL 200 MG PO TABS: 200.0000 mg | ORAL_TABLET | Freq: Three times a day (TID) | ORAL | 0 refills | Status: DC | PRN

## 2024-06-09 NOTE — Progress Notes (Signed)
 NURSE VISIT- UTI SYMPTOMS   SUBJECTIVE:  Nicole Fleming is a 63 y.o. G2P2 female here for UTI symptoms. She is a GYN patient. She reports cloudy urine and urinary frequency.  OBJECTIVE:  LMP 11/26/2011   Appears well, in no apparent distress  No results found for this or any previous visit (from the past 24 hours).  ASSESSMENT: GYN patient with UTI symptoms and negative nitrites  PLAN: Visit routed to or discussed with:  Rx sent today: Yes Urine culture sent Call or return to clinic prn if these symptoms worsen or fail to improve as anticipated. Follow-up: as needed

## 2024-06-09 NOTE — Addendum Note (Signed)
 Addended by: DARRYLE PRESIDENT B on: 06/09/2024 10:13 AM   Modules accepted: Orders

## 2024-06-13 ENCOUNTER — Ambulatory Visit (HOSPITAL_BASED_OUTPATIENT_CLINIC_OR_DEPARTMENT_OTHER): Payer: Self-pay | Admitting: Certified Nurse Midwife

## 2024-06-13 LAB — URINE CULTURE

## 2024-06-23 ENCOUNTER — Ambulatory Visit (HOSPITAL_BASED_OUTPATIENT_CLINIC_OR_DEPARTMENT_OTHER): Payer: BC Managed Care – PPO | Admitting: Obstetrics & Gynecology

## 2024-06-23 ENCOUNTER — Encounter (HOSPITAL_BASED_OUTPATIENT_CLINIC_OR_DEPARTMENT_OTHER): Payer: Self-pay | Admitting: Obstetrics & Gynecology

## 2024-06-23 ENCOUNTER — Other Ambulatory Visit (HOSPITAL_COMMUNITY)
Admission: RE | Admit: 2024-06-23 | Discharge: 2024-06-23 | Disposition: A | Discharge status: Home / Self Care | Source: Ambulatory Visit | Attending: Obstetrics & Gynecology | Admitting: Obstetrics & Gynecology

## 2024-06-23 VITALS — BP 138/87 | HR 70 | Ht 68.0 in | Wt 174.0 lb

## 2024-06-23 DIAGNOSIS — Z124 Encounter for screening for malignant neoplasm of cervix: Secondary | ICD-10-CM

## 2024-06-23 DIAGNOSIS — Z1231 Encounter for screening mammogram for malignant neoplasm of breast: Secondary | ICD-10-CM

## 2024-06-23 DIAGNOSIS — Z1151 Encounter for screening for human papillomavirus (HPV): Secondary | ICD-10-CM

## 2024-06-23 DIAGNOSIS — Z01419 Encounter for gynecological examination (general) (routine) without abnormal findings: Secondary | ICD-10-CM

## 2024-06-23 DIAGNOSIS — Z658 Other specified problems related to psychosocial circumstances: Secondary | ICD-10-CM | POA: Diagnosis not present

## 2024-06-23 DIAGNOSIS — Z1331 Encounter for screening for depression: Secondary | ICD-10-CM

## 2024-06-23 DIAGNOSIS — R8290 Unspecified abnormal findings in urine: Secondary | ICD-10-CM

## 2024-06-23 LAB — POCT URINALYSIS DIP (CLINITEK)
Bilirubin, UA: NEGATIVE
Blood, UA: NEGATIVE
Glucose, UA: NEGATIVE mg/dL
Ketones, POC UA: NEGATIVE mg/dL
Nitrite, UA: NEGATIVE
POC PROTEIN,UA: NEGATIVE
Spec Grav, UA: 1.01 (ref 1.010–1.025)
Urobilinogen, UA: 0.2 U/dL
pH, UA: 7 (ref 5.0–8.0)

## 2024-06-23 MED ORDER — CITALOPRAM HYDROBROMIDE 20 MG PO TABS: 20.0000 mg | ORAL_TABLET | Freq: Every day | ORAL | 1 refills | Status: DC

## 2024-06-23 MED ORDER — SULFAMETHOXAZOLE-TRIMETHOPRIM 800-160 MG PO TABS: 1.0000 | ORAL_TABLET | Freq: Two times a day (BID) | ORAL | 0 refills | Status: AC

## 2024-06-23 NOTE — Progress Notes (Addendum)
 ANNUAL EXAM Patient name: Nicole Fleming MRN [id number]  Date of birth: 1960/11/26 Chief Complaint:   Gynecologic Exam  History of Present Illness:   Nicole Fleming is a 63 y.o. G2P2 Caucasian female being seen today for a routine annual exam.   Had broken wrist when she at [employer].  They were on vacation.  Had surgery after she got home.    Pt reports that about two weeks ago she had a UTI. She has completed Macrobid  and complains of no further issues but does report that urine still appears cloudy and would like for us  to run it.   Denies vaginal bleeding.    Patient's last menstrual period was 11/26/2011.  Last pap 06/23/2023. Results were: NILM w/ HRHPV positive: type not specified. H/O abnormal pap: yes Last mammogram: 07/21/2023. Results were: normal. Family h/o breast cancer: no Last colonoscopy: Declined colonoscopy and cologuard.  Family h/o colorectal cancer: no Dexa:  has been ordered but not done.  Declines doing this as well right now.        06/23/2024    3:52 PM 07/30/2023    3:46 PM 06/23/2023   12:08 PM 06/16/2022    4:27 PM 03/17/2022    2:08 PM  Depression screen PHQ 2/9  Decreased Interest 0 0 0 0 0  Down, Depressed, Hopeless 0 0 0 0 0  PHQ - 2 Score 0 0 0 0 0  Altered sleeping     0  Tired, decreased energy     0  Change in appetite     0  Feeling bad or failure about yourself      0  Trouble concentrating     0  Moving slowly or fidgety/restless     0  Suicidal thoughts     0  PHQ-9 Score     0   Difficult doing work/chores     Not difficult at all     Data saved with a previous flowsheet row definition    Review of Systems:   Pertinent items are noted in HPI Denies any bowel changes.  Urinary issues as per above.  Denies pelvic pain.   Pertinent History Reviewed:  Reviewed past medical,surgical, social and family history.  Reviewed problem list, medications and allergies. Physical Assessment:   Vitals:   06/23/24 1551  BP: 138/87   Pulse: 70  SpO2: 98%  Weight: 174 lb (78.9 kg)  Height: 5' 8 (1.727 m)  Body mass index is 26.46 kg/m.        Physical Examination:   General appearance - well appearing, and in no distress  Mental status - alert, oriented to person, place, and time  Psych:  She has a normal mood and affect  Skin - warm and dry, normal color, no suspicious lesions noted  Chest - effort normal, all lung fields clear to auscultation bilaterally  Heart - normal rate and regular rhythm  Neck:  midline trachea, no thyromegaly or nodules  Breasts - breasts appear normal, no suspicious masses, no skin or nipple changes or  axillary nodes  Abdomen - soft, nontender, nondistended, no masses or organomegaly  Pelvic - VULVA: normal appearing vulva with no masses, tenderness or lesions   VAGINA: normal appearing vagina with normal color and discharge, no lesions   CERVIX: normal appearing cervix without discharge or lesions, no CMT  Thin prep pap is done with HR HPV cotesting  UTERUS: uterus is felt to be normal size, shape, consistency and  nontender   ADNEXA: No adnexal masses or tenderness noted.  Rectal - normal rectal, good sphincter tone, no masses felt.   Extremities:  No swelling or varicosities noted  Chaperone present for exam  No results found for this or any previous visit (from the past 24 hours).   Assessment & Plan:  1. Well woman exam with routine gynecological exam (Primary) - Pap smear with HR HPV obtained today - Mammogram 07/22/2024.  Order placed.   - Colonoscopy declined.  Cologuard declined. - Bone mineral density declined.   - lab work done with PCP, Camie Early - vaccines reviewed/updated  2. Cervical cancer screening - Cytology - PAP( [place])  3. Psychosocial stressors - will continue citalopram  and needs RF.  Citalopram  (CELEXA ) 20 MG tablet; Take 1 tablet (20 mg total) by mouth daily.  Dispense: 30 tablet; Refill: 1  4. Encounter for screening mammogram for malignant  neoplasm of breast - MM 3D SCREENING MAMMOGRAM BILATERAL BREAST; Future  5. Cloudy urine - will recheck urine culture as POCT does not definitively show a UTI.   - Urine Culture - Rx for bactrim  DS BID x 5 days to pharmacy if feels needs to take while waiting for culture results.   Orders Placed This Encounter  Procedures   Urine Culture   MM 3D SCREENING MAMMOGRAM BILATERAL BREAST   POCT URINALYSIS DIP (CLINITEK)    Meds:  Meds ordered this encounter  Medications   sulfamethoxazole -trimethoprim  (BACTRIM  DS) 800-160 MG tablet    Sig: Take 1 tablet by mouth 2 (two) times daily.    Dispense:  10 tablet    Refill:  0   citalopram  (CELEXA ) 20 MG tablet    Sig: Take 1 tablet (20 mg total) by mouth daily.    Dispense:  30 tablet    Refill:  1    Follow-up: Return in about 1 year (around 06/23/2025).  Ronal GORMAN Pinal, MD 06/25/2024 5:15 PM

## 2024-06-26 LAB — URINE CULTURE

## 2024-06-29 ENCOUNTER — Ambulatory Visit (HOSPITAL_BASED_OUTPATIENT_CLINIC_OR_DEPARTMENT_OTHER): Payer: Self-pay | Admitting: Obstetrics & Gynecology

## 2024-06-29 DIAGNOSIS — N390 Urinary tract infection, site not specified: Secondary | ICD-10-CM

## 2024-06-29 MED ORDER — FLUCONAZOLE 150 MG PO TABS: 150.0000 mg | ORAL_TABLET | Freq: Once | ORAL | 0 refills | Status: AC

## 2024-06-29 MED ORDER — AMOXICILLIN 500 MG PO CAPS: 500.0000 mg | ORAL_CAPSULE | Freq: Three times a day (TID) | ORAL | 0 refills | Status: AC

## 2024-07-01 LAB — CYTOLOGY - PAP
Comment: NEGATIVE
Comment: NEGATIVE
Comment: NEGATIVE
Diagnosis: UNDETERMINED — AB
HPV 16: NEGATIVE
HPV 18 / 45: NEGATIVE
High risk HPV: POSITIVE — AB

## 2024-07-15 ENCOUNTER — Encounter (HOSPITAL_BASED_OUTPATIENT_CLINIC_OR_DEPARTMENT_OTHER): Payer: Self-pay | Admitting: Obstetrics & Gynecology

## 2024-08-03 ENCOUNTER — Inpatient Hospital Stay
Admission: RE | Admit: 2024-08-03 | Discharge: 2024-08-03 | Attending: Obstetrics & Gynecology | Admitting: Obstetrics & Gynecology

## 2024-08-03 DIAGNOSIS — Z1231 Encounter for screening mammogram for malignant neoplasm of breast: Secondary | ICD-10-CM

## 2024-08-08 ENCOUNTER — Other Ambulatory Visit (HOSPITAL_BASED_OUTPATIENT_CLINIC_OR_DEPARTMENT_OTHER): Payer: Self-pay

## 2024-08-08 DIAGNOSIS — Z658 Other specified problems related to psychosocial circumstances: Secondary | ICD-10-CM

## 2024-08-08 MED ORDER — CITALOPRAM HYDROBROMIDE 20 MG PO TABS: 20.0000 mg | ORAL_TABLET | Freq: Every day | ORAL | 1 refills | Status: AC

## 2024-08-30 ENCOUNTER — Encounter (HOSPITAL_BASED_OUTPATIENT_CLINIC_OR_DEPARTMENT_OTHER): Payer: Self-pay | Admitting: Obstetrics & Gynecology

## 2024-09-19 ENCOUNTER — Ambulatory Visit: Admitting: Cardiovascular Disease

## 2024-09-22 ENCOUNTER — Ambulatory Visit: Admitting: Podiatry

## 2024-09-22 DIAGNOSIS — M67471 Ganglion, right ankle and foot: Secondary | ICD-10-CM

## 2024-09-22 NOTE — Progress Notes (Unsigned)
 Gets sharp pain going down foot  Right s

## 2024-09-22 NOTE — Patient Instructions (Signed)

## 2024-10-17 ENCOUNTER — Ambulatory Visit: Admitting: Cardiovascular Disease
